# Patient Record
Sex: Female | Born: 1970 | Race: Black or African American | Hispanic: No | Smoking: Never smoker
Health system: Southern US, Community
[De-identification: ages and names within clinical notes are randomized; demographics above are authoritative.]

## PROBLEM LIST (undated history)

## (undated) DIAGNOSIS — D649 Anemia, unspecified: Secondary | ICD-10-CM

## (undated) DIAGNOSIS — I10 Essential (primary) hypertension: Secondary | ICD-10-CM

## (undated) DIAGNOSIS — Z789 Other specified health status: Secondary | ICD-10-CM

---

## 1998-01-02 HISTORY — PX: GASTRIC BYPASS: SHX52

## 2001-01-02 HISTORY — PX: HERNIA REPAIR: SHX51

## 2002-01-02 HISTORY — PX: HERNIA REPAIR: SHX51

## 2002-01-02 HISTORY — PX: VENTRAL HERNIA REPAIR: SHX424

## 2002-08-25 ENCOUNTER — Ambulatory Visit (HOSPITAL_COMMUNITY): Admission: RE | Admit: 2002-08-25 | Discharge: 2002-08-25 | Payer: Self-pay | Admitting: *Deleted

## 2002-08-29 ENCOUNTER — Encounter: Payer: Self-pay | Admitting: *Deleted

## 2002-08-29 ENCOUNTER — Ambulatory Visit (HOSPITAL_COMMUNITY): Admission: RE | Admit: 2002-08-29 | Discharge: 2002-08-29 | Payer: Self-pay | Admitting: *Deleted

## 2002-10-15 ENCOUNTER — Ambulatory Visit (HOSPITAL_COMMUNITY): Admission: RE | Admit: 2002-10-15 | Discharge: 2002-10-15 | Payer: Self-pay | Admitting: Obstetrics & Gynecology

## 2002-10-15 ENCOUNTER — Encounter: Payer: Self-pay | Admitting: Obstetrics & Gynecology

## 2003-12-04 ENCOUNTER — Ambulatory Visit: Payer: Self-pay | Admitting: Oncology

## 2005-09-14 ENCOUNTER — Encounter: Admission: RE | Admit: 2005-09-14 | Discharge: 2005-09-14 | Payer: Self-pay | Admitting: *Deleted

## 2005-11-08 ENCOUNTER — Ambulatory Visit: Payer: Self-pay | Admitting: Oncology

## 2005-11-08 LAB — CBC & DIFF AND RETIC
BASO%: 0.8 % (ref 0.0–2.0)
Basophils Absolute: 0.1 10*3/uL (ref 0.0–0.1)
EOS%: 2.4 % (ref 0.0–7.0)
Eosinophils Absolute: 0.2 10*3/uL (ref 0.0–0.5)
HCT: 28.9 % — ABNORMAL LOW (ref 34.8–46.6)
HGB: 9.3 g/dL — ABNORMAL LOW (ref 11.6–15.9)
IRF: 0.41 — ABNORMAL HIGH (ref 0.130–0.330)
LYMPH%: 33.7 % (ref 14.0–48.0)
MCH: 21.1 pg — ABNORMAL LOW (ref 26.0–34.0)
MCHC: 32.3 g/dL (ref 32.0–36.0)
MCV: 65.2 fL — ABNORMAL LOW (ref 81.0–101.0)
MONO#: 0.7 10*3/uL (ref 0.1–0.9)
MONO%: 9.3 % (ref 0.0–13.0)
NEUT#: 4.2 10*3/uL (ref 1.5–6.5)
NEUT%: 53.8 % (ref 39.6–76.8)
Platelets: 330 10*3/uL (ref 145–400)
RBC: 4.43 10*6/uL (ref 3.70–5.32)
RDW: 18.4 % — ABNORMAL HIGH (ref 11.3–14.5)
RETIC #: 107.6 10*3/uL (ref 19.7–115.1)
Retic %: 2.4 % — ABNORMAL HIGH (ref 0.4–2.3)
WBC: 7.7 10*3/uL (ref 3.9–10.0)
lymph#: 2.6 10*3/uL (ref 0.9–3.3)

## 2005-11-09 LAB — COMPREHENSIVE METABOLIC PANEL
ALT: 17 U/L (ref 0–35)
AST: 18 U/L (ref 0–37)
Albumin: 4.2 g/dL (ref 3.5–5.2)
Alkaline Phosphatase: 120 U/L — ABNORMAL HIGH (ref 39–117)
BUN: 10 mg/dL (ref 6–23)
CO2: 24 mEq/L (ref 19–32)
Calcium: 8.3 mg/dL — ABNORMAL LOW (ref 8.4–10.5)
Chloride: 106 mEq/L (ref 96–112)
Creatinine, Ser: 0.75 mg/dL (ref 0.40–1.20)
Glucose, Bld: 86 mg/dL (ref 70–99)
Potassium: 4.4 mEq/L (ref 3.5–5.3)
Sodium: 137 mEq/L (ref 135–145)
Total Bilirubin: 0.3 mg/dL (ref 0.3–1.2)
Total Protein: 7.3 g/dL (ref 6.0–8.3)

## 2005-11-09 LAB — FERRITIN: Ferritin: 3 ng/mL — ABNORMAL LOW (ref 10–291)

## 2005-11-09 LAB — LACTATE DEHYDROGENASE: LDH: 169 U/L (ref 94–250)

## 2005-11-09 LAB — VITAMIN B12: Vitamin B-12: 602 pg/mL (ref 211–911)

## 2005-11-09 LAB — FOLATE RBC: RBC Folate: 400 ng/mL (ref 180–600)

## 2006-02-02 ENCOUNTER — Ambulatory Visit: Payer: Self-pay | Admitting: Oncology

## 2006-04-20 ENCOUNTER — Ambulatory Visit: Payer: Self-pay | Admitting: Oncology

## 2006-04-25 LAB — FERRITIN: Ferritin: 6 ng/mL — ABNORMAL LOW (ref 10–291)

## 2006-04-25 LAB — CBC WITH DIFFERENTIAL/PLATELET
BASO%: 0.6 % (ref 0.0–2.0)
Basophils Absolute: 0 10*3/uL (ref 0.0–0.1)
EOS%: 4.7 % (ref 0.0–7.0)
Eosinophils Absolute: 0.3 10*3/uL (ref 0.0–0.5)
HCT: 32.4 % — ABNORMAL LOW (ref 34.8–46.6)
HGB: 11.2 g/dL — ABNORMAL LOW (ref 11.6–15.9)
LYMPH%: 32.1 % (ref 14.0–48.0)
MCH: 25.7 pg — ABNORMAL LOW (ref 26.0–34.0)
MCHC: 34.5 g/dL (ref 32.0–36.0)
MCV: 74.5 fL — ABNORMAL LOW (ref 81.0–101.0)
MONO#: 0.7 10*3/uL (ref 0.1–0.9)
MONO%: 10.2 % (ref 0.0–13.0)
NEUT#: 3.8 10*3/uL (ref 1.5–6.5)
NEUT%: 52.4 % (ref 39.6–76.8)
Platelets: 250 10*3/uL (ref 145–400)
RBC: 4.36 10*6/uL (ref 3.70–5.32)
RDW: 16.3 % — ABNORMAL HIGH (ref 11.3–14.5)
WBC: 7.3 10*3/uL (ref 3.9–10.0)
lymph#: 2.3 10*3/uL (ref 0.9–3.3)

## 2006-08-09 ENCOUNTER — Ambulatory Visit: Payer: Self-pay | Admitting: Oncology

## 2006-08-13 LAB — CBC WITH DIFFERENTIAL/PLATELET
BASO%: 0.8 % (ref 0.0–2.0)
Basophils Absolute: 0.1 10*3/uL (ref 0.0–0.1)
EOS%: 3.5 % (ref 0.0–7.0)
Eosinophils Absolute: 0.3 10*3/uL (ref 0.0–0.5)
HCT: 36.5 % (ref 34.8–46.6)
HGB: 12.9 g/dL (ref 11.6–15.9)
LYMPH%: 23 % (ref 14.0–48.0)
MCH: 28 pg (ref 26.0–34.0)
MCHC: 35.4 g/dL (ref 32.0–36.0)
MCV: 79.2 fL — ABNORMAL LOW (ref 81.0–101.0)
MONO#: 0.6 10*3/uL (ref 0.1–0.9)
MONO%: 6.7 % (ref 0.0–13.0)
NEUT#: 5.8 10*3/uL (ref 1.5–6.5)
NEUT%: 66 % (ref 39.6–76.8)
Platelets: 251 10*3/uL (ref 145–400)
RBC: 4.61 10*6/uL (ref 3.70–5.32)
RDW: 17.3 % — ABNORMAL HIGH (ref 11.3–14.5)
WBC: 8.8 10*3/uL (ref 3.9–10.0)
lymph#: 2 10*3/uL (ref 0.9–3.3)

## 2006-08-13 LAB — FERRITIN: Ferritin: 39 ng/mL (ref 10–291)

## 2006-11-05 ENCOUNTER — Ambulatory Visit: Payer: Self-pay | Admitting: Oncology

## 2006-12-20 LAB — CBC WITH DIFFERENTIAL/PLATELET
BASO%: 0.3 % (ref 0.0–2.0)
Basophils Absolute: 0 10*3/uL (ref 0.0–0.1)
EOS%: 4.2 % (ref 0.0–7.0)
Eosinophils Absolute: 0.3 10*3/uL (ref 0.0–0.5)
HCT: 36.1 % (ref 34.8–46.6)
HGB: 12.6 g/dL (ref 11.6–15.9)
LYMPH%: 29.3 % (ref 14.0–48.0)
MCH: 28.2 pg (ref 26.0–34.0)
MCHC: 35 g/dL (ref 32.0–36.0)
MCV: 80.8 fL — ABNORMAL LOW (ref 81.0–101.0)
MONO#: 0.4 10*3/uL (ref 0.1–0.9)
MONO%: 6 % (ref 0.0–13.0)
NEUT#: 3.8 10*3/uL (ref 1.5–6.5)
NEUT%: 60.2 % (ref 39.6–76.8)
Platelets: 247 10*3/uL (ref 145–400)
RBC: 4.47 10*6/uL (ref 3.70–5.32)
RDW: 14.3 % (ref 11.3–14.5)
WBC: 6.2 10*3/uL (ref 3.9–10.0)
lymph#: 1.8 10*3/uL (ref 0.9–3.3)

## 2006-12-20 LAB — FERRITIN: Ferritin: 21 ng/mL (ref 10–291)

## 2007-02-01 ENCOUNTER — Ambulatory Visit: Payer: Self-pay | Admitting: Oncology

## 2007-05-01 ENCOUNTER — Ambulatory Visit: Payer: Self-pay | Admitting: Oncology

## 2007-05-06 LAB — CBC & DIFF AND RETIC
BASO%: 1.7 % (ref 0.0–2.0)
Basophils Absolute: 0.1 10*3/uL (ref 0.0–0.1)
EOS%: 4.5 % (ref 0.0–7.0)
Eosinophils Absolute: 0.4 10*3/uL (ref 0.0–0.5)
HCT: 36.2 % (ref 34.8–46.6)
HGB: 12.3 g/dL (ref 11.6–15.9)
LYMPH%: 29.5 % (ref 14.0–48.0)
MCH: 26.4 pg (ref 26.0–34.0)
MCHC: 34.1 g/dL (ref 32.0–36.0)
MCV: 77.4 fL — ABNORMAL LOW (ref 81.0–101.0)
MONO#: 0.7 10*3/uL (ref 0.1–0.9)
MONO%: 8 % (ref 0.0–13.0)
NEUT#: 4.8 10*3/uL (ref 1.5–6.5)
NEUT%: 56.2 % (ref 39.6–76.8)
Platelets: 251 10*3/uL (ref 145–400)
RBC: 4.67 10*6/uL (ref 3.70–5.32)
RDW: 14.9 % — ABNORMAL HIGH (ref 11.3–14.5)
WBC: 8.5 10*3/uL (ref 3.9–10.0)
lymph#: 2.5 10*3/uL (ref 0.9–3.3)

## 2007-05-06 LAB — RETICULOCYTES (CHCC)
ABS Retic: 82.6 10*3/uL (ref 19.0–186.0)
RBC.: 4.86 MIL/uL (ref 3.87–5.11)
Retic Ct Pct: 1.7 % (ref 0.4–3.1)

## 2007-05-06 LAB — FERRITIN: Ferritin: 9 ng/mL — ABNORMAL LOW (ref 10–291)

## 2007-07-28 ENCOUNTER — Ambulatory Visit: Payer: Self-pay | Admitting: Oncology

## 2007-08-30 LAB — FERRITIN: Ferritin: 52 ng/mL (ref 10–291)

## 2007-08-30 LAB — CBC & DIFF AND RETIC
BASO%: 0.5 % (ref 0.0–2.0)
Basophils Absolute: 0 10*3/uL (ref 0.0–0.1)
EOS%: 5 % (ref 0.0–7.0)
Eosinophils Absolute: 0.3 10*3/uL (ref 0.0–0.5)
HCT: 38.8 % (ref 34.8–46.6)
HGB: 13.6 g/dL (ref 11.6–15.9)
IRF: 0.4 — ABNORMAL HIGH (ref 0.130–0.330)
LYMPH%: 30.1 % (ref 14.0–48.0)
MCH: 29.1 pg (ref 26.0–34.0)
MCHC: 35 g/dL (ref 32.0–36.0)
MCV: 83.1 fL (ref 81.0–101.0)
MONO#: 0.6 10*3/uL (ref 0.1–0.9)
MONO%: 8.8 % (ref 0.0–13.0)
NEUT#: 3.6 10*3/uL (ref 1.5–6.5)
NEUT%: 55.6 % (ref 39.6–76.8)
Platelets: 235 10*3/uL (ref 145–400)
RBC: 4.67 10*6/uL (ref 3.70–5.32)
RDW: 15.2 % — ABNORMAL HIGH (ref 11.3–14.5)
RETIC #: 126.6 10*3/uL — ABNORMAL HIGH (ref 19.7–115.1)
Retic %: 2.7 % — ABNORMAL HIGH (ref 0.4–2.3)
WBC: 6.5 10*3/uL (ref 3.9–10.0)
lymph#: 1.9 10*3/uL (ref 0.9–3.3)

## 2007-11-01 ENCOUNTER — Ambulatory Visit: Payer: Self-pay | Admitting: Oncology

## 2007-11-05 LAB — CBC & DIFF AND RETIC
BASO%: 0.4 % (ref 0.0–2.0)
Basophils Absolute: 0 10*3/uL (ref 0.0–0.1)
EOS%: 3.2 % (ref 0.0–7.0)
Eosinophils Absolute: 0.2 10*3/uL (ref 0.0–0.5)
HCT: 38.1 % (ref 34.8–46.6)
HGB: 13.1 g/dL (ref 11.6–15.9)
IRF: 0.44 — ABNORMAL HIGH (ref 0.130–0.330)
LYMPH%: 27.2 % (ref 14.0–48.0)
MCH: 29.1 pg (ref 26.0–34.0)
MCHC: 34.4 g/dL (ref 32.0–36.0)
MCV: 84.5 fL (ref 81.0–101.0)
MONO#: 0.7 10*3/uL (ref 0.1–0.9)
MONO%: 8.9 % (ref 0.0–13.0)
NEUT#: 4.7 10*3/uL (ref 1.5–6.5)
NEUT%: 60.3 % (ref 39.6–76.8)
Platelets: 236 10*3/uL (ref 145–400)
RBC: 4.51 10*6/uL (ref 3.70–5.32)
RDW: 14.8 % — ABNORMAL HIGH (ref 11.3–14.5)
RETIC #: 138 10*3/uL — ABNORMAL HIGH (ref 19.7–115.1)
Retic %: 3.1 % — ABNORMAL HIGH (ref 0.4–2.3)
WBC: 7.9 10*3/uL (ref 3.9–10.0)
lymph#: 2.1 10*3/uL (ref 0.9–3.3)

## 2007-11-05 LAB — FERRITIN: Ferritin: 24 ng/mL (ref 10–291)

## 2008-02-05 ENCOUNTER — Ambulatory Visit: Payer: Self-pay | Admitting: Oncology

## 2008-05-08 ENCOUNTER — Ambulatory Visit: Payer: Self-pay | Admitting: Oncology

## 2008-05-12 LAB — CBC & DIFF AND RETIC
BASO%: 0.4 % (ref 0.0–2.0)
Basophils Absolute: 0 10*3/uL (ref 0.0–0.1)
EOS%: 3 % (ref 0.0–7.0)
Eosinophils Absolute: 0.2 10*3/uL (ref 0.0–0.5)
HCT: 36.8 % (ref 34.8–46.6)
HGB: 12.5 g/dL (ref 11.6–15.9)
IRF: 0.41 — ABNORMAL HIGH (ref 0.130–0.330)
LYMPH%: 31.3 % (ref 14.0–49.7)
MCH: 27.1 pg (ref 25.1–34.0)
MCHC: 33.9 g/dL (ref 31.5–36.0)
MCV: 79.9 fL (ref 79.5–101.0)
MONO#: 0.6 10*3/uL (ref 0.1–0.9)
MONO%: 9.8 % (ref 0.0–14.0)
NEUT#: 3.5 10*3/uL (ref 1.5–6.5)
NEUT%: 55.5 % (ref 38.4–76.8)
Platelets: 224 10*3/uL (ref 145–400)
RBC: 4.61 10*6/uL (ref 3.70–5.45)
RDW: 15.9 % — ABNORMAL HIGH (ref 11.2–14.5)
RETIC #: 136.5 10*3/uL — ABNORMAL HIGH (ref 19.7–115.1)
Retic %: 3 % — ABNORMAL HIGH (ref 0.4–2.3)
WBC: 6.4 10*3/uL (ref 3.9–10.3)
lymph#: 2 10*3/uL (ref 0.9–3.3)

## 2008-05-12 LAB — FERRITIN: Ferritin: 11 ng/mL (ref 10–291)

## 2008-08-07 ENCOUNTER — Ambulatory Visit: Payer: Self-pay | Admitting: Oncology

## 2008-08-11 LAB — CBC & DIFF AND RETIC
BASO%: 0.5 % (ref 0.0–2.0)
Basophils Absolute: 0 10*3/uL (ref 0.0–0.1)
EOS%: 4.5 % (ref 0.0–7.0)
Eosinophils Absolute: 0.3 10*3/uL (ref 0.0–0.5)
HCT: 37.1 % (ref 34.8–46.6)
HGB: 12.8 g/dL (ref 11.6–15.9)
Immature Retic Fract: 14.1 % — ABNORMAL HIGH (ref 0.00–10.70)
LYMPH%: 33.7 % (ref 14.0–49.7)
MCH: 28.3 pg (ref 25.1–34.0)
MCHC: 34.5 g/dL (ref 31.5–36.0)
MCV: 81.9 fL (ref 79.5–101.0)
MONO#: 0.5 10*3/uL (ref 0.1–0.9)
MONO%: 7.7 % (ref 0.0–14.0)
NEUT#: 3.6 10*3/uL (ref 1.5–6.5)
NEUT%: 53.6 % (ref 38.4–76.8)
Platelets: 206 10*3/uL (ref 145–400)
RBC: 4.53 10*6/uL (ref 3.70–5.45)
RDW: 17.1 % — ABNORMAL HIGH (ref 11.2–14.5)
Retic %: 2.13 % — ABNORMAL HIGH (ref 0.50–1.50)
Retic Ct Abs: 96.49 10*3/uL — ABNORMAL HIGH (ref 18.30–72.70)
WBC: 6.6 10*3/uL (ref 3.9–10.3)
lymph#: 2.2 10*3/uL (ref 0.9–3.3)

## 2008-08-11 LAB — FERRITIN: Ferritin: 66 ng/mL (ref 10–291)

## 2008-11-06 ENCOUNTER — Ambulatory Visit: Payer: Self-pay | Admitting: Oncology

## 2008-11-10 LAB — CBC & DIFF AND RETIC
BASO%: 0.5 % (ref 0.0–2.0)
Basophils Absolute: 0 10*3/uL (ref 0.0–0.1)
EOS%: 3.8 % (ref 0.0–7.0)
Eosinophils Absolute: 0.3 10*3/uL (ref 0.0–0.5)
HCT: 37.7 % (ref 34.8–46.6)
HGB: 12.7 g/dL (ref 11.6–15.9)
Immature Retic Fract: 8.1 % (ref 0.00–10.70)
LYMPH%: 30.9 % (ref 14.0–49.7)
MCH: 29.6 pg (ref 25.1–34.0)
MCHC: 33.7 g/dL (ref 31.5–36.0)
MCV: 87.9 fL (ref 79.5–101.0)
MONO#: 0.5 10*3/uL (ref 0.1–0.9)
MONO%: 7.1 % (ref 0.0–14.0)
NEUT#: 4.2 10*3/uL (ref 1.5–6.5)
NEUT%: 57.7 % (ref 38.4–76.8)
Platelets: 236 10*3/uL (ref 145–400)
RBC: 4.29 10*6/uL (ref 3.70–5.45)
RDW: 14.3 % (ref 11.2–14.5)
Retic %: 2 % — ABNORMAL HIGH (ref 0.50–1.50)
Retic Ct Abs: 85.8 10*3/uL — ABNORMAL HIGH (ref 18.30–72.70)
WBC: 7.3 10*3/uL (ref 3.9–10.3)
lymph#: 2.3 10*3/uL (ref 0.9–3.3)

## 2008-11-10 LAB — FERRITIN: Ferritin: 72 ng/mL (ref 10–291)

## 2009-02-05 ENCOUNTER — Ambulatory Visit: Payer: Self-pay | Admitting: Oncology

## 2009-02-09 LAB — CBC & DIFF AND RETIC
BASO%: 0.4 % (ref 0.0–2.0)
Basophils Absolute: 0 10*3/uL (ref 0.0–0.1)
EOS%: 2 % (ref 0.0–7.0)
Eosinophils Absolute: 0.1 10*3/uL (ref 0.0–0.5)
HCT: 37.8 % (ref 34.8–46.6)
HGB: 12.7 g/dL (ref 11.6–15.9)
Immature Retic Fract: 14 % — ABNORMAL HIGH (ref 0.00–10.70)
LYMPH%: 31.3 % (ref 14.0–49.7)
MCH: 28.5 pg (ref 25.1–34.0)
MCHC: 33.6 g/dL (ref 31.5–36.0)
MCV: 84.8 fL (ref 79.5–101.0)
MONO#: 0.5 10*3/uL (ref 0.1–0.9)
MONO%: 7.5 % (ref 0.0–14.0)
NEUT#: 4.2 10*3/uL (ref 1.5–6.5)
NEUT%: 58.8 % (ref 38.4–76.8)
Platelets: 223 10*3/uL (ref 145–400)
RBC: 4.46 10*6/uL (ref 3.70–5.45)
RDW: 14.7 % — ABNORMAL HIGH (ref 11.2–14.5)
Retic %: 1.71 % — ABNORMAL HIGH (ref 0.50–1.50)
Retic Ct Abs: 76.27 10*3/uL — ABNORMAL HIGH (ref 18.30–72.70)
WBC: 7.1 10*3/uL (ref 3.9–10.3)
lymph#: 2.2 10*3/uL (ref 0.9–3.3)

## 2009-02-09 LAB — FERRITIN: Ferritin: 20 ng/mL (ref 10–291)

## 2009-06-01 ENCOUNTER — Ambulatory Visit: Payer: Self-pay | Admitting: Oncology

## 2009-06-01 LAB — CBC WITH DIFFERENTIAL/PLATELET
BASO%: 0.5 % (ref 0.0–2.0)
Basophils Absolute: 0 10*3/uL (ref 0.0–0.1)
EOS%: 2.6 % (ref 0.0–7.0)
Eosinophils Absolute: 0.2 10*3/uL (ref 0.0–0.5)
HCT: 40.7 % (ref 34.8–46.6)
HGB: 14.1 g/dL (ref 11.6–15.9)
LYMPH%: 34.1 % (ref 14.0–49.7)
MCH: 28.4 pg (ref 25.1–34.0)
MCHC: 34.6 g/dL (ref 31.5–36.0)
MCV: 82 fL (ref 79.5–101.0)
MONO#: 0.5 10*3/uL (ref 0.1–0.9)
MONO%: 6.6 % (ref 0.0–14.0)
NEUT#: 4 10*3/uL (ref 1.5–6.5)
NEUT%: 56.2 % (ref 38.4–76.8)
Platelets: 293 10*3/uL (ref 145–400)
RBC: 4.97 10*6/uL (ref 3.70–5.45)
RDW: 16.5 % — ABNORMAL HIGH (ref 11.2–14.5)
WBC: 7 10*3/uL (ref 3.9–10.3)
lymph#: 2.4 10*3/uL (ref 0.9–3.3)

## 2009-06-01 LAB — FERRITIN: Ferritin: 17 ng/mL (ref 10–291)

## 2009-09-08 ENCOUNTER — Ambulatory Visit: Payer: Self-pay | Admitting: Oncology

## 2009-09-08 LAB — CBC & DIFF AND RETIC
BASO%: 0.4 % (ref 0.0–2.0)
Basophils Absolute: 0 10*3/uL (ref 0.0–0.1)
EOS%: 2.6 % (ref 0.0–7.0)
Eosinophils Absolute: 0.2 10*3/uL (ref 0.0–0.5)
HCT: 37.1 % (ref 34.8–46.6)
HGB: 12.4 g/dL (ref 11.6–15.9)
Immature Retic Fract: 11.8 % — ABNORMAL HIGH (ref 0.00–10.70)
LYMPH%: 37.1 % (ref 14.0–49.7)
MCH: 27.4 pg (ref 25.1–34.0)
MCHC: 33.4 g/dL (ref 31.5–36.0)
MCV: 81.9 fL (ref 79.5–101.0)
MONO#: 0.7 10*3/uL (ref 0.1–0.9)
MONO%: 10.4 % (ref 0.0–14.0)
NEUT#: 3.4 10*3/uL (ref 1.5–6.5)
NEUT%: 49.5 % (ref 38.4–76.8)
Platelets: 217 10*3/uL (ref 145–400)
RBC: 4.53 10*6/uL (ref 3.70–5.45)
RDW: 15.3 % — ABNORMAL HIGH (ref 11.2–14.5)
Retic %: 1.78 % — ABNORMAL HIGH (ref 0.50–1.50)
Retic Ct Abs: 80.63 10*3/uL — ABNORMAL HIGH (ref 18.30–72.70)
WBC: 7 10*3/uL (ref 3.9–10.3)
lymph#: 2.6 10*3/uL (ref 0.9–3.3)

## 2009-09-08 LAB — FERRITIN: Ferritin: 7 ng/mL — ABNORMAL LOW (ref 10–291)

## 2009-12-06 ENCOUNTER — Ambulatory Visit: Payer: Self-pay | Admitting: Oncology

## 2009-12-07 LAB — CBC & DIFF AND RETIC
BASO%: 0.5 % (ref 0.0–2.0)
Basophils Absolute: 0 10*3/uL (ref 0.0–0.1)
EOS%: 3.5 % (ref 0.0–7.0)
Eosinophils Absolute: 0.2 10*3/uL (ref 0.0–0.5)
HCT: 36.3 % (ref 34.8–46.6)
HGB: 12.6 g/dL (ref 11.6–15.9)
Immature Retic Fract: 6.2 % (ref 0.00–10.70)
LYMPH%: 35.8 % (ref 14.0–49.7)
MCH: 29.6 pg (ref 25.1–34.0)
MCHC: 34.7 g/dL (ref 31.5–36.0)
MCV: 85.4 fL (ref 79.5–101.0)
MONO#: 0.7 10*3/uL (ref 0.1–0.9)
MONO%: 11.5 % (ref 0.0–14.0)
NEUT#: 2.8 10*3/uL (ref 1.5–6.5)
NEUT%: 48.7 % (ref 38.4–76.8)
Platelets: 216 10*3/uL (ref 145–400)
RBC: 4.25 10*6/uL (ref 3.70–5.45)
RDW: 16.9 % — ABNORMAL HIGH (ref 11.2–14.5)
Retic %: 1.72 % — ABNORMAL HIGH (ref 0.50–1.50)
Retic Ct Abs: 73.1 10*3/uL — ABNORMAL HIGH (ref 18.30–72.70)
WBC: 5.7 10*3/uL (ref 3.9–10.3)
lymph#: 2.1 10*3/uL (ref 0.9–3.3)
nRBC: 0 % (ref 0–0)

## 2009-12-07 LAB — FERRITIN: Ferritin: 71 ng/mL (ref 10–291)

## 2010-01-02 NOTE — L&D Delivery Note (Signed)
Delivery Note At 6:02 AM a viable and healthy female was delivered via Vaginal, Spontaneous Delivery (Presentation: Left Occiput Anterior).  APGAR: 8, 9; weight 5 lb 13.5 oz (2650 g).   Placenta status: Intact, Spontaneous.  Cord: 3 vessels with the following complications: None.  Anesthesia: Epidural  Episiotomy: None Lacerations: 1st degree Suture Repair:  Est. Blood Loss (mL):   Mom to .  Baby to nursery-stable.  Shailyn Weyandt J 07/11/2010, 6:25 AM   Suture repair with 2-0 rapide. EBL: 500cc No complications. Mother to [facility], stable.

## 2010-01-23 ENCOUNTER — Encounter: Payer: Self-pay | Admitting: Gastroenterology

## 2010-03-18 ENCOUNTER — Other Ambulatory Visit: Payer: Self-pay | Admitting: Oncology

## 2010-03-18 ENCOUNTER — Encounter (HOSPITAL_BASED_OUTPATIENT_CLINIC_OR_DEPARTMENT_OTHER): Payer: BC Managed Care – PPO | Admitting: Oncology

## 2010-03-18 DIAGNOSIS — D509 Iron deficiency anemia, unspecified: Secondary | ICD-10-CM

## 2010-03-18 LAB — CBC & DIFF AND RETIC
BASO%: 0.3 % (ref 0.0–2.0)
Basophils Absolute: 0 10*3/uL (ref 0.0–0.1)
EOS%: 1.9 % (ref 0.0–7.0)
Eosinophils Absolute: 0.1 10*3/uL (ref 0.0–0.5)
HCT: 32.4 % — ABNORMAL LOW (ref 34.8–46.6)
HGB: 11.2 g/dL — ABNORMAL LOW (ref 11.6–15.9)
Immature Retic Fract: 11.4 % — ABNORMAL HIGH (ref 0.00–10.70)
LYMPH%: 23.7 % (ref 14.0–49.7)
MCH: 27.5 pg (ref 25.1–34.0)
MCHC: 34.6 g/dL (ref 31.5–36.0)
MCV: 79.4 fL — ABNORMAL LOW (ref 79.5–101.0)
MONO#: 0.6 10*3/uL (ref 0.1–0.9)
MONO%: 7.7 % (ref 0.0–14.0)
NEUT#: 5 10*3/uL (ref 1.5–6.5)
NEUT%: 66.4 % (ref 38.4–76.8)
Platelets: 185 10*3/uL (ref 145–400)
RBC: 4.08 10*6/uL (ref 3.70–5.45)
RDW: 13.6 % (ref 11.2–14.5)
Retic %: 2.08 % — ABNORMAL HIGH (ref 0.50–1.50)
Retic Ct Abs: 84.86 10*3/uL — ABNORMAL HIGH (ref 18.30–72.70)
WBC: 7.6 10*3/uL (ref 3.9–10.3)
lymph#: 1.8 10*3/uL (ref 0.9–3.3)

## 2010-03-18 LAB — FERRITIN: Ferritin: 56 ng/mL (ref 10–291)

## 2010-06-08 ENCOUNTER — Encounter (HOSPITAL_BASED_OUTPATIENT_CLINIC_OR_DEPARTMENT_OTHER): Payer: BC Managed Care – PPO | Admitting: Oncology

## 2010-06-08 ENCOUNTER — Other Ambulatory Visit: Payer: Self-pay | Admitting: Oncology

## 2010-06-08 DIAGNOSIS — D509 Iron deficiency anemia, unspecified: Secondary | ICD-10-CM

## 2010-06-08 LAB — CBC & DIFF AND RETIC
BASO%: 0.1 % (ref 0.0–2.0)
Basophils Absolute: 0 10*3/uL (ref 0.0–0.1)
EOS%: 1.6 % (ref 0.0–7.0)
Eosinophils Absolute: 0.1 10*3/uL (ref 0.0–0.5)
HCT: 33 % — ABNORMAL LOW (ref 34.8–46.6)
HGB: 11.3 g/dL — ABNORMAL LOW (ref 11.6–15.9)
Immature Retic Fract: 9.6 % (ref 0.00–10.70)
LYMPH%: 21.9 % (ref 14.0–49.7)
MCH: 26.4 pg (ref 25.1–34.0)
MCHC: 34.2 g/dL (ref 31.5–36.0)
MCV: 77.1 fL — ABNORMAL LOW (ref 79.5–101.0)
MONO#: 0.7 10*3/uL (ref 0.1–0.9)
MONO%: 8.3 % (ref 0.0–14.0)
NEUT#: 5.4 10*3/uL (ref 1.5–6.5)
NEUT%: 68.1 % (ref 38.4–76.8)
Platelets: 179 10*3/uL (ref 145–400)
RBC: 4.28 10*6/uL (ref 3.70–5.45)
RDW: 14.6 % — ABNORMAL HIGH (ref 11.2–14.5)
Retic %: 1.72 % — ABNORMAL HIGH (ref 0.50–1.50)
Retic Ct Abs: 73.62 10*3/uL — ABNORMAL HIGH (ref 18.30–72.70)
WBC: 7.9 10*3/uL (ref 3.9–10.3)
lymph#: 1.7 10*3/uL (ref 0.9–3.3)

## 2010-06-08 LAB — FERRITIN: Ferritin: 34 ng/mL (ref 10–291)

## 2010-06-14 ENCOUNTER — Encounter (HOSPITAL_BASED_OUTPATIENT_CLINIC_OR_DEPARTMENT_OTHER): Payer: BC Managed Care – PPO | Admitting: Oncology

## 2010-06-14 DIAGNOSIS — D509 Iron deficiency anemia, unspecified: Secondary | ICD-10-CM

## 2010-07-01 LAB — STREP B DNA PROBE: GBS: NEGATIVE

## 2010-07-10 ENCOUNTER — Encounter (HOSPITAL_COMMUNITY): Payer: Self-pay

## 2010-07-10 ENCOUNTER — Inpatient Hospital Stay (HOSPITAL_COMMUNITY)
Admission: AD | Admit: 2010-07-10 | Discharge: 2010-07-10 | Disposition: A | Discharge status: Home / Self Care | Payer: BC Managed Care – PPO | Source: Ambulatory Visit | Attending: Obstetrics & Gynecology | Admitting: Obstetrics & Gynecology

## 2010-07-10 ENCOUNTER — Inpatient Hospital Stay (HOSPITAL_COMMUNITY)
Admission: AD | Admit: 2010-07-10 | Discharge: 2010-07-13 | DRG: 373 | Disposition: A | Discharge status: Home / Self Care | Payer: BC Managed Care – PPO | Source: Ambulatory Visit | Attending: Obstetrics and Gynecology | Admitting: Obstetrics and Gynecology

## 2010-07-10 DIAGNOSIS — O99844 Bariatric surgery status complicating childbirth: Secondary | ICD-10-CM | POA: Diagnosis present

## 2010-07-10 DIAGNOSIS — R109 Unspecified abdominal pain: Secondary | ICD-10-CM | POA: Insufficient documentation

## 2010-07-10 DIAGNOSIS — O09529 Supervision of elderly multigravida, unspecified trimester: Secondary | ICD-10-CM | POA: Diagnosis present

## 2010-07-10 DIAGNOSIS — O4700 False labor before 37 completed weeks of gestation, unspecified trimester: Secondary | ICD-10-CM | POA: Insufficient documentation

## 2010-07-10 DIAGNOSIS — N949 Unspecified condition associated with female genital organs and menstrual cycle: Secondary | ICD-10-CM | POA: Insufficient documentation

## 2010-07-10 DIAGNOSIS — O4703 False labor before 37 completed weeks of gestation, third trimester: Secondary | ICD-10-CM

## 2010-07-10 HISTORY — DX: Other specified health status: Z78.9

## 2010-07-10 LAB — URINALYSIS, ROUTINE W REFLEX MICROSCOPIC
Bilirubin Urine: NEGATIVE
Glucose, UA: NEGATIVE mg/dL
Ketones, ur: NEGATIVE mg/dL
Nitrite: NEGATIVE
Protein, ur: NEGATIVE mg/dL
Specific Gravity, Urine: 1.01 (ref 1.005–1.030)
Urobilinogen, UA: 0.2 mg/dL (ref 0.0–1.0)
pH: 6 (ref 5.0–8.0)

## 2010-07-10 LAB — SICKLE CELL SCREEN

## 2010-07-10 LAB — CBC
HCT: 34.6 % — ABNORMAL LOW (ref 36.0–46.0)
Hemoglobin: 11.7 g/dL — ABNORMAL LOW (ref 12.0–15.0)
MCH: 26.7 pg (ref 26.0–34.0)
MCHC: 33.8 g/dL (ref 30.0–36.0)
MCV: 79 fL (ref 78.0–100.0)
Platelets: 173 10*3/uL (ref 150–400)
RBC: 4.38 MIL/uL (ref 3.87–5.11)
RDW: 15.3 % (ref 11.5–15.5)
WBC: 9 10*3/uL (ref 4.0–10.5)

## 2010-07-10 LAB — URINE MICROSCOPIC-ADD ON

## 2010-07-10 LAB — RUBELLA ANTIBODY, IGM: Rubella: IMMUNE

## 2010-07-10 LAB — HEPATITIS B SURFACE ANTIGEN: Hepatitis B Surface Ag: NEGATIVE

## 2010-07-10 LAB — HIV ANTIBODY (ROUTINE TESTING W REFLEX): HIV: NONREACTIVE

## 2010-07-10 LAB — RPR: RPR: NONREACTIVE

## 2010-07-10 LAB — TYPE AND SCREEN: Antibody Screen: NEGATIVE

## 2010-07-10 LAB — ABO/RH: RH Type: POSITIVE

## 2010-07-10 MED ORDER — FLEET ENEMA 7-19 GM/118ML RE ENEM: 1.0000 | ENEMA | RECTAL | Status: DC | PRN

## 2010-07-10 MED ORDER — TERBUTALINE SULFATE 1 MG/ML IJ SOLN: 0.2500 mg | Freq: Once | INTRAMUSCULAR | Status: AC | PRN

## 2010-07-10 MED ORDER — CEFAZOLIN SODIUM 1-5 GM-% IV SOLN
1.0000 g | Freq: Three times a day (TID) | INTRAVENOUS | Status: DC
  Administered 2010-07-11: 1 g via INTRAVENOUS
  Filled 2010-07-10 (×2): qty 50

## 2010-07-10 MED ORDER — OXYTOCIN 20 UNITS IN LACTATED RINGERS INFUSION - SIMPLE
1.0000 m[IU]/min | INTRAVENOUS | Status: DC
  Administered 2010-07-10: 2 m[IU]/min via INTRAVENOUS
  Administered 2010-07-10: 3 m[IU]/min via INTRAVENOUS
  Administered 2010-07-10: 1 m[IU]/min via INTRAVENOUS
  Administered 2010-07-11: 6 m[IU]/min via INTRAVENOUS
  Administered 2010-07-11: 4 m[IU]/min via INTRAVENOUS
  Administered 2010-07-11: 5 m[IU]/min via INTRAVENOUS
  Filled 2010-07-10: qty 1000

## 2010-07-10 MED ORDER — OXYTOCIN 20 UNITS IN LACTATED RINGERS INFUSION - SIMPLE: 125.0000 mL/h | Freq: Once | INTRAVENOUS | Status: DC

## 2010-07-10 MED ORDER — IBUPROFEN 600 MG PO TABS
600.0000 mg | ORAL_TABLET | Freq: Four times a day (QID) | ORAL | Status: DC | PRN
  Administered 2010-07-11: 600 mg via ORAL
  Filled 2010-07-10: qty 1

## 2010-07-10 MED ORDER — ONDANSETRON HCL 4 MG/2ML IJ SOLN: 4.0000 mg | Freq: Four times a day (QID) | INTRAMUSCULAR | Status: DC | PRN

## 2010-07-10 MED ORDER — LACTATED RINGERS IV SOLN
INTRAVENOUS | Status: DC
  Administered 2010-07-11: 02:00:00 via INTRAVENOUS

## 2010-07-10 MED ORDER — ACETAMINOPHEN 325 MG PO TABS: 650.0000 mg | ORAL_TABLET | ORAL | Status: DC | PRN

## 2010-07-10 MED ORDER — LACTATED RINGERS IV SOLN: 500.0000 mL | Freq: Once | INTRAVENOUS | Status: AC | PRN

## 2010-07-10 MED ORDER — CITRIC ACID-SODIUM CITRATE 334-500 MG/5ML PO SOLN: 30.0000 mL | ORAL | Status: DC | PRN

## 2010-07-10 MED ORDER — ZOLPIDEM TARTRATE 10 MG PO TABS: 10.0000 mg | ORAL_TABLET | Freq: Every evening | ORAL | Status: DC | PRN

## 2010-07-10 MED ORDER — CEFAZOLIN SODIUM-DEXTROSE 2-3 GM-% IV SOLR
2.0000 g | Freq: Once | INTRAVENOUS | Status: AC
  Administered 2010-07-10: 2 g via INTRAVENOUS
  Filled 2010-07-10: qty 50

## 2010-07-10 MED ORDER — LIDOCAINE HCL (PF) 1 % IJ SOLN
30.0000 mL | Freq: Once | INTRAMUSCULAR | Status: AC | PRN
  Filled 2010-07-10: qty 30

## 2010-07-10 NOTE — Progress Notes (Signed)
With Lamont at bedside

## 2010-07-10 NOTE — ED Notes (Signed)
History  Charlene [name] 40 y.o. G3P1011  36+ wks      Chief Complaint  Patient presents with  . Abdominal Pain  . Vaginal Discharge    bloody vaginal discharge some on Monday, some again yesterday pink, this morning red brown in color        UCs, no Fluid leak, FMs pos.  OB History    Grav Para Term Preterm Abortions TAB SAB Ect Mult Living   3 1 1  1     1       Past Medical History  Diagnosis Date  . No pertinent past medical history     Past Surgical History  Procedure Date  . Gastric bypass 2000    Family History  Problem Relation Age of Onset  . Hypertension Mother   . Heart disease Father   . Hypertension Father     History  Substance Use Topics  . Smoking status: Never Smoker   . Smokeless tobacco: Not on file  . Alcohol Use: Yes     social drinker    Allergies:  Allergies  Allergen Reactions  . Penicillins Hives    Heart rate increases    Prescriptions prior to admission  Medication Sig Dispense Refill  . Cholecalciferol (VITAMIN D) 2000 UNITS tablet Take 2,000 Units by mouth daily.        . IRON PO Take 1 tablet by mouth daily.        . prenatal vitamin w/FE, FA (PRENATAL 1 + 1) 27-1 MG TABS Take 1 tablet by mouth daily.          Physical Exam   Blood pressure 124/71, pulse 90, temperature 98.4 F (36.9 C), temperature source Oral, resp. rate 18, height 5\' 10"  (1.778 m), weight 148.054 kg (326 lb 6.4 oz).  UCs mild, irregular.  FHR  130-150/min, acc pos, no deceleration.  Reactive NST.  VE 2/50%/-2-3  Unchanged.  ED Course  36+ wks not in labor.  F-wellbeing reassuring. Recommendations made.  F/U [facility].  Genia Del MD

## 2010-07-10 NOTE — H&P (Signed)
  Admission note for Charlene [name]. The patient is admitted for spontaneous rupture membranes at 5 PM this afternoon. Group B strep unavailable.  History and physical dictated.  Impression: Spontaneous rupture of membranes at 36-2/7 weeks with no active labor noted. GBS pending. Morbid obesity.  Plan: We'll check GBS status and if positive proceed with augmentation of labor. Patient unaware of allergy to cephalosporins Will use Ancef in labor.

## 2010-07-10 NOTE — Progress Notes (Signed)
Charlene [name] is a 40 y.o. G3P1011 at [redacted]w[redacted]d by ultrasound admitted for rupture of membranes. No labor. Clear fluid.  Subjective:   Objective: BP 128/66  Pulse 83  Temp(Src) 98.9 F (37.2 C) (Oral)      FHT:  FHR: 140's bpm, variability: moderate,  accelerations:  Present,  decelerations:  Absent UC:   irregular, every 8 minutes SVE:   Dilation: 2 Effacement (%): 50 Station: -3 Exam by:: SEF  Labs: Lab Results  Component Value Date   WBC 9.0 07/10/2010   HGB 11.7* 07/10/2010   HCT 34.6* 07/10/2010   MCV 79.0 07/10/2010   PLT 173 07/10/2010    Assessment / Plan: SROM at 36+ weeks with GBS Unknown Morbid Obesity  Labor: Progressing normally Preeclampsia:  na Fetal Wellbeing:  Category I Pain Control:  Labor support without medications I/D:  n/a Anticipated MOD:  NSVD  Cristopher Ciccarelli J 07/10/2010, 8:37 PM

## 2010-07-11 ENCOUNTER — Encounter (HOSPITAL_COMMUNITY): Payer: Self-pay | Admitting: *Deleted

## 2010-07-11 ENCOUNTER — Encounter (HOSPITAL_COMMUNITY): Payer: Self-pay | Admitting: Anesthesiology

## 2010-07-11 ENCOUNTER — Inpatient Hospital Stay (HOSPITAL_COMMUNITY): Payer: BC Managed Care – PPO | Admitting: Anesthesiology

## 2010-07-11 LAB — CBC
HCT: 30.8 % — ABNORMAL LOW (ref 36.0–46.0)
Hemoglobin: 10.5 g/dL — ABNORMAL LOW (ref 12.0–15.0)
MCH: 26.8 pg (ref 26.0–34.0)
MCHC: 34.1 g/dL (ref 30.0–36.0)
MCV: 78.6 fL (ref 78.0–100.0)
Platelets: 167 10*3/uL (ref 150–400)
RBC: 3.92 MIL/uL (ref 3.87–5.11)
RDW: 15.3 % (ref 11.5–15.5)
WBC: 26.4 10*3/uL — ABNORMAL HIGH (ref 4.0–10.5)

## 2010-07-11 LAB — ABO/RH: ABO/RH(D): O POS

## 2010-07-11 LAB — RPR: RPR Ser Ql: NONREACTIVE

## 2010-07-11 MED ORDER — FERROUS SULFATE 325 (65 FE) MG PO TABS
325.0000 mg | ORAL_TABLET | ORAL | Status: DC
  Administered 2010-07-11 – 2010-07-12 (×2): 325 mg via ORAL
  Filled 2010-07-11 (×3): qty 1

## 2010-07-11 MED ORDER — EPHEDRINE 5 MG/ML INJ
10.0000 mg | INTRAVENOUS | Status: DC | PRN
  Filled 2010-07-11: qty 4

## 2010-07-11 MED ORDER — DIPHENHYDRAMINE HCL 50 MG/ML IJ SOLN: 12.5000 mg | INTRAMUSCULAR | Status: DC | PRN

## 2010-07-11 MED ORDER — FENTANYL 2.5 MCG/ML BUPIVACAINE 1/10 % EPIDURAL INFUSION (WH - ANES)
2.0000 mL/h | INTRAMUSCULAR | Status: DC
  Filled 2010-07-11: qty 60

## 2010-07-11 MED ORDER — EPHEDRINE 5 MG/ML INJ: 10.0000 mg | INTRAVENOUS | Status: DC | PRN

## 2010-07-11 MED ORDER — ONDANSETRON HCL 4 MG/2ML IJ SOLN: 4.0000 mg | INTRAMUSCULAR | Status: DC | PRN

## 2010-07-11 MED ORDER — ONDANSETRON HCL 4 MG PO TABS: 4.0000 mg | ORAL_TABLET | ORAL | Status: DC | PRN

## 2010-07-11 MED ORDER — OXYCODONE-ACETAMINOPHEN 5-325 MG PO TABS
1.0000 | ORAL_TABLET | ORAL | Status: DC | PRN
  Administered 2010-07-11 – 2010-07-13 (×6): 1 via ORAL
  Filled 2010-07-11 (×7): qty 1

## 2010-07-11 MED ORDER — WITCH HAZEL-GLYCERIN EX PADS: MEDICATED_PAD | CUTANEOUS | Status: DC | PRN

## 2010-07-11 MED ORDER — FENTANYL 2.5 MCG/ML BUPIVACAINE 1/10 % EPIDURAL INFUSION (WH - ANES)
2.0000 mL/h | INTRAMUSCULAR | Status: DC
  Administered 2010-07-11: 14 mL/h via EPIDURAL
  Filled 2010-07-11: qty 60

## 2010-07-11 MED ORDER — LACTATED RINGERS IV SOLN: 500.0000 mL | Freq: Once | INTRAVENOUS | Status: DC

## 2010-07-11 MED ORDER — ZOLPIDEM TARTRATE 5 MG PO TABS: 5.0000 mg | ORAL_TABLET | Freq: Every evening | ORAL | Status: DC | PRN

## 2010-07-11 MED ORDER — LIDOCAINE HCL 1.5 % IJ SOLN
INTRAMUSCULAR | Status: DC | PRN
  Administered 2010-07-11: 2 mL
  Administered 2010-07-11 (×2): 5 mL

## 2010-07-11 MED ORDER — OXYTOCIN 20 UNITS IN LACTATED RINGERS INFUSION - SIMPLE: 125.0000 mL/h | INTRAVENOUS | Status: DC | PRN

## 2010-07-11 MED ORDER — SIMETHICONE 80 MG PO CHEW: 80.0000 mg | CHEWABLE_TABLET | ORAL | Status: DC | PRN

## 2010-07-11 MED ORDER — METHYLERGONOVINE MALEATE 0.2 MG/ML IJ SOLN: 0.2000 mg | Freq: Four times a day (QID) | INTRAMUSCULAR | Status: DC | PRN

## 2010-07-11 MED ORDER — TETANUS-DIPHTH-ACELL PERTUSSIS 5-2.5-18.5 LF-MCG/0.5 IM SUSP
0.5000 mL | Freq: Once | INTRAMUSCULAR | Status: AC
  Administered 2010-07-12: 0.5 mL via INTRAMUSCULAR
  Filled 2010-07-11: qty 0.5

## 2010-07-11 MED ORDER — PHENYLEPHRINE 40 MCG/ML (10ML) SYRINGE FOR IV PUSH (FOR BLOOD PRESSURE SUPPORT): 80.0000 ug | PREFILLED_SYRINGE | INTRAVENOUS | Status: DC | PRN

## 2010-07-11 MED ORDER — SENNOSIDES-DOCUSATE SODIUM 8.6-50 MG PO TABS
1.0000 | ORAL_TABLET | Freq: Every day | ORAL | Status: DC
  Administered 2010-07-11 – 2010-07-12 (×2): 1 via ORAL
  Filled 2010-07-11 (×3): qty 2

## 2010-07-11 MED ORDER — BENZOCAINE-MENTHOL 20-0.5 % EX AERO
1.0000 "application " | INHALATION_SPRAY | CUTANEOUS | Status: DC | PRN
  Administered 2010-07-11: 1 via TOPICAL

## 2010-07-11 MED ORDER — LANOLIN HYDROUS EX OINT: TOPICAL_OINTMENT | CUTANEOUS | Status: DC | PRN

## 2010-07-11 MED ORDER — PRENATAL PLUS 27-1 MG PO TABS
1.0000 | ORAL_TABLET | Freq: Every day | ORAL | Status: DC
  Administered 2010-07-11 – 2010-07-13 (×3): 1 via ORAL
  Filled 2010-07-11 (×3): qty 1

## 2010-07-11 MED ORDER — DIPHENHYDRAMINE HCL 25 MG PO CAPS: 25.0000 mg | ORAL_CAPSULE | Freq: Four times a day (QID) | ORAL | Status: DC | PRN

## 2010-07-11 MED ORDER — OXYCODONE-ACETAMINOPHEN 5-325 MG PO TABS
ORAL_TABLET | ORAL | Status: AC
  Administered 2010-07-11: 2
  Filled 2010-07-11: qty 2

## 2010-07-11 MED ORDER — IBUPROFEN 600 MG PO TABS
600.0000 mg | ORAL_TABLET | Freq: Four times a day (QID) | ORAL | Status: DC
  Administered 2010-07-11 – 2010-07-13 (×7): 600 mg via ORAL
  Filled 2010-07-11 (×7): qty 1

## 2010-07-11 MED ORDER — ERYTHROMYCIN 5 MG/GM OP OINT
TOPICAL_OINTMENT | OPHTHALMIC | Status: AC
  Filled 2010-07-11: qty 1

## 2010-07-11 MED ORDER — VITAMIN D 1000 UNITS PO TABS
2000.0000 [IU] | ORAL_TABLET | Freq: Every day | ORAL | Status: DC
  Administered 2010-07-11 – 2010-07-13 (×3): 2000 [IU] via ORAL
  Filled 2010-07-11 (×4): qty 2

## 2010-07-11 MED ORDER — PHENYLEPHRINE 40 MCG/ML (10ML) SYRINGE FOR IV PUSH (FOR BLOOD PRESSURE SUPPORT)
80.0000 ug | PREFILLED_SYRINGE | INTRAVENOUS | Status: DC | PRN
  Filled 2010-07-11: qty 5

## 2010-07-11 MED ORDER — BENZOCAINE-MENTHOL 20-0.5 % EX AERO
INHALATION_SPRAY | CUTANEOUS | Status: AC
  Administered 2010-07-11: 1 via TOPICAL
  Filled 2010-07-11: qty 56

## 2010-07-11 NOTE — H&P (Signed)
NAMECORINN, STOLTZFUS NO.:  [id number]  MEDICAL RECORD NO.:  [id number]  LOCATION:  9175                          FACILITY:  [facility]  PHYSICIAN:  Lenoard Aden, M.D.DATE OF BIRTH:  1970/02/16  DATE OF ADMISSION:  07/10/2010 DATE OF DISCHARGE:                             HISTORY & PHYSICAL   CHIEF COMPLAINT:  Questionable leakage of fluid.  HISTORY OF PRESENT ILLNESS:  The patient is a 40 year old African American female G3, P1, history of vaginal delivery x1 and one and TAB x1, and now with questionable leakage of fluid at 36-2/7 weeks' gestation.  Medications include prenatal vitamins, iron supplements, vitamin D.  She is a nonsmoker and nondrinker.  She denies domestic physical violence.  FAMILY HISTORY:  There is a family history of drug abuse, sickle cell and diabetes.  PAST SURGICAL HISTORY:  She has a personal surgical history remarkable for a hernia repair in 2004 and gastric bypass surgery 2000.  PREGNANCY HISTORY:  Previous pregnancy history is remarkable for vaginal delivery in 1999 and TAB in 2000.  The prenatal course date has been complicated.  The last ultrasound was performed on July 01, 2010, with an estimated fetal 6 pounds 2 ounces, and normal AFI vertex presenting.  PHYSICAL EXAMINATION:  GENERAL:  She is an obese-appearing African American female, in no acute distress. HEENT:  Normal. NECK:  Supple.  Full range of motion. LUNGS:  Clear. HEART:  Regular rhythm. ABDOMEN:  Soft and nontender.  No CVA tenderness. EXTREMITIES:  There are no cords. NEUROLOGIC:  Nonfocal. SKIN:  Intact.  Examination by RN reveals cervix __________ with clear fluid noted.  GBS was performed on July 01, 2010, and is currently unavailable.  Fetal heart tracing is reassuring to reactive with rare tracks noted.  CBC is pending.  IMPRESSION: 1. A 36-2/7 week intrauterine pregnancy. 2. Morbid obesity. 3. History of gastric bypass. 4. History of iron  deficiency anemia.  PLAN:  Plan to admit.  We check her GBS status and anticipate __________ continues with standard fetal monitoring, epidural as needed.     Lenoard Aden, M.D.     RJT/MEDQ  D:  07/10/2010  T:  07/11/2010  Job:  [id number]

## 2010-07-11 NOTE — Progress Notes (Signed)
150

## 2010-07-11 NOTE — Anesthesia Procedure Notes (Signed)
Epidural Patient location during procedure: OB Start time: 07/11/2010 2:02 AM Reason for block: procedure for pain  Staffing Anesthesiologist: Alanee Ting L. Performed by: anesthesiologist   Preanesthetic Checklist Completed: patient identified, site marked, surgical consent, pre-op evaluation, timeout performed, IV checked, risks and benefits discussed and monitors and equipment checked  Epidural Patient position: sitting Prep: site prepped and draped and DuraPrep Patient monitoring: blood pressure, continuous pulse ox and heart rate Approach: midline Injection technique: LOR air  Needle Needle type: Tuohy  Needle gauge: 17 G Needle length: 9 cm Catheter type: closed end flexible Catheter size: 19 Gauge Test dose: 1.5% lidocaine  Assessment Events: blood not aspirated, injection not painful, no injection resistance, negative IV test and no paresthesia  Additional Notes Discussed risk of headache, infection, bleeding, nerve injury and failed or incomplete block.  Patient voices understanding and wishes to proceed.

## 2010-07-11 NOTE — Anesthesia Preprocedure Evaluation (Signed)
Anesthesia Evaluation  Name, MR# and DOB Patient awake  General Assessment Comment  Reviewed: Allergy & Precautions, H&P  and Patient's Chart, lab work & pertinent test results  History of Anesthesia Complications Negative for: history of anesthetic complications  Airway Mallampati: III      Dental   Pulmonaryneg pulmonary ROS    clear to auscultation  pulmonary exam normal   Cardiovascular regular Normal   Neuro/PsychNegative Neurological ROS Negative Psych ROS  GI/Hepatic/Renal negative GI ROS, negative Liver ROS, and negative Renal ROS (+)       Endo/Other   (+)  Morbid obesity Abdominal   Musculoskeletal  Hematology negative hematology ROS (+)   Peds  Reproductive/Obstetrics (+) Pregnancy          Anesthesia Physical Anesthesia Plan  ASA: III  Anesthesia Plan: Epidural   Post-op Pain Management:    Induction:   Airway Management Planned:   Additional Equipment:   Intra-op Plan:   Post-operative Plan:   Informed Consent: I have reviewed the patients History and Physical, chart, labs and discussed the procedure including the risks, benefits and alternatives for the proposed anesthesia with the patient or authorized representative who has indicated his/her understanding and acceptance.     Plan Discussed with:   Anesthesia Plan Comments:         Anesthesia Quick Evaluation

## 2010-07-11 NOTE — Progress Notes (Signed)
BASIC BREASTFEEDING EDUCATION MATERIALS GIVEN TO PATIENT.  MOM STATES THIS IS HER FIRST TIME BREASTFEEDING.  BABY IS 36 WEEKS AND 3 DAYS.  MBU RN IS READY TO GIVE BATH AND THEN BABY WILL BE SKIN TO SKIN AND ATTEMPT FEEDING.  LC TO F/U FOR FEEDING ASSESSMENT.

## 2010-07-12 NOTE — Progress Notes (Signed)
Assisted with latch- Mom has rather large nipples and baby needs to open really wide to latch correctly. Mom stated latch feels fine- just tugging no pain. No questions at present.

## 2010-07-12 NOTE — Progress Notes (Signed)
  Post Partum Day 1 S/P spontaneous vaginal RH status/Rubella reviewed.  Feeding: breast Subjective: No HA, SOB, CP, F/C, breast symptoms. Normal vaginal bleeding, no clots. Voiding without difficulty. +flatus/no stool  Objective: BP 114/77  Pulse 82  Temp(Src) 97.9 F (36.6 C) (Oral)  Resp 18  Breastfeeding? Unknown I&O reviewed.   Physical Exam:  General: alert Lochia: appropriate Uterine Fundus: firm/U/E DVT Evaluation: No evidence of DVT seen on physical exam. Ext: No c/c/e  Basename 07/11/10 1910 07/10/10 1810  HGB 10.5* 11.7*  HCT 30.8* 34.6*      Assessment/Plan: 40 y.o.  PPD #1 .  normal postpartum exam Continue current postpartum care Ambulate Discharge in am  LOS: 2 days   [facility] 07/12/2010, 9:55 AM

## 2010-07-13 ENCOUNTER — Encounter (HOSPITAL_COMMUNITY): Payer: Self-pay | Admitting: Obstetrics and Gynecology

## 2010-07-13 LAB — CBC
HCT: 30 % — ABNORMAL LOW (ref 36.0–46.0)
Hemoglobin: 10.2 g/dL — ABNORMAL LOW (ref 12.0–15.0)
MCH: 26.7 pg (ref 26.0–34.0)
MCHC: 34 g/dL (ref 30.0–36.0)
MCV: 78.5 fL (ref 78.0–100.0)
Platelets: 179 10*3/uL (ref 150–400)
RBC: 3.82 MIL/uL — ABNORMAL LOW (ref 3.87–5.11)
RDW: 15.5 % (ref 11.5–15.5)
WBC: 9.7 10*3/uL (ref 4.0–10.5)

## 2010-07-13 MED ORDER — OXYCODONE-ACETAMINOPHEN 5-325 MG PO TABS: 1.0000 | ORAL_TABLET | ORAL | Status: AC | PRN

## 2010-07-13 NOTE — Progress Notes (Signed)
Post Partum Day #2 S/P:spontaneous vaginal  RH status/Rubella reviewed.  Feeding: breast Subjective: No HA, SOB, CP, F/C, breast symptoms. Normal vaginal bleeding, no clots.     Objective: BP 96/64  Pulse 81  Temp(Src) 98.4 F (36.9 C) (Oral)  Resp 20  Breastfeeding? Unknown I&O reviewed.    Physical Exam:  General: alert Lochia: appropriate Uterine Fundus: firm DVT Evaluation: No evidence of DVT seen on physical exam. Ext: No c/c/e  Basename 07/13/10 0836 07/11/10 1910  HGB 10.2* 10.5*  HCT 30.0* 30.8*    Assessment/Plan: 40 y.o.  PPD # 2 .  normal postpartum exam Continue current postpartum care D/C home   LOS: 3 days   [facility] 07/13/2010, 10:12 AM

## 2010-07-13 NOTE — Discharge Summary (Signed)
Physician Discharge Summary  Patient ID: Charlene [name] MRN: [id number] DOB/AGE: 09-04-1970 39 y.o.  Admit date: 07/10/2010 Discharge date: 07/13/2010  Admission Diagnoses:  Discharge Diagnoses:  Active Problems:  Postpartum care following vaginal delivery  Lactating mother   Discharged Condition: good  Hospital Course: stable  Significant Diagnostic Studies:  HGB  Date/Time Value Range Status  06/08/2010  4:17 PM 11.3* 11.6-15.9 (g/dL) Final  09/16/7827  5:62 PM 11.2* 11.6-15.9 (g/dL) Final     Hemoglobin  Date/Time Value Range Status  07/13/2010  8:36 AM 10.2* 12.0-15.0 (g/dL) Final  01/05/863  7:84 PM 10.5* 12.0-15.0 (g/dL) Final     HCT  Date/Time Value Range Status  07/13/2010  8:36 AM 30.0* 36.0-46.0 (%) Final  07/11/2010  7:10 PM 30.8* 36.0-46.0 (%) Final  06/08/2010  4:17 PM 33.0* 34.8-46.6 (%) Final  03/18/2010  4:04 PM 32.4* 34.8-46.6 (%) Final      Discharge Exam: Blood pressure 96/64, pulse 81, temperature 98.4 F (36.9 C), temperature source Oral, resp. rate 20, unknown if currently breastfeeding.Disposition: Home or Self Care  Discharge Orders    Future Orders Please Complete By Expires   Strep B DNA probe      Comments:   This external order was created through the Results Console.   HIV antibody      Comments:   This external order was created through the Results Console.   Sickle cell screen      Comments:   This external order was created through the Results Console.   Rubella antibody, IgM      Comments:   This external order was created through the Results Console.   Hepatitis B surface antigen      Comments:   This external order was created through the Results Console.   RPR      Comments:   This external order was created through the Results Console.   Type and screen      Comments:   This external order was created through the Results Console.   ABO/Rh      Comments:   This external order was created through the Results Console.     Current Discharge Medication List    CONTINUE these medications which have NOT CHANGED   Details  Cholecalciferol (VITAMIN D) 2000 UNITS tablet Take 2,000 Units by mouth daily.      IRON PO Take 1 tablet by mouth daily.      prenatal vitamin w/FE, FA (PRENATAL 1 + 1) 27-1 MG TABS Take 1 tablet by mouth daily.           SignedLynden Ang 07/13/2010, 10:20 AM

## 2010-07-13 NOTE — Progress Notes (Signed)
BREASTFEEDING DISCHARGE TEACHING DONE WITH PATIENT.  BABY NURSING WELL AND HAS WEIGHT CHECK WITH PEDIATRICIAN TOMORROW.  ENCOURAGED TO CALL WITH QUESTIONS AND CONCERNS.

## 2010-07-13 NOTE — Discharge Instructions (Signed)
[facility] booklet given

## 2010-07-22 ENCOUNTER — Inpatient Hospital Stay (HOSPITAL_COMMUNITY): Payer: BC Managed Care – PPO

## 2010-07-22 ENCOUNTER — Inpatient Hospital Stay (HOSPITAL_COMMUNITY)
Admission: AD | Admit: 2010-07-22 | Discharge: 2010-07-22 | Disposition: A | Discharge status: Home / Self Care | Payer: BC Managed Care – PPO | Source: Ambulatory Visit | Attending: Obstetrics & Gynecology | Admitting: Obstetrics & Gynecology

## 2010-07-22 DIAGNOSIS — R58 Hemorrhage, not elsewhere classified: Secondary | ICD-10-CM

## 2010-07-22 LAB — CBC
HCT: 32.1 % — ABNORMAL LOW (ref 36.0–46.0)
Hemoglobin: 10.7 g/dL — ABNORMAL LOW (ref 12.0–15.0)
MCH: 26.4 pg (ref 26.0–34.0)
MCHC: 33.3 g/dL (ref 30.0–36.0)
MCV: 79.3 fL (ref 78.0–100.0)
Platelets: 282 10*3/uL (ref 150–400)
RBC: 4.05 MIL/uL (ref 3.87–5.11)
RDW: 15.2 % (ref 11.5–15.5)
WBC: 6.7 10*3/uL (ref 4.0–10.5)

## 2010-07-22 LAB — PREPARE RBC (CROSSMATCH)

## 2010-07-22 MED ORDER — LACTATED RINGERS IV SOLN
INTRAVENOUS | Status: DC
  Administered 2010-07-22: 06:00:00 via INTRAVENOUS

## 2010-07-22 MED ORDER — METHYLERGONOVINE MALEATE 0.2 MG/ML IJ SOLN
0.2000 mg | Freq: Once | INTRAMUSCULAR | Status: AC
  Administered 2010-07-22: 0.2 mg via INTRAMUSCULAR
  Filled 2010-07-22: qty 1

## 2010-07-22 MED ORDER — METHYLERGONOVINE MALEATE 0.2 MG PO TABS
0.2000 mg | ORAL_TABLET | Freq: Once | ORAL | Status: AC
Start: 2010-07-22 — End: 2010-07-22
  Administered 2010-07-22: 0.2 mg via ORAL
  Filled 2010-07-22: qty 1

## 2010-07-22 MED ORDER — METHYLERGONOVINE MALEATE 0.2 MG PO TABS: 0.2000 mg | ORAL_TABLET | Freq: Four times a day (QID) | ORAL | Status: AC

## 2010-07-22 NOTE — Progress Notes (Signed)
Started having vaginal bleeding last night @ 2230, using 1 pad per hr.  Vaginal delivery 07-11-10.  G3P2

## 2010-07-22 NOTE — Progress Notes (Signed)
Pt presents to [facility] for c/o post partum bleeding.  Started on Wednesday.  Pt had a vaginal delivery on the 9th of July.  States bleeding started getting heavy around 10pm last night.  Pt has been changing pads every hour.

## 2010-07-22 NOTE — Progress Notes (Signed)
Dr. Seymour Bars notified of pt arrival. Notified of pt c/o bleeding over night and saturating pad. Notified of previous cbc after delivery.  Notified of initial elevated bp's.  Orders received.

## 2010-07-22 NOTE — Progress Notes (Signed)
U/S and lab results called to Dr. Seymour Bars.  Order rec'd.

## 2010-07-22 NOTE — Progress Notes (Signed)
N. Bascom Levels, CNM at bedside.  MSE done.

## 2010-07-22 NOTE — ED Provider Notes (Signed)
History  Charlene [name] 40 y.o. [id number]  SVD at 36+ wks 07/11/2010, spont. Evacuation of placenta, no Cx.     Chief Complaint  Patient presents with  . Vaginal Bleeding       Post-partum x 07/11/2010  HPI:  Vaginal bleeding wnl until last night when had 1 episode of heavier bleeding with blood clots +++.  No fever, no abdo-pelvic pain.  ROS neg.  OB History    Grav Para Term Preterm Abortions TAB SAB Ect Mult Living   3 2 1 1 1     2       Past Medical History  Diagnosis Date  . No pertinent past medical history   . Lactating mother 07/13/2010    Past Surgical History  Procedure Date  . Gastric bypass 2000  . Hernia repair 2003  . Ventral hernia repair 2004    Family History  Problem Relation Age of Onset  . Hypertension Mother   . Heart disease Father   . Hypertension Father     History  Substance Use Topics  . Smoking status: Never Smoker   . Smokeless tobacco: Not on file  . Alcohol Use: Yes     social drinker    Allergies:  Allergies  Allergen Reactions  . Penicillins Hives and Shortness Of Breath    Heart rate increases    Prescriptions prior to admission  Medication Sig Dispense Refill  . Cholecalciferol (VITAMIN D) 2000 UNITS tablet Take 2,000 Units by mouth daily.        . IRON PO Take 1 tablet by mouth daily.        . prenatal vitamin w/FE, FA (PRENATAL 1 + 1) 27-1 MG TABS Take 1 tablet by mouth daily.        Marland Kitchen oxyCODONE-acetaminophen (ROXICET) 5-325 MG per tablet Take 1 tablet by mouth every 4 (four) hours as needed for pain.  30 tablet  0    Physical Exam   Blood pressure 146/79, pulse 57, temperature 98.8 F (37.1 C), temperature source Oral, resp. rate 16, height 5' 8.5" (1.74 m), weight 143.518 kg (316 lb 6.4 oz), currently breastfeeding.  Abdo soft, non-tender VE Uterus well contracted, non-tender.  No adnexal mass.  No blood clot, mild vaginal dark blood, no POC felt. Lower limbs wnl.  ED Course  Mild vaginal bleeding.  Started on  Methergine.  Hgb stable, no evidence of endometritis.  [place] blood clots, POC not r/o. Pelvic US endometrium 4.5 cm with probable blood clots, POC not r/o.  Otherwise neg.  A/P:  PP heavy bleeding, possible retained POC vs blood clots on [place].  Stable Hgb and no evidence of endometritis.  Will give Methergine regularly x 24 hrs and f/u within a wk with repeat US at [facility] with Dr Ernestina Penna.  Precautions given for hemorrhage and infection.  Genia Del MD  07/22/2010 at 10:20

## 2010-07-22 NOTE — Progress Notes (Signed)
[place] paged twice. No call back.  Will try desk and mobile.

## 2010-07-26 LAB — TYPE AND SCREEN
ABO/RH(D): O POS
Antibody Screen: NEGATIVE
Unit division: 0
Unit division: 0

## 2010-09-20 ENCOUNTER — Encounter (HOSPITAL_BASED_OUTPATIENT_CLINIC_OR_DEPARTMENT_OTHER): Payer: BC Managed Care – PPO | Admitting: Oncology

## 2010-09-20 ENCOUNTER — Other Ambulatory Visit: Payer: Self-pay | Admitting: Oncology

## 2010-09-20 DIAGNOSIS — D509 Iron deficiency anemia, unspecified: Secondary | ICD-10-CM

## 2010-09-20 DIAGNOSIS — D500 Iron deficiency anemia secondary to blood loss (chronic): Secondary | ICD-10-CM

## 2010-09-20 DIAGNOSIS — N920 Excessive and frequent menstruation with regular cycle: Secondary | ICD-10-CM

## 2010-09-20 LAB — CBC & DIFF AND RETIC
BASO%: 0.6 % (ref 0.0–2.0)
Basophils Absolute: 0 10*3/uL (ref 0.0–0.1)
EOS%: 5.5 % (ref 0.0–7.0)
Eosinophils Absolute: 0.3 10*3/uL (ref 0.0–0.5)
HCT: 31.8 % — ABNORMAL LOW (ref 34.8–46.6)
HGB: 10.5 g/dL — ABNORMAL LOW (ref 11.6–15.9)
Immature Retic Fract: 20.4 % — ABNORMAL HIGH (ref 1.60–10.00)
LYMPH%: 43.8 % (ref 14.0–49.7)
MCH: 25.2 pg (ref 25.1–34.0)
MCHC: 33 g/dL (ref 31.5–36.0)
MCV: 76.3 fL — ABNORMAL LOW (ref 79.5–101.0)
MONO#: 0.5 10*3/uL (ref 0.1–0.9)
MONO%: 10.2 % (ref 0.0–14.0)
NEUT#: 2 10*3/uL (ref 1.5–6.5)
NEUT%: 39.9 % (ref 38.4–76.8)
Platelets: 204 10*3/uL (ref 145–400)
RBC: 4.17 10*6/uL (ref 3.70–5.45)
RDW: 15.2 % — ABNORMAL HIGH (ref 11.2–14.5)
Retic %: 2.19 % — ABNORMAL HIGH (ref 0.70–2.10)
Retic Ct Abs: 91.32 10*3/uL — ABNORMAL HIGH (ref 33.70–90.70)
WBC: 5.1 10*3/uL (ref 3.9–10.3)
lymph#: 2.2 10*3/uL (ref 0.9–3.3)

## 2010-09-20 LAB — FERRITIN: Ferritin: 6 ng/mL — ABNORMAL LOW (ref 10–291)

## 2010-10-07 ENCOUNTER — Encounter (HOSPITAL_BASED_OUTPATIENT_CLINIC_OR_DEPARTMENT_OTHER): Payer: BC Managed Care – PPO | Admitting: Oncology

## 2010-10-07 DIAGNOSIS — D509 Iron deficiency anemia, unspecified: Secondary | ICD-10-CM

## 2010-12-13 ENCOUNTER — Other Ambulatory Visit: Payer: BC Managed Care – PPO | Admitting: Lab

## 2010-12-20 ENCOUNTER — Ambulatory Visit: Payer: BC Managed Care – PPO | Admitting: Oncology

## 2011-08-01 ENCOUNTER — Telehealth: Payer: Self-pay | Admitting: *Deleted

## 2011-08-01 DIAGNOSIS — D649 Anemia, unspecified: Secondary | ICD-10-CM | POA: Insufficient documentation

## 2011-08-01 NOTE — Telephone Encounter (Signed)
Message left by pt stating she was seen by other provider and needs to have labs and possible iron replacement per this office.  Call returned to pt-obtained identified VM- message left stating appointment for lab is being requested and pt should be contacted.  onc tx schedule sent to scheduling. Lab order entered.

## 2011-08-04 ENCOUNTER — Telehealth: Payer: Self-pay | Admitting: Oncology

## 2011-08-04 NOTE — Telephone Encounter (Signed)
Returned pt's call and gv her appt for 8/7 @ 4 pm. appt scheduled per 7/30 pof.

## 2011-08-09 ENCOUNTER — Other Ambulatory Visit (HOSPITAL_BASED_OUTPATIENT_CLINIC_OR_DEPARTMENT_OTHER): Payer: BC Managed Care – PPO | Admitting: Lab

## 2011-08-09 DIAGNOSIS — D649 Anemia, unspecified: Secondary | ICD-10-CM

## 2011-08-09 LAB — CBC WITH DIFFERENTIAL/PLATELET
BASO%: 0.9 % (ref 0.0–2.0)
Basophils Absolute: 0.1 10*3/uL (ref 0.0–0.1)
EOS%: 4.4 % (ref 0.0–7.0)
Eosinophils Absolute: 0.3 10*3/uL (ref 0.0–0.5)
HCT: 31.6 % — ABNORMAL LOW (ref 34.8–46.6)
HGB: 10.1 g/dL — ABNORMAL LOW (ref 11.6–15.9)
LYMPH%: 34.5 % (ref 14.0–49.7)
MCH: 23.2 pg — ABNORMAL LOW (ref 25.1–34.0)
MCHC: 31.9 g/dL (ref 31.5–36.0)
MCV: 72.8 fL — ABNORMAL LOW (ref 79.5–101.0)
MONO#: 0.6 10*3/uL (ref 0.1–0.9)
MONO%: 10.7 % (ref 0.0–14.0)
NEUT#: 3 10*3/uL (ref 1.5–6.5)
NEUT%: 49.5 % (ref 38.4–76.8)
Platelets: 224 10*3/uL (ref 145–400)
RBC: 4.34 10*6/uL (ref 3.70–5.45)
RDW: 17.3 % — ABNORMAL HIGH (ref 11.2–14.5)
WBC: 6 10*3/uL (ref 3.9–10.3)
lymph#: 2.1 10*3/uL (ref 0.9–3.3)

## 2011-08-09 LAB — FERRITIN: Ferritin: 1 ng/mL — ABNORMAL LOW (ref 10–291)

## 2011-08-10 ENCOUNTER — Other Ambulatory Visit: Payer: Self-pay | Admitting: Physician Assistant

## 2011-08-15 ENCOUNTER — Ambulatory Visit: Payer: BC Managed Care – PPO | Admitting: Oncology

## 2011-08-15 ENCOUNTER — Ambulatory Visit: Payer: BC Managed Care – PPO

## 2011-08-16 ENCOUNTER — Ambulatory Visit (HOSPITAL_BASED_OUTPATIENT_CLINIC_OR_DEPARTMENT_OTHER): Payer: BC Managed Care – PPO | Admitting: Oncology

## 2011-08-16 ENCOUNTER — Telehealth: Payer: Self-pay | Admitting: *Deleted

## 2011-08-16 ENCOUNTER — Ambulatory Visit: Payer: BC Managed Care – PPO

## 2011-08-16 ENCOUNTER — Encounter: Payer: Self-pay | Admitting: *Deleted

## 2011-08-16 VITALS — BP 162/99 | HR 105 | Temp 98.5°F | Resp 20 | Ht 68.5 in | Wt 346.6 lb

## 2011-08-16 DIAGNOSIS — D649 Anemia, unspecified: Secondary | ICD-10-CM

## 2011-08-16 DIAGNOSIS — D509 Iron deficiency anemia, unspecified: Secondary | ICD-10-CM

## 2011-08-16 DIAGNOSIS — Z9884 Bariatric surgery status: Secondary | ICD-10-CM

## 2011-08-16 NOTE — Progress Notes (Signed)
ID: Charlene [name]   DOB: Jul 14, 1970  MR#: [id number]  WUJ#:[id number]  HISTORY OF PRESENT ILLNESS: The patient had a gastric bypass for weight control in 2002.  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  She became significantly anemic and was receiving intravenous iron under Dr. Huston [name] in [place], [place].  She has now moved to this area and would like to continue to receive the intravenous iron on an as needed basis.   INTERVAL HISTORY: The patient returns today for routine followup of her iron malabsorption. The interval history is otherwise significant for having had a daughter a year ago.  REVIEW OF SYSTEMS: She continues to have heavy periods. She is working with Dr. focal man in that regard. She feels moderately fatigued. She tells me she has a poor appetite infrequent problems with abdominal pain and cramps. She feels somewhat anxious and depressed. Otherwise a detailed review of systems today was noncontributory.  PAST MEDICAL HISTORY: Past Medical History  Diagnosis Date  . No pertinent past medical history   . Lactating mother 07/13/2010    PAST SURGICAL HISTORY: Past Surgical History  Procedure Date  . Gastric bypass 2000  . Hernia repair 2003  . Ventral hernia repair 2004  Aside from the gastric bypass, is only significant for herniorrhaphy and tonsillectomy and adenoidectomy.   FAMILY HISTORY Family History  Problem Relation Age of Onset  . Hypertension Mother   . Heart disease Father   . Hypertension Father   The patient's parents are living.  The patient has 3 brothers and 1 sister.  The sister had a stroke at age 33 in the setting of drug abuse.  Otherwise, the family is doing well.  There is no history of bleeding or clotting problems in the family.   GYNECOLOGIC HISTORY: She is G2, F1, P0, A1, L1.  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  Her husband, Charlene [name], works in Clinical biochemist.  They have a son, Charlene [name], 30 and a daughter Charlene [name],9 year old (as of August 2013).      ADVANCED DIRECTIVES: not in place  HEALTH MAINTENANCE: History  Substance Use Topics  . Smoking status: Never Smoker   . Smokeless tobacco: Not on file  . Alcohol Use: Yes     social drinker     Colonoscopy:  PAP:  Bone density:  Lipid panel:  Allergies  Allergen Reactions  . Penicillins Hives and Shortness Of Breath    Heart rate increases    Current Outpatient Prescriptions  Medication Sig Dispense Refill  . Cholecalciferol (VITAMIN D) 2000 UNITS tablet Take 2,000 Units by mouth daily.        . prenatal vitamin w/FE, FA (PRENATAL 1 + 1) 27-1 MG TABS Take 1 tablet by mouth daily.        . IRON PO Take 1 tablet by mouth daily.          OBJECTIVE: Young African American woman who appears well Filed Vitals:   08/16/11 1027  BP: 162/99  Pulse: 105  Temp: 98.5 F (36.9 C)  Resp: 20     Body mass index is 51.93 kg/(m^2).    ECOG FS: 0  Sclerae unicteric Oropharynx clear No cervical or supraclavicular adenopathy Lungs no rales or rhonchi Heart regular rate and rhythm Abd obese, benign MSK no focal spinal  tenderness, no peripheral edema Neuro: nonfocal Breasts: Deferred  LAB RESULTS: Lab Results  Component Value Date   WBC 6.0 08/09/2011   NEUTROABS 3.0 08/09/2011   HGB 10.1* 08/09/2011   HCT 31.6* 08/09/2011   MCV 72.8* 08/09/2011   PLT 224 08/09/2011      Chemistry      Component Value Date/Time   NA 137 11/08/2005 1559   K 4.4 11/08/2005 1559   CL 106 11/08/2005 1559   CO2 24 11/08/2005 1559   BUN 10 11/08/2005 1559   CREATININE 0.75 11/08/2005 1559      Component Value Date/Time   CALCIUM 8.3* 11/08/2005 1559   ALKPHOS 120* 11/08/2005 1559   AST 18 11/08/2005 1559   ALT 17 11/08/2005 1559   BILITOT 0.3 11/08/2005 1559      Ferritin = 1  No results found for this basename: LABCA2    No components found with this basename: LABCA125    No results found for this basename: INR:1;PROTIME:1 in the last 168 hours  Urinalysis    Component Value Date/Time   COLORURINE YELLOW 07/10/2010 1022   APPEARANCEUR CLEAR 07/10/2010 1022   LABSPEC 1.010 07/10/2010 1022   PHURINE 6.0 07/10/2010 1022   GLUCOSEU NEGATIVE 07/10/2010 1022   HGBUR SMALL* 07/10/2010 1022   BILIRUBINUR NEGATIVE 07/10/2010 1022   KETONESUR NEGATIVE 07/10/2010 1022   PROTEINUR NEGATIVE 07/10/2010 1022   UROBILINOGEN 0.2 07/10/2010 1022   NITRITE NEGATIVE 07/10/2010 1022   LEUKOCYTESUR TRACE* 07/10/2010 1022    STUDIES: No results found.  ASSESSMENT: 41 y.o. 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   PLAN: She is severely iron deficient and I think it will take several treatments to correct it. Not only does she not absorb iron well but she has significant iron lost her menstruation. She will receive fairly HEENT today and monthly for a total of 3 doses, then every 3 months x3. She then will see me again, a year from now, and at that point we will likely set her up for for him treatments every 6 months assuming her ferritin of stays stable.  She will let me know if she has any problems from the treatments or any other problems before her next visit here.   [name],Charlene C    08/16/2011

## 2011-08-16 NOTE — Progress Notes (Signed)
Mailed after appt letter to pt. 

## 2011-08-16 NOTE — Telephone Encounter (Signed)
Gave the patient appointment for fereme on 08-17-2011

## 2011-08-17 ENCOUNTER — Ambulatory Visit (HOSPITAL_BASED_OUTPATIENT_CLINIC_OR_DEPARTMENT_OTHER): Payer: BC Managed Care – PPO

## 2011-08-17 ENCOUNTER — Other Ambulatory Visit: Payer: Self-pay | Admitting: *Deleted

## 2011-08-17 ENCOUNTER — Other Ambulatory Visit: Payer: BC Managed Care – PPO | Admitting: Lab

## 2011-08-17 VITALS — BP 133/81 | HR 73 | Temp 97.9°F | Resp 17

## 2011-08-17 DIAGNOSIS — D649 Anemia, unspecified: Secondary | ICD-10-CM

## 2011-08-17 DIAGNOSIS — D509 Iron deficiency anemia, unspecified: Secondary | ICD-10-CM

## 2011-08-17 MED ORDER — FERUMOXYTOL INJECTION 510 MG/17 ML
510.0000 mg | Freq: Once | INTRAVENOUS | Status: AC
  Administered 2011-08-17: 510 mg via INTRAVENOUS
  Filled 2011-08-17: qty 17

## 2011-08-17 MED ORDER — SODIUM CHLORIDE 0.9 % IJ SOLN
10.0000 mL | INTRAMUSCULAR | Status: DC | PRN
  Filled 2011-08-17: qty 10

## 2011-08-17 MED ORDER — SODIUM CHLORIDE 0.9 % IV SOLN
Freq: Once | INTRAVENOUS | Status: AC
  Administered 2011-08-17: 09:00:00 via INTRAVENOUS

## 2011-08-17 NOTE — Patient Instructions (Signed)
  FERUMOXYTOL is an iron complex. Iron is used to make healthy red blood cells, which carry oxygen and nutrients throughout the body. This medicine is used to treat iron deficiency anemia in people with chronic kidney disease. This medicine may be used for other purposes; ask your health care provider or pharmacist if you have questions. What should I tell my health care provider before I take this medicine? They need to know if you have any of these conditions: -anemia not caused by low iron levels -high levels of iron in the blood -magnetic resonance imaging (MRI) test scheduled -an unusual or allergic reaction to iron, other medicines, foods, dyes, or preservatives -pregnant or trying to get pregnant -breast-feeding How should I use this medicine? This medicine is for infusion into a vein. It is given by a health care professional in a hospital or clinic setting. Talk to your pediatrician regarding the use of this medicine in children. Special care may be needed. Overdosage: If you think you've taken too much of this medicine contact a poison control center or emergency room at once. Overdosage: If you think you have taken too much of this medicine contact a poison control center or emergency room at once. NOTE: This medicine is only for you. Do not share this medicine with others. What if I miss a dose? It is important not to miss your dose. Call your doctor or health care professional if you are unable to keep an appointment. What may interact with this medicine? This medicine may interact with the following medications: -other iron products This list may not describe all possible interactions. Give your health care provider a list of all the medicines, herbs, non-prescription drugs, or dietary supplements you use. Also tell them if you smoke, drink alcohol, or use illegal drugs. Some items may interact with your medicine. What should I watch for while using this medicine? Visit your  doctor or healthcare professional regularly. Tell your doctor or healthcare professional if your symptoms do not start to get better or if they get worse. You may need blood work done while you are taking this medicine. You may need to follow a special diet. Talk to your doctor. Foods that contain iron include: whole grains/cereals, dried fruits, beans, or peas, leafy green vegetables, and organ meats (liver, kidney). What side effects may I notice from receiving this medicine? Side effects that you should report to your doctor or health care professional as soon as possible: -allergic reactions like skin rash, itching or hives, swelling of the face, lips, or tongue -breathing problems -changes in blood pressure -feeling faint or lightheaded, falls -fever or chills -flushing, sweating, or hot feelings -swelling of the ankles or feet Side effects that usually do not require medical attention (Report these to your doctor or health care professional if they continue or are bothersome.): -diarrhea -headache -nausea, vomiting -stomach pain This list may not describe all possible side effects. Call your doctor for medical advice about side effects. You may report side effects to FDA at 1-800-FDA-1088. Where should I keep my medicine? This drug is given in a hospital or clinic and will not be stored at home. NOTE: This sheet is a summary. It may not cover all possible information. If you have questions about this medicine, talk to your doctor, pharmacist, or health care provider.  2012, Elsevier/Gold Standard. (09/11/2007 9:48:25 PM) 

## 2011-08-17 NOTE — Progress Notes (Signed)
4098  Patient tolerated treatment well with no complaints. Monitored for 20 minutes post Feraheme. Discharged ambulating, VSS and  with no complaints.  AVS given.

## 2011-09-07 ENCOUNTER — Telehealth: Payer: Self-pay | Admitting: *Deleted

## 2011-09-07 NOTE — Telephone Encounter (Signed)
Patient called regarding her appts for next week. No iron appts made, I have put in treatment appts. Patient question her treatment cycles. Appts given to desk RN.  JMW

## 2011-09-12 ENCOUNTER — Other Ambulatory Visit: Payer: Self-pay | Admitting: *Deleted

## 2011-09-12 DIAGNOSIS — D649 Anemia, unspecified: Secondary | ICD-10-CM

## 2011-09-13 ENCOUNTER — Other Ambulatory Visit (HOSPITAL_BASED_OUTPATIENT_CLINIC_OR_DEPARTMENT_OTHER): Payer: BC Managed Care – PPO

## 2011-09-13 ENCOUNTER — Ambulatory Visit (HOSPITAL_BASED_OUTPATIENT_CLINIC_OR_DEPARTMENT_OTHER): Payer: BC Managed Care – PPO

## 2011-09-13 VITALS — BP 131/76 | HR 63 | Temp 98.1°F

## 2011-09-13 DIAGNOSIS — D509 Iron deficiency anemia, unspecified: Secondary | ICD-10-CM

## 2011-09-13 DIAGNOSIS — D649 Anemia, unspecified: Secondary | ICD-10-CM

## 2011-09-13 LAB — CBC WITH DIFFERENTIAL/PLATELET
BASO%: 0.6 % (ref 0.0–2.0)
Basophils Absolute: 0 10*3/uL (ref 0.0–0.1)
EOS%: 4.4 % (ref 0.0–7.0)
Eosinophils Absolute: 0.2 10*3/uL (ref 0.0–0.5)
HCT: 33.8 % — ABNORMAL LOW (ref 34.8–46.6)
HGB: 11.2 g/dL — ABNORMAL LOW (ref 11.6–15.9)
LYMPH%: 37.5 % (ref 14.0–49.7)
MCH: 24.9 pg — ABNORMAL LOW (ref 25.1–34.0)
MCHC: 33.1 g/dL (ref 31.5–36.0)
MCV: 75.3 fL — ABNORMAL LOW (ref 79.5–101.0)
MONO#: 0.4 10*3/uL (ref 0.1–0.9)
MONO%: 6.6 % (ref 0.0–14.0)
NEUT#: 2.7 10*3/uL (ref 1.5–6.5)
NEUT%: 50.9 % (ref 38.4–76.8)
Platelets: 208 10*3/uL (ref 145–400)
RBC: 4.49 10*6/uL (ref 3.70–5.45)
RDW: 20.6 % — ABNORMAL HIGH (ref 11.2–14.5)
WBC: 5.3 10*3/uL (ref 3.9–10.3)
lymph#: 2 10*3/uL (ref 0.9–3.3)
nRBC: 0 % (ref 0–0)

## 2011-09-13 MED ORDER — FERUMOXYTOL INJECTION 510 MG/17 ML
510.0000 mg | Freq: Once | INTRAVENOUS | Status: AC
  Administered 2011-09-13: 510 mg via INTRAVENOUS
  Filled 2011-09-13: qty 17

## 2011-09-13 MED ORDER — SODIUM CHLORIDE 0.9 % IV SOLN
Freq: Once | INTRAVENOUS | Status: AC
  Administered 2011-09-13: 09:00:00 via INTRAVENOUS

## 2011-09-13 NOTE — Patient Instructions (Signed)
Ferumoxytol injection What is this medicine? FERUMOXYTOL is an iron complex. Iron is used to make healthy red blood cells, which carry oxygen and nutrients throughout the body. This medicine is used to treat iron deficiency anemia in people with chronic kidney disease. This medicine may be used for other purposes; ask your health care provider or pharmacist if you have questions. What should I tell my health care provider before I take this medicine? They need to know if you have any of these conditions: -anemia not caused by low iron levels -high levels of iron in the blood -magnetic resonance imaging (MRI) test scheduled -an unusual or allergic reaction to iron, other medicines, foods, dyes, or preservatives -pregnant or trying to get pregnant -breast-feeding How should I use this medicine? This medicine is for infusion into a vein. It is given by a health care professional in a hospital or clinic setting. Talk to your pediatrician regarding the use of this medicine in children. Special care may be needed. Overdosage: If you think you've taken too much of this medicine contact a poison control center or emergency room at once. Overdosage: If you think you have taken too much of this medicine contact a poison control center or emergency room at once. NOTE: This medicine is only for you. Do not share this medicine with others. What if I miss a dose? It is important not to miss your dose. Call your doctor or health care professional if you are unable to keep an appointment. What may interact with this medicine? This medicine may interact with the following medications: -other iron products This list may not describe all possible interactions. Give your health care provider a list of all the medicines, herbs, non-prescription drugs, or dietary supplements you use. Also tell them if you smoke, drink alcohol, or use illegal drugs. Some items may interact with your medicine. What should I watch  for while using this medicine? Visit your doctor or healthcare professional regularly. Tell your doctor or healthcare professional if your symptoms do not start to get better or if they get worse. You may need blood work done while you are taking this medicine. You may need to follow a special diet. Talk to your doctor. Foods that contain iron include: whole grains/cereals, dried fruits, beans, or peas, leafy green vegetables, and organ meats (liver, kidney). What side effects may I notice from receiving this medicine? Side effects that you should report to your doctor or health care professional as soon as possible: -allergic reactions like skin rash, itching or hives, swelling of the face, lips, or tongue -breathing problems -changes in blood pressure -feeling faint or lightheaded, falls -fever or chills -flushing, sweating, or hot feelings -swelling of the ankles or feet Side effects that usually do not require medical attention (Report these to your doctor or health care professional if they continue or are bothersome.): -diarrhea -headache -nausea, vomiting -stomach pain This list may not describe all possible side effects. Call your doctor for medical advice about side effects. You may report side effects to FDA at 1-800-FDA-1088. Where should I keep my medicine? This drug is given in a hospital or clinic and will not be stored at home. NOTE: This sheet is a summary. It may not cover all possible information. If you have questions about this medicine, talk to your doctor, pharmacist, or health care provider.  2012, Elsevier/Gold Standard. (09/11/2007 9:48:25 PM) 

## 2011-09-13 NOTE — Progress Notes (Signed)
Per pharmacy request, need to assess whether pt is pregnant or breastfeeding.  Pt states she is not breast feeding or pregnant.  SLJ

## 2011-10-10 ENCOUNTER — Other Ambulatory Visit: Payer: Self-pay | Admitting: *Deleted

## 2011-10-10 DIAGNOSIS — D649 Anemia, unspecified: Secondary | ICD-10-CM

## 2011-10-11 ENCOUNTER — Ambulatory Visit (HOSPITAL_BASED_OUTPATIENT_CLINIC_OR_DEPARTMENT_OTHER): Payer: BC Managed Care – PPO

## 2011-10-11 ENCOUNTER — Other Ambulatory Visit (HOSPITAL_BASED_OUTPATIENT_CLINIC_OR_DEPARTMENT_OTHER): Payer: BC Managed Care – PPO

## 2011-10-11 VITALS — BP 121/65 | HR 72 | Temp 98.6°F

## 2011-10-11 DIAGNOSIS — D649 Anemia, unspecified: Secondary | ICD-10-CM

## 2011-10-11 DIAGNOSIS — D509 Iron deficiency anemia, unspecified: Secondary | ICD-10-CM

## 2011-10-11 LAB — CBC WITH DIFFERENTIAL/PLATELET
BASO%: 0.8 % (ref 0.0–2.0)
Basophils Absolute: 0.1 10*3/uL (ref 0.0–0.1)
EOS%: 4 % (ref 0.0–7.0)
Eosinophils Absolute: 0.2 10*3/uL (ref 0.0–0.5)
HCT: 37.7 % (ref 34.8–46.6)
HGB: 12.6 g/dL (ref 11.6–15.9)
LYMPH%: 29.8 % (ref 14.0–49.7)
MCH: 26.4 pg (ref 25.1–34.0)
MCHC: 33.4 g/dL (ref 31.5–36.0)
MCV: 79 fL — ABNORMAL LOW (ref 79.5–101.0)
MONO#: 0.4 10*3/uL (ref 0.1–0.9)
MONO%: 6 % (ref 0.0–14.0)
NEUT#: 3.6 10*3/uL (ref 1.5–6.5)
NEUT%: 59.4 % (ref 38.4–76.8)
Platelets: 218 10*3/uL (ref 145–400)
RBC: 4.77 10*6/uL (ref 3.70–5.45)
RDW: 21.2 % — ABNORMAL HIGH (ref 11.2–14.5)
WBC: 6 10*3/uL (ref 3.9–10.3)
lymph#: 1.8 10*3/uL (ref 0.9–3.3)
nRBC: 0 % (ref 0–0)

## 2011-10-11 LAB — FERRITIN: Ferritin: 42 ng/mL (ref 10–291)

## 2011-10-11 MED ORDER — SODIUM CHLORIDE 0.9 % IV SOLN
Freq: Once | INTRAVENOUS | Status: AC
  Administered 2011-10-11: 09:00:00 via INTRAVENOUS

## 2011-10-11 MED ORDER — FERUMOXYTOL INJECTION 510 MG/17 ML
510.0000 mg | Freq: Once | INTRAVENOUS | Status: AC
  Administered 2011-10-11: 510 mg via INTRAVENOUS
  Filled 2011-10-11: qty 17

## 2011-10-11 NOTE — Patient Instructions (Signed)
Ferumoxytol injection What is this medicine? FERUMOXYTOL is an iron complex. Iron is used to make healthy red blood cells, which carry oxygen and nutrients throughout the body. This medicine is used to treat iron deficiency anemia in people with chronic kidney disease. This medicine may be used for other purposes; ask your health care provider or pharmacist if you have questions. What should I tell my health care provider before I take this medicine? They need to know if you have any of these conditions: -anemia not caused by low iron levels -high levels of iron in the blood -magnetic resonance imaging (MRI) test scheduled -an unusual or allergic reaction to iron, other medicines, foods, dyes, or preservatives -pregnant or trying to get pregnant -breast-feeding How should I use this medicine? This medicine is for infusion into a vein. It is given by a health care professional in a hospital or clinic setting. Talk to your pediatrician regarding the use of this medicine in children. Special care may be needed. Overdosage: If you think you've taken too much of this medicine contact a poison control center or emergency room at once. Overdosage: If you think you have taken too much of this medicine contact a poison control center or emergency room at once. NOTE: This medicine is only for you. Do not share this medicine with others. What if I miss a dose? It is important not to miss your dose. Call your doctor or health care professional if you are unable to keep an appointment. What may interact with this medicine? This medicine may interact with the following medications: -other iron products This list may not describe all possible interactions. Give your health care provider a list of all the medicines, herbs, non-prescription drugs, or dietary supplements you use. Also tell them if you smoke, drink alcohol, or use illegal drugs. Some items may interact with your medicine. What should I watch  for while using this medicine? Visit your doctor or healthcare professional regularly. Tell your doctor or healthcare professional if your symptoms do not start to get better or if they get worse. You may need blood work done while you are taking this medicine. You may need to follow a special diet. Talk to your doctor. Foods that contain iron include: whole grains/cereals, dried fruits, beans, or peas, leafy green vegetables, and organ meats (liver, kidney). What side effects may I notice from receiving this medicine? Side effects that you should report to your doctor or health care professional as soon as possible: -allergic reactions like skin rash, itching or hives, swelling of the face, lips, or tongue -breathing problems -changes in blood pressure -feeling faint or lightheaded, falls -fever or chills -flushing, sweating, or hot feelings -swelling of the ankles or feet Side effects that usually do not require medical attention (Report these to your doctor or health care professional if they continue or are bothersome.): -diarrhea -headache -nausea, vomiting -stomach pain This list may not describe all possible side effects. Call your doctor for medical advice about side effects. You may report side effects to FDA at 1-800-FDA-1088. Where should I keep my medicine? This drug is given in a hospital or clinic and will not be stored at home. NOTE: This sheet is a summary. It may not cover all possible information. If you have questions about this medicine, talk to your doctor, pharmacist, or health care provider.  2012, Elsevier/Gold Standard. (09/11/2007 9:48:25 PM) 

## 2011-10-12 ENCOUNTER — Other Ambulatory Visit: Payer: Self-pay | Admitting: Oncology

## 2012-01-15 ENCOUNTER — Other Ambulatory Visit: Payer: Self-pay | Admitting: *Deleted

## 2012-01-15 DIAGNOSIS — D649 Anemia, unspecified: Secondary | ICD-10-CM

## 2012-01-16 ENCOUNTER — Other Ambulatory Visit (HOSPITAL_BASED_OUTPATIENT_CLINIC_OR_DEPARTMENT_OTHER): Payer: BC Managed Care – PPO | Admitting: Lab

## 2012-01-16 DIAGNOSIS — D649 Anemia, unspecified: Secondary | ICD-10-CM

## 2012-01-22 ENCOUNTER — Other Ambulatory Visit: Payer: BC Managed Care – PPO

## 2012-03-26 ENCOUNTER — Telehealth: Payer: Self-pay | Admitting: *Deleted

## 2012-03-26 NOTE — Telephone Encounter (Signed)
Pt is aware of her appt change..she requested that i mail her letter/cal. i will put it in the mail today.

## 2012-04-15 ENCOUNTER — Other Ambulatory Visit: Payer: Self-pay | Admitting: Physician Assistant

## 2012-04-15 DIAGNOSIS — D649 Anemia, unspecified: Secondary | ICD-10-CM

## 2012-04-16 ENCOUNTER — Other Ambulatory Visit: Payer: BC Managed Care – PPO | Admitting: Lab

## 2012-07-16 ENCOUNTER — Other Ambulatory Visit: Payer: Self-pay | Admitting: Physician Assistant

## 2012-07-16 ENCOUNTER — Other Ambulatory Visit (HOSPITAL_BASED_OUTPATIENT_CLINIC_OR_DEPARTMENT_OTHER): Payer: BC Managed Care – PPO | Admitting: Lab

## 2012-07-16 DIAGNOSIS — D649 Anemia, unspecified: Secondary | ICD-10-CM

## 2012-07-16 LAB — CBC & DIFF AND RETIC
BASO%: 0.5 % (ref 0.0–2.0)
Basophils Absolute: 0 10*3/uL (ref 0.0–0.1)
EOS%: 4.2 % (ref 0.0–7.0)
Eosinophils Absolute: 0.3 10*3/uL (ref 0.0–0.5)
HCT: 34.6 % — ABNORMAL LOW (ref 34.8–46.6)
HGB: 11.3 g/dL — ABNORMAL LOW (ref 11.6–15.9)
Immature Retic Fract: 22.2 % — ABNORMAL HIGH (ref 1.60–10.00)
LYMPH%: 28.9 % (ref 14.0–49.7)
MCH: 24.8 pg — ABNORMAL LOW (ref 25.1–34.0)
MCHC: 32.7 g/dL (ref 31.5–36.0)
MCV: 76 fL — ABNORMAL LOW (ref 79.5–101.0)
MONO#: 0.4 10*3/uL (ref 0.1–0.9)
MONO%: 6.9 % (ref 0.0–14.0)
NEUT#: 3.7 10*3/uL (ref 1.5–6.5)
NEUT%: 59.5 % (ref 38.4–76.8)
Platelets: 238 10*3/uL (ref 145–400)
RBC: 4.55 10*6/uL (ref 3.70–5.45)
RDW: 15.2 % — ABNORMAL HIGH (ref 11.2–14.5)
Retic %: 2.36 % — ABNORMAL HIGH (ref 0.70–2.10)
Retic Ct Abs: 107.38 10*3/uL — ABNORMAL HIGH (ref 33.70–90.70)
WBC: 6.1 10*3/uL (ref 3.9–10.3)
lymph#: 1.8 10*3/uL (ref 0.9–3.3)

## 2012-07-16 LAB — FERRITIN CHCC: Ferritin: 6 ng/ml — ABNORMAL LOW (ref 9–269)

## 2012-07-29 ENCOUNTER — Other Ambulatory Visit: Payer: Self-pay | Admitting: *Deleted

## 2012-07-31 ENCOUNTER — Telehealth: Payer: Self-pay | Admitting: *Deleted

## 2012-07-31 NOTE — Telephone Encounter (Signed)
Per staff message and POF I have scheduled appts.  JMW  

## 2012-08-01 ENCOUNTER — Telehealth: Payer: Self-pay | Admitting: Oncology

## 2012-08-07 ENCOUNTER — Other Ambulatory Visit: Payer: Self-pay | Admitting: Physician Assistant

## 2012-08-07 DIAGNOSIS — D649 Anemia, unspecified: Secondary | ICD-10-CM

## 2012-08-08 ENCOUNTER — Ambulatory Visit (HOSPITAL_BASED_OUTPATIENT_CLINIC_OR_DEPARTMENT_OTHER): Payer: BC Managed Care – PPO | Admitting: Oncology

## 2012-08-08 ENCOUNTER — Ambulatory Visit (HOSPITAL_BASED_OUTPATIENT_CLINIC_OR_DEPARTMENT_OTHER): Payer: BC Managed Care – PPO

## 2012-08-08 ENCOUNTER — Other Ambulatory Visit (HOSPITAL_BASED_OUTPATIENT_CLINIC_OR_DEPARTMENT_OTHER): Payer: BC Managed Care – PPO | Admitting: Lab

## 2012-08-08 ENCOUNTER — Telehealth: Payer: Self-pay | Admitting: *Deleted

## 2012-08-08 VITALS — BP 117/75 | HR 89 | Temp 99.3°F | Resp 20 | Ht 68.5 in | Wt 346.1 lb

## 2012-08-08 DIAGNOSIS — K912 Postsurgical malabsorption, not elsewhere classified: Secondary | ICD-10-CM

## 2012-08-08 DIAGNOSIS — D649 Anemia, unspecified: Secondary | ICD-10-CM

## 2012-08-08 DIAGNOSIS — N920 Excessive and frequent menstruation with regular cycle: Secondary | ICD-10-CM

## 2012-08-08 DIAGNOSIS — Z9884 Bariatric surgery status: Secondary | ICD-10-CM

## 2012-08-08 DIAGNOSIS — D500 Iron deficiency anemia secondary to blood loss (chronic): Secondary | ICD-10-CM

## 2012-08-08 DIAGNOSIS — D508 Other iron deficiency anemias: Secondary | ICD-10-CM

## 2012-08-08 DIAGNOSIS — D509 Iron deficiency anemia, unspecified: Secondary | ICD-10-CM

## 2012-08-08 LAB — CBC & DIFF AND RETIC
BASO%: 0.4 % (ref 0.0–2.0)
Basophils Absolute: 0 10*3/uL (ref 0.0–0.1)
EOS%: 5.3 % (ref 0.0–7.0)
Eosinophils Absolute: 0.4 10*3/uL (ref 0.0–0.5)
HCT: 34.2 % — ABNORMAL LOW (ref 34.8–46.6)
HGB: 11.3 g/dL — ABNORMAL LOW (ref 11.6–15.9)
Immature Retic Fract: 28.8 % — ABNORMAL HIGH (ref 1.60–10.00)
LYMPH%: 34.7 % (ref 14.0–49.7)
MCH: 24.1 pg — ABNORMAL LOW (ref 25.1–34.0)
MCHC: 33 g/dL (ref 31.5–36.0)
MCV: 73.1 fL — ABNORMAL LOW (ref 79.5–101.0)
MONO#: 0.5 10*3/uL (ref 0.1–0.9)
MONO%: 6.3 % (ref 0.0–14.0)
NEUT#: 3.8 10*3/uL (ref 1.5–6.5)
NEUT%: 53.3 % (ref 38.4–76.8)
Platelets: 261 10*3/uL (ref 145–400)
RBC: 4.68 10*6/uL (ref 3.70–5.45)
RDW: 15.6 % — ABNORMAL HIGH (ref 11.2–14.5)
Retic %: 2.29 % — ABNORMAL HIGH (ref 0.70–2.10)
Retic Ct Abs: 107.17 10*3/uL — ABNORMAL HIGH (ref 33.70–90.70)
WBC: 7.2 10*3/uL (ref 3.9–10.3)
lymph#: 2.5 10*3/uL (ref 0.9–3.3)
nRBC: 0 % (ref 0–0)

## 2012-08-08 LAB — FERRITIN CHCC: Ferritin: 8 ng/ml — ABNORMAL LOW (ref 9–269)

## 2012-08-08 MED ORDER — SODIUM CHLORIDE 0.9 % IV SOLN
1020.0000 mg | Freq: Once | INTRAVENOUS | Status: AC
  Administered 2012-08-08: 1020 mg via INTRAVENOUS
  Filled 2012-08-08: qty 34

## 2012-08-08 NOTE — Patient Instructions (Addendum)
Ferumoxytol injection What is this medicine? FERUMOXYTOL is an iron complex. Iron is used to make healthy red blood cells, which carry oxygen and nutrients throughout the body. This medicine is used to treat iron deficiency anemia in people with chronic kidney disease. This medicine may be used for other purposes; ask your health care provider or pharmacist if you have questions. What should I tell my health care provider before I take this medicine? They need to know if you have any of these conditions: -anemia not caused by low iron levels -high levels of iron in the blood -magnetic resonance imaging (MRI) test scheduled -an unusual or allergic reaction to iron, other medicines, foods, dyes, or preservatives -pregnant or trying to get pregnant -breast-feeding How should I use this medicine? This medicine is for infusion into a vein. It is given by a health care professional in a hospital or clinic setting. Talk to your pediatrician regarding the use of this medicine in children. Special care may be needed. Overdosage: If you think you've taken too much of this medicine contact a poison control center or emergency room at once. Overdosage: If you think you have taken too much of this medicine contact a poison control center or emergency room at once. NOTE: This medicine is only for you. Do not share this medicine with others. What if I miss a dose? It is important not to miss your dose. Call your doctor or health care professional if you are unable to keep an appointment. What may interact with this medicine? This medicine may interact with the following medications: -other iron products This list may not describe all possible interactions. Give your health care provider a list of all the medicines, herbs, non-prescription drugs, or dietary supplements you use. Also tell them if you smoke, drink alcohol, or use illegal drugs. Some items may interact with your medicine. What should I watch  for while using this medicine? Visit your doctor or healthcare professional regularly. Tell your doctor or healthcare professional if your symptoms do not start to get better or if they get worse. You may need blood work done while you are taking this medicine. You may need to follow a special diet. Talk to your doctor. Foods that contain iron include: whole grains/cereals, dried fruits, beans, or peas, leafy green vegetables, and organ meats (liver, kidney). What side effects may I notice from receiving this medicine? Side effects that you should report to your doctor or health care professional as soon as possible: -allergic reactions like skin rash, itching or hives, swelling of the face, lips, or tongue -breathing problems -changes in blood pressure -feeling faint or lightheaded, falls -fever or chills -flushing, sweating, or hot feelings -swelling of the ankles or feet Side effects that usually do not require medical attention (Report these to your doctor or health care professional if they continue or are bothersome.): -diarrhea -headache -nausea, vomiting -stomach pain This list may not describe all possible side effects. Call your doctor for medical advice about side effects. You may report side effects to FDA at 1-800-FDA-1088. Where should I keep my medicine? This drug is given in a hospital or clinic and will not be stored at home. NOTE: This sheet is a summary. It may not cover all possible information. If you have questions about this medicine, talk to your doctor, pharmacist, or health care provider.  2013, Elsevier/Gold Standard. (09/11/2007 9:48:25 PM)  

## 2012-08-08 NOTE — Telephone Encounter (Signed)
appts made and printed...td 

## 2012-08-08 NOTE — Progress Notes (Signed)
ID: Charlene [name]   DOB: July 16, 1970  MR#: [id number]  WUJ#:[id number]  PCP: Hollice Espy, MD GYN: Noland Fordyce SU:  OTHER MD:   HISTORY OF PRESENT ILLNESS: The patient had a gastric bypass for weight control in 2002.  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  She became significantly anemic and was receiving intravenous iron under Dr. Huston Foley in [place], [place].  She has now moved to this area and would like to continue to receive the intravenous iron on an as needed basis.   INTERVAL HISTORY: Charlene [name] returns for a followup of her iron deficiency anemia. She continues to have heavy periods. She's been told by her gynecologist that if she loses 50 pounds she can have a hysterectomy. She is starting on an exercise program. We discussed that at length today.  REVIEW OF SYSTEMS: Aside from the menorrhagia, she is feeling more fatigued than usual. There've been no palpitations, no dizziness or gait imbalance. A detailed review of systems today was otherwise entirely negative.  PAST MEDICAL HISTORY: Past Medical History  Diagnosis Date  . No pertinent past medical history   . Lactating mother 07/13/2010    PAST SURGICAL HISTORY: Past Surgical History  Procedure Laterality Date  . Gastric bypass  2000  . Hernia repair  2003  . Ventral hernia repair  2004  Aside from the gastric bypass, is only significant for herniorrhaphy and tonsillectomy and adenoidectomy.   FAMILY HISTORY Family History  Problem Relation Age of Onset  . Hypertension Mother   . Heart disease Father   . Hypertension Father   The patient's parents are living.  The patient has 3 brothers and 1 sister.  The sister had a stroke at age 75 in the setting of drug abuse.  Otherwise, the family is doing well.  There is no history of bleeding or clotting problems in the family.    GYNECOLOGIC HISTORY: She is G2, F1, P0, A1, L1.  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  Her husband, Jaynie Collins, works in Clinical biochemist.  They have a son, Felecia Jan, 23 and a daughter Payton Alexandra,81 years old  ADVANCED DIRECTIVES: not in place  HEALTH MAINTENANCE: History  Substance Use Topics  . Smoking status: Never Smoker   . Smokeless tobacco: Not on file  . Alcohol Use: Yes     Comment: social drinker     Colonoscopy:  PAP:  Bone density:  Lipid panel:  Allergies  Allergen Reactions  . Penicillins Hives and Shortness Of Breath    Heart rate increases    Current Outpatient Prescriptions  Medication Sig Dispense Refill  . Cholecalciferol (VITAMIN D) 2000 UNITS tablet Take 2,000 Units by mouth daily.        . IRON PO Take 1 tablet by mouth daily.        . prenatal vitamin w/FE, FA (PRENATAL 1 + 1) 27-1 MG TABS Take 1 tablet by mouth daily.         No current facility-administered medications for this visit.    OBJECTIVE: Young African American woman in no acute distress  Filed Vitals:   08/08/12 0923  BP: 117/75  Pulse: 89  Temp: 99.3 F (37.4 C)  Resp: 20     Body mass index is 51.85 kg/(m^2).    ECOG FS: 0  Sclerae  unicteric Oropharynx clear No cervical or supraclavicular adenopathy Lungs no rales or rhonchi Heart regular rate and rhythm Abd obese, benign MSK no focal spinal tenderness, no peripheral edema Neuro: nonfocal, well oriented, appropriate affect Breasts: Deferred  LAB RESULTS: Lab Results  Component Value Date   WBC 7.2 08/08/2012   NEUTROABS 3.8 08/08/2012   HGB 11.3* 08/08/2012   HCT 34.2* 08/08/2012   MCV 73.1* 08/08/2012   PLT 261 08/08/2012      Chemistry      Component Value Date/Time   NA 137 11/08/2005 1559   K 4.4 11/08/2005 1559   CL 106 11/08/2005 1559   CO2 24 11/08/2005 1559   BUN 10 11/08/2005 1559   CREATININE 0.75 11/08/2005 1559       Component Value Date/Time   CALCIUM 8.3* 11/08/2005 1559   ALKPHOS 120* 11/08/2005 1559   AST 18 11/08/2005 1559   ALT 17 11/08/2005 1559   BILITOT 0.3 11/08/2005 1559      No results found for this basename: LABCA2    No components found with this basename: LABCA125    No results found for this basename: INR,  in the last 168 hours  Urinalysis    Component Value Date/Time   COLORURINE YELLOW 07/10/2010 1022   APPEARANCEUR CLEAR 07/10/2010 1022   LABSPEC 1.010 07/10/2010 1022   PHURINE 6.0 07/10/2010 1022   GLUCOSEU NEGATIVE 07/10/2010 1022   HGBUR SMALL* 07/10/2010 1022   BILIRUBINUR NEGATIVE 07/10/2010 1022   KETONESUR NEGATIVE 07/10/2010 1022   PROTEINUR NEGATIVE 07/10/2010 1022   UROBILINOGEN 0.2 07/10/2010 1022   NITRITE NEGATIVE 07/10/2010 1022   LEUKOCYTESUR TRACE* 07/10/2010 1022    STUDIES: No results found.   ASSESSMENT: 42 y.o. 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   PLAN:  We are proceeding with iron infusion today. She is hoping to lose enough weight to be able to undergo a hysterectomy safely. We discussed Depo purpura but she tells me last time she tried that she bled for a month.  The plan accordingly is going to be to continue to check labs every 3 months, visits once a year, and iron infusions as needed until she has her uterus removed or undergoes menopause.  MAGRINAT,GUSTAV C    08/08/2012

## 2012-08-15 ENCOUNTER — Ambulatory Visit (HOSPITAL_BASED_OUTPATIENT_CLINIC_OR_DEPARTMENT_OTHER): Payer: BC Managed Care – PPO

## 2012-08-15 ENCOUNTER — Other Ambulatory Visit: Payer: BC Managed Care – PPO | Admitting: Lab

## 2012-08-15 ENCOUNTER — Ambulatory Visit: Payer: BC Managed Care – PPO | Admitting: Oncology

## 2012-08-15 VITALS — BP 134/82 | HR 72 | Temp 98.1°F | Resp 18

## 2012-08-15 DIAGNOSIS — D649 Anemia, unspecified: Secondary | ICD-10-CM

## 2012-08-15 DIAGNOSIS — D509 Iron deficiency anemia, unspecified: Secondary | ICD-10-CM

## 2012-08-15 MED ORDER — SODIUM CHLORIDE 0.9 % IV SOLN
1020.0000 mg | Freq: Once | INTRAVENOUS | Status: AC
  Administered 2012-08-15: 1020 mg via INTRAVENOUS
  Filled 2012-08-15: qty 34

## 2012-08-15 MED ORDER — SODIUM CHLORIDE 0.9 % IV SOLN
Freq: Once | INTRAVENOUS | Status: AC
  Administered 2012-08-15: 11:00:00 via INTRAVENOUS

## 2012-08-15 NOTE — Patient Instructions (Addendum)
Ferumoxytol injection What is this medicine? FERUMOXYTOL is an iron complex. Iron is used to make healthy red blood cells, which carry oxygen and nutrients throughout the body. This medicine is used to treat iron deficiency anemia in people with chronic kidney disease. This medicine may be used for other purposes; ask your health care provider or pharmacist if you have questions. What should I tell my health care provider before I take this medicine? They need to know if you have any of these conditions: -anemia not caused by low iron levels -high levels of iron in the blood -magnetic resonance imaging (MRI) test scheduled -an unusual or allergic reaction to iron, other medicines, foods, dyes, or preservatives -pregnant or trying to get pregnant -breast-feeding How should I use this medicine? This medicine is for infusion into a vein. It is given by a health care professional in a hospital or clinic setting. Talk to your pediatrician regarding the use of this medicine in children. Special care may be needed. Overdosage: If you think you've taken too much of this medicine contact a poison control center or emergency room at once. Overdosage: If you think you have taken too much of this medicine contact a poison control center or emergency room at once. NOTE: This medicine is only for you. Do not share this medicine with others. What if I miss a dose? It is important not to miss your dose. Call your doctor or health care professional if you are unable to keep an appointment. What may interact with this medicine? This medicine may interact with the following medications: -other iron products This list may not describe all possible interactions. Give your health care provider a list of all the medicines, herbs, non-prescription drugs, or dietary supplements you use. Also tell them if you smoke, drink alcohol, or use illegal drugs. Some items may interact with your medicine. What should I watch  for while using this medicine? Visit your doctor or healthcare professional regularly. Tell your doctor or healthcare professional if your symptoms do not start to get better or if they get worse. You may need blood work done while you are taking this medicine. You may need to follow a special diet. Talk to your doctor. Foods that contain iron include: whole grains/cereals, dried fruits, beans, or peas, leafy green vegetables, and organ meats (liver, kidney). What side effects may I notice from receiving this medicine? Side effects that you should report to your doctor or health care professional as soon as possible: -allergic reactions like skin rash, itching or hives, swelling of the face, lips, or tongue -breathing problems -changes in blood pressure -feeling faint or lightheaded, falls -fever or chills -flushing, sweating, or hot feelings -swelling of the ankles or feet Side effects that usually do not require medical attention (Report these to your doctor or health care professional if they continue or are bothersome.): -diarrhea -headache -nausea, vomiting -stomach pain This list may not describe all possible side effects. Call your doctor for medical advice about side effects. You may report side effects to FDA at 1-800-FDA-1088. Where should I keep my medicine? This drug is given in a hospital or clinic and will not be stored at home. NOTE: This sheet is a summary. It may not cover all possible information. If you have questions about this medicine, talk to your doctor, pharmacist, or health care provider.  2013, Elsevier/Gold Standard. (09/11/2007 9:48:25 PM)  

## 2012-09-25 ENCOUNTER — Encounter (HOSPITAL_BASED_OUTPATIENT_CLINIC_OR_DEPARTMENT_OTHER): Payer: Self-pay | Admitting: *Deleted

## 2012-09-25 ENCOUNTER — Emergency Department (HOSPITAL_BASED_OUTPATIENT_CLINIC_OR_DEPARTMENT_OTHER)
Admission: EM | Admit: 2012-09-25 | Discharge: 2012-09-25 | Disposition: A | Discharge status: Home / Self Care | Payer: BC Managed Care – PPO | Attending: Emergency Medicine | Admitting: Emergency Medicine

## 2012-09-25 DIAGNOSIS — Z880 Allergy status to penicillin: Secondary | ICD-10-CM | POA: Insufficient documentation

## 2012-09-25 DIAGNOSIS — R519 Headache, unspecified: Secondary | ICD-10-CM

## 2012-09-25 DIAGNOSIS — H538 Other visual disturbances: Secondary | ICD-10-CM

## 2012-09-25 DIAGNOSIS — Z79899 Other long term (current) drug therapy: Secondary | ICD-10-CM | POA: Insufficient documentation

## 2012-09-25 DIAGNOSIS — R51 Headache: Secondary | ICD-10-CM | POA: Insufficient documentation

## 2012-09-25 HISTORY — DX: Essential (primary) hypertension: I10

## 2012-09-25 LAB — URINALYSIS, ROUTINE W REFLEX MICROSCOPIC
Bilirubin Urine: NEGATIVE
Glucose, UA: NEGATIVE mg/dL
Hgb urine dipstick: NEGATIVE
Ketones, ur: NEGATIVE mg/dL
Leukocytes, UA: NEGATIVE
Nitrite: NEGATIVE
Protein, ur: NEGATIVE mg/dL
Specific Gravity, Urine: 1.016 (ref 1.005–1.030)
Urobilinogen, UA: 0.2 mg/dL (ref 0.0–1.0)
pH: 6 (ref 5.0–8.0)

## 2012-09-25 LAB — BASIC METABOLIC PANEL
BUN: 12 mg/dL (ref 6–23)
CO2: 28 mEq/L (ref 19–32)
Calcium: 9.6 mg/dL (ref 8.4–10.5)
Chloride: 103 mEq/L (ref 96–112)
Creatinine, Ser: 0.7 mg/dL (ref 0.50–1.10)
GFR calc Af Amer: >90 mL/min (ref >90)
GFR calc non Af Amer: >90 mL/min (ref >90)
Glucose, Bld: 89 mg/dL (ref 70–99)
Potassium: 3.7 mEq/L (ref 3.5–5.1)
Sodium: 140 mEq/L (ref 135–145)

## 2012-09-25 LAB — CBC
HCT: 36.4 % (ref 36.0–46.0)
Hemoglobin: 12.5 g/dL (ref 12.0–15.0)
MCH: 27.9 pg (ref 26.0–34.0)
MCHC: 34.3 g/dL (ref 30.0–36.0)
MCV: 81.3 fL (ref 78.0–100.0)
Platelets: 224 10*3/uL (ref 150–400)
RBC: 4.48 MIL/uL (ref 3.87–5.11)
RDW: 22.4 % — ABNORMAL HIGH (ref 11.5–15.5)
WBC: 7.2 10*3/uL (ref 4.0–10.5)

## 2012-09-25 LAB — GLUCOSE, CAPILLARY: Glucose-Capillary: 98 mg/dL (ref 70–99)

## 2012-09-25 LAB — TROPONIN I: Troponin I: <0.3 ng/mL (ref <0.30)

## 2012-09-25 MED ORDER — SODIUM CHLORIDE 0.9 % IV SOLN
Freq: Once | INTRAVENOUS | Status: AC
  Administered 2012-09-25: 14:00:00 via INTRAVENOUS

## 2012-09-25 MED ORDER — METOCLOPRAMIDE HCL 5 MG/ML IJ SOLN
10.0000 mg | Freq: Once | INTRAMUSCULAR | Status: AC
  Administered 2012-09-25: 10 mg via INTRAVENOUS
  Filled 2012-09-25: qty 2

## 2012-09-25 MED ORDER — DEXAMETHASONE SODIUM PHOSPHATE 10 MG/ML IJ SOLN
10.0000 mg | Freq: Once | INTRAMUSCULAR | Status: AC
  Administered 2012-09-25: 10 mg via INTRAVENOUS
  Filled 2012-09-25: qty 1

## 2012-09-25 MED ORDER — DIPHENHYDRAMINE HCL 50 MG/ML IJ SOLN
25.0000 mg | Freq: Once | INTRAMUSCULAR | Status: AC
  Administered 2012-09-25: 25 mg via INTRAVENOUS
  Filled 2012-09-25: qty 1

## 2012-09-25 NOTE — ED Provider Notes (Signed)
CSN: [id number]     Arrival date & time 09/25/12  1208 History   First MD Initiated Contact with Patient 09/25/12 1224     Chief Complaint  Patient presents with  . Hypertension   (Consider location/radiation/quality/duration/timing/severity/associated sxs/prior Treatment) Patient is a 42 y.o. female presenting with headaches. The history is provided by the patient.  Headache Pain location:  R temporal and L temporal Quality:  Dull (pressure) Severity currently:  5/10 Severity at highest:  5/10 Onset quality:  Sudden Timing:  Constant Progression:  Unchanged Chronicity:  New Similar to prior headaches: no   Context: not exposure to bright light, not caffeine, not coughing, not intercourse and not straining   Relieved by:  Nothing Worsened by:  Nothing tried Ineffective treatments:  None tried Associated symptoms: visual change (blurry vision)   Associated symptoms: no abdominal pain, no diarrhea, no dizziness, no ear pain, no pain, no fever, no loss of balance, no nausea, no neck stiffness, no numbness, no paresthesias, no sinus pressure, no sore throat, no syncope, no tingling, no vomiting and no weakness     Past Medical History  Diagnosis Date  . No pertinent past medical history   . Lactating mother 07/13/2010  . Hypertension    Past Surgical History  Procedure Laterality Date  . Gastric bypass  2000  . Hernia repair  2003  . Ventral hernia repair  2004   Family History  Problem Relation Age of Onset  . Hypertension Mother   . Heart disease Father   . Hypertension Father    History  Substance Use Topics  . Smoking status: Never Smoker   . Smokeless tobacco: Not on file  . Alcohol Use: Yes     Comment: social drinker   OB History   Grav Para Term Preterm Abortions TAB SAB Ect Mult Living   3 2 1 1 1     2      Review of Systems  Constitutional: Negative for fever.  HENT: Negative for ear pain, sore throat, neck stiffness and sinus pressure.   Eyes:  Negative for pain.  Cardiovascular: Negative for syncope.  Gastrointestinal: Negative for nausea, vomiting, abdominal pain and diarrhea.  Neurological: Positive for headaches. Negative for dizziness, numbness, paresthesias and loss of balance.  All other systems reviewed and are negative.    Allergies  Penicillins  Home Medications   Current Outpatient Rx  Name  Route  Sig  Dispense  Refill  . hydrochlorothiazide (MICROZIDE) 12.5 MG capsule   Oral   Take 12.5 mg by mouth daily.         . Cholecalciferol (VITAMIN D) 2000 UNITS tablet   Oral   Take 2,000 Units by mouth daily.           . IRON PO   Oral   Take 1 tablet by mouth daily.           . prenatal vitamin w/FE, FA (PRENATAL 1 + 1) 27-1 MG TABS   Oral   Take 1 tablet by mouth daily.            BP 140/80  Pulse 83  Temp(Src) 98.1 F (36.7 C) (Oral)  Resp 16  Ht 5\' 10"  (1.778 m)  Wt 325 lb (147.419 kg)  BMI 46.63 kg/m2  SpO2 100%  LMP 09/13/2012  Breastfeeding? No Physical Exam  Nursing note and vitals reviewed. Constitutional: She is oriented to person, place, and time. She appears well-developed and well-nourished. No distress.  HENT:  Head:  Normocephalic and atraumatic.  Eyes: EOM are normal. Pupils are equal, round, and reactive to light. Right eye exhibits no discharge. Left eye exhibits no discharge. No scleral icterus.  Neck: Normal range of motion. Neck supple.  Cardiovascular: Normal rate and regular rhythm.  Exam reveals no friction rub.   No murmur heard. Pulmonary/Chest: Effort normal and breath sounds normal. No respiratory distress. She has no wheezes. She has no rales.  Abdominal: Soft. She exhibits no distension. There is no tenderness. There is no rebound.  Musculoskeletal: Normal range of motion. She exhibits no edema.  Neurological: She is alert and oriented to person, place, and time. No cranial nerve deficit. She exhibits normal muscle tone. Coordination and gait normal. GCS eye  subscore is 4. GCS verbal subscore is 5. GCS motor subscore is 6.  Skin: She is not diaphoretic.    ED Course  Procedures (including critical care time) Labs Review Labs Reviewed  CBC - Abnormal; Notable for the following:    RDW 22.4 (*)    All other components within normal limits  GLUCOSE, CAPILLARY  BASIC METABOLIC PANEL  URINALYSIS, ROUTINE W REFLEX MICROSCOPIC  TROPONIN I   Imaging Review No results found.  MDM   1. Headache   2. Blurry vision    42 year old patient presents with headache, blurry vision and low blood sugar. She is at work and began having pressure behind her eyes for some associated blurry vision. Provisional clear after blinking, however returned very rapidly. Her blood pressure work was 140/86 and her blood sugar was 50. She was unable to drink or eat anything at work. Hereafter R. Schuster blood sugars 98. Patient reports continued pressure behind her eyes with blurry vision, however she states she's never had this before and is not history of migraines. She denies any numbness or weakness anywhere else. She denies any trauma. She denies any watery discharge or other redness or irritation. Here vitals are stable with BP of 140/80. She is normal pupil response without any discharge or conjunctival injection. She has normal cranial nerves, sensation, tone, normal gait. She is not dizzy. I do not feel her symptoms are consistent with a stroke. I do not feel she is a head CT. I will check some basic labs and treat her with headache cocktail to see if that improves. 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 I will check one troponin. Labs normal. Patient's symptoms improved after headache cocktail. Stable for discharge. Instructed to f/u with PCP in 2 days.  Dagmar Hait, MD 09/25/12 [id number]

## 2012-09-25 NOTE — ED Notes (Signed)
At work began having headache and blurred vision blood pressure was checked and found to be 140/86 checked cbg was 50

## 2012-10-29 ENCOUNTER — Other Ambulatory Visit (HOSPITAL_BASED_OUTPATIENT_CLINIC_OR_DEPARTMENT_OTHER): Payer: Self-pay | Admitting: Obstetrics

## 2012-10-29 DIAGNOSIS — Z1231 Encounter for screening mammogram for malignant neoplasm of breast: Secondary | ICD-10-CM

## 2012-10-30 ENCOUNTER — Ambulatory Visit (HOSPITAL_BASED_OUTPATIENT_CLINIC_OR_DEPARTMENT_OTHER)
Admission: RE | Admit: 2012-10-30 | Discharge: 2012-10-30 | Disposition: A | Discharge status: Home / Self Care | Payer: BC Managed Care – PPO | Source: Ambulatory Visit | Attending: Obstetrics | Admitting: Obstetrics

## 2012-10-30 DIAGNOSIS — Z1231 Encounter for screening mammogram for malignant neoplasm of breast: Secondary | ICD-10-CM

## 2012-10-31 ENCOUNTER — Other Ambulatory Visit (HOSPITAL_BASED_OUTPATIENT_CLINIC_OR_DEPARTMENT_OTHER): Payer: BC Managed Care – PPO | Admitting: Lab

## 2012-10-31 DIAGNOSIS — D508 Other iron deficiency anemias: Secondary | ICD-10-CM

## 2012-10-31 DIAGNOSIS — D500 Iron deficiency anemia secondary to blood loss (chronic): Secondary | ICD-10-CM

## 2012-10-31 LAB — CBC WITH DIFFERENTIAL/PLATELET
BASO%: 0.8 % (ref 0.0–2.0)
Basophils Absolute: 0.1 10*3/uL (ref 0.0–0.1)
EOS%: 4.4 % (ref 0.0–7.0)
Eosinophils Absolute: 0.3 10*3/uL (ref 0.0–0.5)
HCT: 36.5 % (ref 34.8–46.6)
HGB: 12.4 g/dL (ref 11.6–15.9)
LYMPH%: 26.8 % (ref 14.0–49.7)
MCH: 28.8 pg (ref 25.1–34.0)
MCHC: 33.9 g/dL (ref 31.5–36.0)
MCV: 84.9 fL (ref 79.5–101.0)
MONO#: 0.5 10*3/uL (ref 0.1–0.9)
MONO%: 7.4 % (ref 0.0–14.0)
NEUT#: 4.4 10*3/uL (ref 1.5–6.5)
NEUT%: 60.6 % (ref 38.4–76.8)
Platelets: 256 10*3/uL (ref 145–400)
RBC: 4.3 10*6/uL (ref 3.70–5.45)
RDW: 19.4 % — ABNORMAL HIGH (ref 11.2–14.5)
WBC: 7.2 10*3/uL (ref 3.9–10.3)
lymph#: 1.9 10*3/uL (ref 0.9–3.3)

## 2012-10-31 LAB — FERRITIN CHCC: Ferritin: 133 ng/ml (ref 9–269)

## 2012-11-07 ENCOUNTER — Other Ambulatory Visit: Payer: Self-pay

## 2013-01-23 ENCOUNTER — Other Ambulatory Visit (HOSPITAL_BASED_OUTPATIENT_CLINIC_OR_DEPARTMENT_OTHER): Payer: BC Managed Care – PPO

## 2013-01-23 DIAGNOSIS — D508 Other iron deficiency anemias: Secondary | ICD-10-CM

## 2013-01-23 DIAGNOSIS — D500 Iron deficiency anemia secondary to blood loss (chronic): Secondary | ICD-10-CM

## 2013-01-23 LAB — FERRITIN CHCC: Ferritin: 95 ng/ml (ref 9–269)

## 2013-01-23 LAB — CBC WITH DIFFERENTIAL/PLATELET
BASO%: 0.8 % (ref 0.0–2.0)
Basophils Absolute: 0.1 10*3/uL (ref 0.0–0.1)
EOS%: 5.6 % (ref 0.0–7.0)
Eosinophils Absolute: 0.3 10*3/uL (ref 0.0–0.5)
HCT: 41.5 % (ref 34.8–46.6)
HGB: 14.5 g/dL (ref 11.6–15.9)
LYMPH%: 27.9 % (ref 14.0–49.7)
MCH: 30.2 pg (ref 25.1–34.0)
MCHC: 34.8 g/dL (ref 31.5–36.0)
MCV: 86.8 fL (ref 79.5–101.0)
MONO#: 0.5 10*3/uL (ref 0.1–0.9)
MONO%: 7.7 % (ref 0.0–14.0)
NEUT#: 3.6 10*3/uL (ref 1.5–6.5)
NEUT%: 58 % (ref 38.4–76.8)
Platelets: 218 10*3/uL (ref 145–400)
RBC: 4.78 10*6/uL (ref 3.70–5.45)
RDW: 14.2 % (ref 11.2–14.5)
WBC: 6.2 10*3/uL (ref 3.9–10.3)
lymph#: 1.7 10*3/uL (ref 0.9–3.3)

## 2013-04-17 ENCOUNTER — Other Ambulatory Visit: Payer: BC Managed Care – PPO

## 2013-04-21 ENCOUNTER — Telehealth: Payer: Self-pay | Admitting: Oncology

## 2013-04-21 ENCOUNTER — Ambulatory Visit (HOSPITAL_BASED_OUTPATIENT_CLINIC_OR_DEPARTMENT_OTHER): Payer: BC Managed Care – PPO

## 2013-04-21 DIAGNOSIS — D508 Other iron deficiency anemias: Secondary | ICD-10-CM

## 2013-04-21 DIAGNOSIS — K912 Postsurgical malabsorption, not elsewhere classified: Secondary | ICD-10-CM

## 2013-04-21 DIAGNOSIS — D500 Iron deficiency anemia secondary to blood loss (chronic): Secondary | ICD-10-CM

## 2013-04-21 LAB — CBC WITH DIFFERENTIAL/PLATELET
BASO%: 0.7 % (ref 0.0–2.0)
Basophils Absolute: 0.1 10*3/uL (ref 0.0–0.1)
EOS%: 4.2 % (ref 0.0–7.0)
Eosinophils Absolute: 0.3 10*3/uL (ref 0.0–0.5)
HCT: 40.4 % (ref 34.8–46.6)
HGB: 14 g/dL (ref 11.6–15.9)
LYMPH%: 33.2 % (ref 14.0–49.7)
MCH: 29.7 pg (ref 25.1–34.0)
MCHC: 34.7 g/dL (ref 31.5–36.0)
MCV: 85.8 fL (ref 79.5–101.0)
MONO#: 0.4 10*3/uL (ref 0.1–0.9)
MONO%: 5.7 % (ref 0.0–14.0)
NEUT#: 4.3 10*3/uL (ref 1.5–6.5)
NEUT%: 56.2 % (ref 38.4–76.8)
Platelets: 258 10*3/uL (ref 145–400)
RBC: 4.71 10*6/uL (ref 3.70–5.45)
RDW: 14.8 % — ABNORMAL HIGH (ref 11.2–14.5)
WBC: 7.6 10*3/uL (ref 3.9–10.3)
lymph#: 2.5 10*3/uL (ref 0.9–3.3)

## 2013-04-21 LAB — FERRITIN CHCC: Ferritin: 56 ng/ml (ref 9–269)

## 2013-04-21 NOTE — Telephone Encounter (Signed)
Pt called and r/s lab missed to today

## 2013-07-10 ENCOUNTER — Other Ambulatory Visit (HOSPITAL_BASED_OUTPATIENT_CLINIC_OR_DEPARTMENT_OTHER): Payer: BC Managed Care – PPO

## 2013-07-10 DIAGNOSIS — D500 Iron deficiency anemia secondary to blood loss (chronic): Secondary | ICD-10-CM

## 2013-07-10 LAB — CBC WITH DIFFERENTIAL/PLATELET
BASO%: 1 % (ref 0.0–2.0)
Basophils Absolute: 0.1 10*3/uL (ref 0.0–0.1)
EOS%: 4 % (ref 0.0–7.0)
Eosinophils Absolute: 0.3 10*3/uL (ref 0.0–0.5)
HCT: 39 % (ref 34.8–46.6)
HGB: 13.1 g/dL (ref 11.6–15.9)
LYMPH%: 22.8 % (ref 14.0–49.7)
MCH: 28.9 pg (ref 25.1–34.0)
MCHC: 33.6 g/dL (ref 31.5–36.0)
MCV: 86 fL (ref 79.5–101.0)
MONO#: 0.5 10*3/uL (ref 0.1–0.9)
MONO%: 6.9 % (ref 0.0–14.0)
NEUT#: 5.1 10*3/uL (ref 1.5–6.5)
NEUT%: 65.3 % (ref 38.4–76.8)
Platelets: 243 10*3/uL (ref 145–400)
RBC: 4.53 10*6/uL (ref 3.70–5.45)
RDW: 14.2 % (ref 11.2–14.5)
WBC: 7.8 10*3/uL (ref 3.9–10.3)
lymph#: 1.8 10*3/uL (ref 0.9–3.3)

## 2013-07-10 LAB — FERRITIN CHCC: Ferritin: 39 ng/ml (ref 9–269)

## 2013-07-11 ENCOUNTER — Telehealth: Payer: Self-pay | Admitting: Oncology

## 2013-07-11 NOTE — Telephone Encounter (Signed)
per GM to r/s w/ APP-will call pt to adv of new time & date

## 2013-07-17 ENCOUNTER — Telehealth: Payer: Self-pay | Admitting: Adult Health

## 2013-07-17 NOTE — Telephone Encounter (Signed)
cld & left pt a message for r/s time & date of appt-will mail

## 2013-08-11 ENCOUNTER — Ambulatory Visit: Payer: BC Managed Care – PPO | Admitting: Oncology

## 2013-08-11 ENCOUNTER — Other Ambulatory Visit: Payer: BC Managed Care – PPO

## 2013-09-10 ENCOUNTER — Telehealth: Payer: Self-pay | Admitting: Adult Health

## 2013-09-10 NOTE — Telephone Encounter (Signed)
pt cld to r/s appt-pt aware of tim,e & date

## 2013-09-11 ENCOUNTER — Other Ambulatory Visit: Payer: BC Managed Care – PPO

## 2013-09-11 ENCOUNTER — Ambulatory Visit: Payer: BC Managed Care – PPO | Admitting: Adult Health

## 2013-09-16 ENCOUNTER — Other Ambulatory Visit: Payer: Self-pay | Admitting: Emergency Medicine

## 2013-09-16 DIAGNOSIS — D509 Iron deficiency anemia, unspecified: Secondary | ICD-10-CM

## 2013-09-17 ENCOUNTER — Ambulatory Visit (HOSPITAL_BASED_OUTPATIENT_CLINIC_OR_DEPARTMENT_OTHER): Payer: BC Managed Care – PPO | Admitting: Adult Health

## 2013-09-17 ENCOUNTER — Other Ambulatory Visit (HOSPITAL_BASED_OUTPATIENT_CLINIC_OR_DEPARTMENT_OTHER): Payer: BC Managed Care – PPO

## 2013-09-17 ENCOUNTER — Telehealth: Payer: Self-pay | Admitting: Adult Health

## 2013-09-17 ENCOUNTER — Encounter: Payer: Self-pay | Admitting: Adult Health

## 2013-09-17 VITALS — BP 121/60 | HR 71 | Temp 98.3°F | Resp 18 | Ht 70.0 in | Wt 367.8 lb

## 2013-09-17 DIAGNOSIS — K9089 Other intestinal malabsorption: Secondary | ICD-10-CM

## 2013-09-17 DIAGNOSIS — D508 Other iron deficiency anemias: Secondary | ICD-10-CM

## 2013-09-17 DIAGNOSIS — D509 Iron deficiency anemia, unspecified: Secondary | ICD-10-CM

## 2013-09-17 DIAGNOSIS — Z9884 Bariatric surgery status: Secondary | ICD-10-CM

## 2013-09-17 LAB — CBC WITH DIFFERENTIAL/PLATELET
BASO%: 0.8 % (ref 0.0–2.0)
Basophils Absolute: 0.1 10*3/uL (ref 0.0–0.1)
EOS%: 4 % (ref 0.0–7.0)
Eosinophils Absolute: 0.3 10*3/uL (ref 0.0–0.5)
HCT: 37.1 % (ref 34.8–46.6)
HGB: 12.2 g/dL (ref 11.6–15.9)
LYMPH%: 24.7 % (ref 14.0–49.7)
MCH: 26.9 pg (ref 25.1–34.0)
MCHC: 32.8 g/dL (ref 31.5–36.0)
MCV: 82 fL (ref 79.5–101.0)
MONO#: 0.5 10*3/uL (ref 0.1–0.9)
MONO%: 7.2 % (ref 0.0–14.0)
NEUT#: 4.2 10*3/uL (ref 1.5–6.5)
NEUT%: 63.3 % (ref 38.4–76.8)
Platelets: 231 10*3/uL (ref 145–400)
RBC: 4.52 10*6/uL (ref 3.70–5.45)
RDW: 14.7 % — ABNORMAL HIGH (ref 11.2–14.5)
WBC: 6.7 10*3/uL (ref 3.9–10.3)
lymph#: 1.7 10*3/uL (ref 0.9–3.3)

## 2013-09-17 LAB — FERRITIN CHCC: Ferritin: 21 ng/ml (ref 9–269)

## 2013-09-17 NOTE — Progress Notes (Signed)
ID: Charlene [name]   DOB: 18-May-1970  MR#: [id number]  VOH#:[id number]  PCP: Charlene Smolder, MD GYN: Charlene [name] SU:  OTHER MD:   HISTORY OF PRESENT ILLNESS: The patient had a gastric bypass for weight control in 2002.  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  She became significantly anemic and was receiving intravenous iron under Dr. Rockney [name] in [place], [place].  She has now moved to this area and would like to continue to receive the intravenous iron on an as needed basis.   INTERVAL HISTORY: Charlene [name] returns for a followup of her iron deficiency anemia. She continues to have heavy periods. She was last seen a year ago and hasn't had any luck with losing weight.  She does have a fitbit, which is a step in the right direction.  She tells me that she is looking into hiring a trainer.  She does feel as if her ferritin level is getting lower.  She is starting to eat ice again.  She is fatigued.  Otherwise, she denies any further questions, or concerns.   REVIEW OF SYSTEMS: A 10 point review of systems was conducted and is otherwise negative except for what is noted above.     PAST MEDICAL HISTORY: Past Medical History  Diagnosis Date  . No pertinent past medical history   . Lactating mother 07/13/2010  . Hypertension     PAST SURGICAL HISTORY: Past Surgical History  Procedure Laterality Date  . Gastric bypass  2000  . Hernia repair  2003  . Ventral hernia repair  2004  Aside from the gastric bypass, is only significant for herniorrhaphy and tonsillectomy and adenoidectomy.   FAMILY HISTORY Family History  Problem Relation Age of Onset  . Hypertension Mother   . Heart disease Father   . Hypertension Father   The patient's parents are living.  The patient has 3 brothers and 1 sister.  The sister had a stroke at age 45 in the setting  of drug abuse.  Otherwise, the family is doing well.  There is no history of bleeding or clotting problems in the family.   GYNECOLOGIC HISTORY: She is G2, F1, P0, A1, L1.  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  Her husband, Charlene [name], works in Therapist, art.  They have a son, Charlene [name], 55 and a daughter Charlene [name],43 years old    ADVANCED DIRECTIVES: not in place  HEALTH MAINTENANCE: History  Substance Use Topics  . Smoking status: Never Smoker   . Smokeless tobacco: Not on file  . Alcohol Use: Yes     Comment: social drinker     Colonoscopy:  PAP:  Bone density:  Lipid panel:  Allergies  Allergen Reactions  . Penicillins Hives and Shortness Of Breath    Heart rate increases    Current Outpatient Prescriptions  Medication Sig Dispense Refill  . Cholecalciferol (VITAMIN D) 2000 UNITS tablet Take 2,000 Units by mouth daily.        . hydrochlorothiazide (MICROZIDE) 12.5 MG capsule Take 12.5 mg by mouth daily.      . IRON PO Take 1 tablet by mouth daily.        . prenatal vitamin w/FE, FA (PRENATAL 1 + 1) 27-1 MG TABS Take 1 tablet by mouth daily.         No current  facility-administered medications for this visit.    OBJECTIVE: Young African American woman in no acute distress   Filed Vitals:   09/17/13 0944  BP: 121/60  Pulse: 71  Temp: 98.3 F (36.8 C)  Resp: 18     Body mass index is 52.77 kg/(m^2).    ECOG FS: 0  Sclerae unicteric Oropharynx clear No cervical or supraclavicular adenopathy Lungs no rales or rhonchi Heart regular rate and rhythm Abd obese, benign MSK no focal spinal tenderness, no peripheral edema Neuro: nonfocal, well oriented, appropriate affect Breasts: Deferred  LAB RESULTS: Lab Results  Component Value Date   WBC 6.7 09/17/2013   NEUTROABS 4.2 09/17/2013   HGB 12.2 09/17/2013   HCT 37.1 09/17/2013   MCV 82.0 09/17/2013   PLT 231 09/17/2013      Chemistry       Component Value Date/Time   NA 140 09/25/2012 1325   K 3.7 09/25/2012 1325   CL 103 09/25/2012 1325   CO2 28 09/25/2012 1325   BUN 12 09/25/2012 1325   CREATININE 0.70 09/25/2012 1325      Component Value Date/Time   CALCIUM 9.6 09/25/2012 1325   ALKPHOS 120* 11/08/2005 1559   AST 18 11/08/2005 1559   ALT 17 11/08/2005 1559   BILITOT 0.3 11/08/2005 1559      No results found for this basename: LABCA2    No components found with this basename: LABCA125    No results found for this basename: INR,  in the last 168 hours  Urinalysis    Component Value Date/Time   COLORURINE YELLOW 09/25/2012 1320   APPEARANCEUR CLEAR 09/25/2012 1320   LABSPEC 1.016 09/25/2012 1320   PHURINE 6.0 09/25/2012 1320   GLUCOSEU NEGATIVE 09/25/2012 1320   HGBUR NEGATIVE 09/25/2012 1320   BILIRUBINUR NEGATIVE 09/25/2012 1320   KETONESUR NEGATIVE 09/25/2012 1320   PROTEINUR NEGATIVE 09/25/2012 1320   UROBILINOGEN 0.2 09/25/2012 1320   NITRITE NEGATIVE 09/25/2012 1320   LEUKOCYTESUR NEGATIVE 09/25/2012 1320    STUDIES: No results found.   ASSESSMENT: 43 y.o. 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   PLAN:  Charlene [name] is doing well today.  She is fatiged.  She will likely need an iron infusion, in the near future as she is becoming symptomatic, and her hemoglobin and recent ferritin levels, though normal, are on the decline.  We talked about weight loss in detail.    Per Dr. Virgie Dad plan, we will continue to check labs every 3 months, visits once a year, and iron infusions as needed until she has her uterus removed or undergoes menopause.  I spent 15 minutes counseling the patient face to face.  The total time spent in the appointment was 30 minutes.  Charlene [name], [place] [phone] 09/17/2013

## 2013-09-17 NOTE — Telephone Encounter (Signed)
, °

## 2013-09-17 NOTE — Patient Instructions (Signed)

## 2013-09-23 ENCOUNTER — Other Ambulatory Visit: Payer: Self-pay | Admitting: *Deleted

## 2013-09-23 NOTE — Progress Notes (Signed)
Called pt with results of iron level with MD recommendation to proceed to iron infusion,.  POF placed.

## 2013-09-24 ENCOUNTER — Telehealth: Payer: Self-pay | Admitting: *Deleted

## 2013-09-24 ENCOUNTER — Telehealth: Payer: Self-pay | Admitting: Oncology

## 2013-09-24 NOTE — Telephone Encounter (Signed)
, °

## 2013-09-24 NOTE — Telephone Encounter (Signed)
Per staff message and POF I have scheduled appts. Advised scheduler of appts. JMW  

## 2013-09-25 ENCOUNTER — Telehealth: Payer: Self-pay | Admitting: *Deleted

## 2013-09-25 NOTE — Telephone Encounter (Signed)
Patient called and moved her appt from 9/25 to 10/2.

## 2013-09-26 ENCOUNTER — Ambulatory Visit: Payer: BC Managed Care – PPO

## 2013-10-03 ENCOUNTER — Ambulatory Visit (HOSPITAL_BASED_OUTPATIENT_CLINIC_OR_DEPARTMENT_OTHER): Payer: BC Managed Care – PPO

## 2013-10-03 VITALS — BP 152/84 | HR 66 | Temp 98.4°F | Resp 18

## 2013-10-03 DIAGNOSIS — D509 Iron deficiency anemia, unspecified: Secondary | ICD-10-CM

## 2013-10-03 MED ORDER — SODIUM CHLORIDE 0.9 % IV SOLN
1020.0000 mg | Freq: Once | INTRAVENOUS | Status: AC
  Administered 2013-10-03: 1020 mg via INTRAVENOUS
  Filled 2013-10-03: qty 34

## 2013-10-03 MED ORDER — SODIUM CHLORIDE 0.9 % IV SOLN
Freq: Once | INTRAVENOUS | Status: AC
  Administered 2013-10-03: 13:00:00 via INTRAVENOUS

## 2013-10-03 NOTE — Patient Instructions (Signed)

## 2013-10-08 ENCOUNTER — Telehealth: Payer: Self-pay | Admitting: Oncology

## 2013-10-08 NOTE — Telephone Encounter (Signed)
pt cld to r/s appt-gave pt mew ttimes & date-pt stated wrote them down

## 2013-11-03 ENCOUNTER — Encounter: Payer: Self-pay | Admitting: Adult Health

## 2013-11-10 ENCOUNTER — Other Ambulatory Visit (HOSPITAL_BASED_OUTPATIENT_CLINIC_OR_DEPARTMENT_OTHER): Payer: Self-pay | Admitting: Obstetrics

## 2013-11-10 DIAGNOSIS — Z1231 Encounter for screening mammogram for malignant neoplasm of breast: Secondary | ICD-10-CM

## 2013-11-11 ENCOUNTER — Ambulatory Visit (HOSPITAL_BASED_OUTPATIENT_CLINIC_OR_DEPARTMENT_OTHER)
Admission: RE | Admit: 2013-11-11 | Discharge: 2013-11-11 | Disposition: A | Discharge status: Home / Self Care | Payer: BC Managed Care – PPO | Source: Ambulatory Visit | Attending: Obstetrics | Admitting: Obstetrics

## 2013-11-11 DIAGNOSIS — Z1231 Encounter for screening mammogram for malignant neoplasm of breast: Secondary | ICD-10-CM | POA: Diagnosis not present

## 2013-12-17 ENCOUNTER — Other Ambulatory Visit: Payer: Self-pay | Admitting: Oncology

## 2013-12-17 ENCOUNTER — Other Ambulatory Visit: Payer: BC Managed Care – PPO

## 2013-12-17 ENCOUNTER — Other Ambulatory Visit: Payer: Self-pay | Admitting: Emergency Medicine

## 2013-12-17 DIAGNOSIS — C50919 Malignant neoplasm of unspecified site of unspecified female breast: Secondary | ICD-10-CM

## 2013-12-17 DIAGNOSIS — D509 Iron deficiency anemia, unspecified: Secondary | ICD-10-CM

## 2014-03-18 ENCOUNTER — Other Ambulatory Visit: Payer: BC Managed Care – PPO

## 2014-03-18 ENCOUNTER — Other Ambulatory Visit (HOSPITAL_BASED_OUTPATIENT_CLINIC_OR_DEPARTMENT_OTHER): Payer: BLUE CROSS/BLUE SHIELD

## 2014-03-18 DIAGNOSIS — D509 Iron deficiency anemia, unspecified: Secondary | ICD-10-CM

## 2014-03-18 DIAGNOSIS — C50919 Malignant neoplasm of unspecified site of unspecified female breast: Secondary | ICD-10-CM

## 2014-03-18 LAB — CBC WITH DIFFERENTIAL/PLATELET
BASO%: 0.5 % (ref 0.0–2.0)
Basophils Absolute: 0 10*3/uL (ref 0.0–0.1)
EOS%: 3.6 % (ref 0.0–7.0)
Eosinophils Absolute: 0.2 10*3/uL (ref 0.0–0.5)
HCT: 35.2 % (ref 34.8–46.6)
HGB: 12 g/dL (ref 11.6–15.9)
LYMPH%: 29.6 % (ref 14.0–49.7)
MCH: 28.7 pg (ref 25.1–34.0)
MCHC: 34.1 g/dL (ref 31.5–36.0)
MCV: 84.2 fL (ref 79.5–101.0)
MONO#: 0.5 10*3/uL (ref 0.1–0.9)
MONO%: 7.6 % (ref 0.0–14.0)
NEUT#: 3.7 10*3/uL (ref 1.5–6.5)
NEUT%: 58.7 % (ref 38.4–76.8)
Platelets: 197 10*3/uL (ref 145–400)
RBC: 4.18 10*6/uL (ref 3.70–5.45)
RDW: 14.1 % (ref 11.2–14.5)
WBC: 6.3 10*3/uL (ref 3.9–10.3)
lymph#: 1.9 10*3/uL (ref 0.9–3.3)

## 2014-03-18 LAB — COMPREHENSIVE METABOLIC PANEL (CC13)
ALT: 29 U/L (ref 0–55)
AST: 29 U/L (ref 5–34)
Albumin: 3.4 g/dL — ABNORMAL LOW (ref 3.5–5.0)
Alkaline Phosphatase: 79 U/L (ref 40–150)
Anion Gap: 9 mEq/L (ref 3–11)
BUN: 11.8 mg/dL (ref 7.0–26.0)
CO2: 26 mEq/L (ref 22–29)
Calcium: 8.4 mg/dL (ref 8.4–10.4)
Chloride: 108 mEq/L (ref 98–109)
Creatinine: 0.7 mg/dL (ref 0.6–1.1)
EGFR: >90 mL/min/{1.73_m2} (ref >90)
Glucose: 91 mg/dl (ref 70–140)
Potassium: 3.8 mEq/L (ref 3.5–5.1)
Sodium: 143 mEq/L (ref 136–145)
Total Bilirubin: 0.35 mg/dL (ref 0.20–1.20)
Total Protein: 6.8 g/dL (ref 6.4–8.3)

## 2014-06-17 ENCOUNTER — Other Ambulatory Visit: Payer: BC Managed Care – PPO

## 2014-06-17 ENCOUNTER — Other Ambulatory Visit (HOSPITAL_BASED_OUTPATIENT_CLINIC_OR_DEPARTMENT_OTHER): Payer: BLUE CROSS/BLUE SHIELD

## 2014-06-17 ENCOUNTER — Other Ambulatory Visit: Payer: Self-pay | Admitting: Nurse Practitioner

## 2014-06-17 DIAGNOSIS — C50919 Malignant neoplasm of unspecified site of unspecified female breast: Secondary | ICD-10-CM

## 2014-06-17 DIAGNOSIS — D509 Iron deficiency anemia, unspecified: Secondary | ICD-10-CM

## 2014-06-17 LAB — CBC WITH DIFFERENTIAL/PLATELET
BASO%: 1.1 % (ref 0.0–2.0)
Basophils Absolute: 0.1 10*3/uL (ref 0.0–0.1)
EOS%: 6.2 % (ref 0.0–7.0)
Eosinophils Absolute: 0.4 10*3/uL (ref 0.0–0.5)
HCT: 36.3 % (ref 34.8–46.6)
HGB: 12.2 g/dL (ref 11.6–15.9)
LYMPH%: 26.4 % (ref 14.0–49.7)
MCH: 26.8 pg (ref 25.1–34.0)
MCHC: 33.6 g/dL (ref 31.5–36.0)
MCV: 79.8 fL (ref 79.5–101.0)
MONO#: 0.6 10*3/uL (ref 0.1–0.9)
MONO%: 10.7 % (ref 0.0–14.0)
NEUT#: 3.3 10*3/uL (ref 1.5–6.5)
NEUT%: 55.6 % (ref 38.4–76.8)
Platelets: 200 10*3/uL (ref 145–400)
RBC: 4.54 10*6/uL (ref 3.70–5.45)
RDW: 15.4 % — ABNORMAL HIGH (ref 11.2–14.5)
WBC: 5.9 10*3/uL (ref 3.9–10.3)
lymph#: 1.6 10*3/uL (ref 0.9–3.3)

## 2014-06-17 LAB — COMPREHENSIVE METABOLIC PANEL (CC13)
ALT: 40 U/L (ref 0–55)
AST: 43 U/L — ABNORMAL HIGH (ref 5–34)
Albumin: 3.6 g/dL (ref 3.5–5.0)
Alkaline Phosphatase: 78 U/L (ref 40–150)
Anion Gap: 10 mEq/L (ref 3–11)
BUN: 10.3 mg/dL (ref 7.0–26.0)
CO2: 23 mEq/L (ref 22–29)
Calcium: 8.9 mg/dL (ref 8.4–10.4)
Chloride: 106 mEq/L (ref 98–109)
Creatinine: 0.9 mg/dL (ref 0.6–1.1)
EGFR: >90 mL/min/{1.73_m2} (ref >90)
Glucose: 105 mg/dl (ref 70–140)
Potassium: 4.1 mEq/L (ref 3.5–5.1)
Sodium: 139 mEq/L (ref 136–145)
Total Bilirubin: 0.71 mg/dL (ref 0.20–1.20)
Total Protein: 7 g/dL (ref 6.4–8.3)

## 2014-06-17 LAB — FERRITIN CHCC: Ferritin: 23 ng/ml (ref 9–269)

## 2014-09-01 ENCOUNTER — Telehealth: Payer: Self-pay | Admitting: Oncology

## 2014-09-01 NOTE — Telephone Encounter (Signed)
Returned Advertising account executive. Left message to move appointment with MD to either earlier or later, advise no availability in September with MD

## 2014-09-09 ENCOUNTER — Other Ambulatory Visit: Payer: BC Managed Care – PPO

## 2014-09-09 ENCOUNTER — Other Ambulatory Visit (HOSPITAL_BASED_OUTPATIENT_CLINIC_OR_DEPARTMENT_OTHER): Payer: BLUE CROSS/BLUE SHIELD

## 2014-09-09 DIAGNOSIS — D509 Iron deficiency anemia, unspecified: Secondary | ICD-10-CM | POA: Diagnosis not present

## 2014-09-09 DIAGNOSIS — C50919 Malignant neoplasm of unspecified site of unspecified female breast: Secondary | ICD-10-CM

## 2014-09-09 LAB — CBC WITH DIFFERENTIAL/PLATELET
BASO%: 0.1 % (ref 0.0–2.0)
Basophils Absolute: 0 10*3/uL (ref 0.0–0.1)
EOS%: 5.5 % (ref 0.0–7.0)
Eosinophils Absolute: 0.4 10*3/uL (ref 0.0–0.5)
HCT: 35.6 % (ref 34.8–46.6)
HGB: 11.7 g/dL (ref 11.6–15.9)
LYMPH%: 27.3 % (ref 14.0–49.7)
MCH: 25.4 pg (ref 25.1–34.0)
MCHC: 32.9 g/dL (ref 31.5–36.0)
MCV: 77.4 fL — ABNORMAL LOW (ref 79.5–101.0)
MONO#: 0.6 10*3/uL (ref 0.1–0.9)
MONO%: 9.1 % (ref 0.0–14.0)
NEUT#: 3.9 10*3/uL (ref 1.5–6.5)
NEUT%: 58 % (ref 38.4–76.8)
Platelets: 236 10*3/uL (ref 145–400)
RBC: 4.6 10*6/uL (ref 3.70–5.45)
RDW: 16.1 % — ABNORMAL HIGH (ref 11.2–14.5)
WBC: 6.7 10*3/uL (ref 3.9–10.3)
lymph#: 1.8 10*3/uL (ref 0.9–3.3)

## 2014-09-09 LAB — COMPREHENSIVE METABOLIC PANEL (CC13)
ALT: 34 U/L (ref 0–55)
AST: 39 U/L — ABNORMAL HIGH (ref 5–34)
Albumin: 3.8 g/dL (ref 3.5–5.0)
Alkaline Phosphatase: 81 U/L (ref 40–150)
Anion Gap: 9 mEq/L (ref 3–11)
BUN: 7.1 mg/dL (ref 7.0–26.0)
CO2: 25 mEq/L (ref 22–29)
Calcium: 8.8 mg/dL (ref 8.4–10.4)
Chloride: 106 mEq/L (ref 98–109)
Creatinine: 0.8 mg/dL (ref 0.6–1.1)
EGFR: >90 mL/min/{1.73_m2} (ref >90)
Glucose: 100 mg/dl (ref 70–140)
Potassium: 3.8 mEq/L (ref 3.5–5.1)
Sodium: 140 mEq/L (ref 136–145)
Total Bilirubin: 0.69 mg/dL (ref 0.20–1.20)
Total Protein: 7.4 g/dL (ref 6.4–8.3)

## 2014-09-15 ENCOUNTER — Telehealth: Payer: Self-pay | Admitting: Oncology

## 2014-09-15 NOTE — Telephone Encounter (Signed)
Returned Advertising account executive. Patient confirmed appointment moved from 09/14 to 10/03

## 2014-09-16 ENCOUNTER — Ambulatory Visit: Payer: BC Managed Care – PPO | Admitting: Oncology

## 2014-10-05 ENCOUNTER — Telehealth: Payer: Self-pay | Admitting: Nurse Practitioner

## 2014-10-05 ENCOUNTER — Ambulatory Visit (HOSPITAL_BASED_OUTPATIENT_CLINIC_OR_DEPARTMENT_OTHER): Payer: BLUE CROSS/BLUE SHIELD | Admitting: Oncology

## 2014-10-05 VITALS — BP 122/59 | HR 82 | Temp 99.2°F | Resp 18 | Ht 70.0 in | Wt 362.3 lb

## 2014-10-05 DIAGNOSIS — Z9884 Bariatric surgery status: Secondary | ICD-10-CM

## 2014-10-05 DIAGNOSIS — D509 Iron deficiency anemia, unspecified: Secondary | ICD-10-CM

## 2014-10-05 DIAGNOSIS — D500 Iron deficiency anemia secondary to blood loss (chronic): Secondary | ICD-10-CM

## 2014-10-05 NOTE — Telephone Encounter (Signed)
Appointments made and avs printed for patient °

## 2014-10-05 NOTE — Progress Notes (Signed)
ID: Charlene [name]   DOB: February 13, 1970  MR#: [id number]  KTG#:[id number]  PCP: Charlene Smolder, MD Charlene [name] SU:  OTHER MD:   HISTORY OF PRESENT ILLNESS: From the earlier summary note:  The patient had a gastric bypass for weight control in 2002.  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  She became significantly anemic and was receiving intravenous iron under Charlene [name] in [place], [place].  She has now moved to this area and would like to continue to receive the intravenous iron on an as needed basis.   INTERVAL HISTORY: Charlene [name] returns for a followup of her iron deficiency anemia. She tells me she is working with Charlene [name] to sulfa her menorrhagia problem. However she still has periods every 3-5 weeks and they still last between 7 and 10 days each.  REVIEW OF SYSTEMS: She denies chest pain or pressure, shortness of breath, or palpitations. She is feeling more tired. A detailed review of systems today was otherwise stable   PAST MEDICAL HISTORY: Past Medical History  Diagnosis Date  . No pertinent past medical history   . Lactating mother 07/13/2010  . Hypertension     PAST SURGICAL HISTORY: Past Surgical History  Procedure Laterality Date  . Gastric bypass  2000  . Hernia repair  2003  . Ventral hernia repair  2004  Aside from the gastric bypass, is only significant for herniorrhaphy and tonsillectomy and adenoidectomy.   FAMILY HISTORY Family History  Problem Relation Age of Onset  . Hypertension Mother   . Heart disease Father   . Hypertension Father   The patient's parents are living.  The patient has 3 brothers and 1 sister.  The sister had a stroke at age 56 in the setting of drug abuse.  Otherwise, the family is doing well.  There is no history of bleeding or clotting problems in the family.   GYNECOLOGIC  HISTORY: She is G2, F1, P0, A1, L1.  Periods are regular, last about a week, and are heavy  SOCIAL HISTORY:  She works for a Merchandiser, retail in [place].  Her husband, Charlene [name], works in Therapist, art.  They have a son, Charlene [name], 3 and a daughter Charlene [name], 4  ADVANCED DIRECTIVES: not in place  HEALTH MAINTENANCE: Social History  Substance Use Topics  . Smoking status: Never Smoker   . Smokeless tobacco: Not on file  . Alcohol Use: Yes     Comment: social drinker     Colonoscopy:  PAP:  Bone density:  Lipid panel:  Allergies  Allergen Reactions  . Penicillins Hives and Shortness Of Breath    Heart rate increases    Current Outpatient Prescriptions  Medication Sig Dispense Refill  . Cholecalciferol (VITAMIN D) 2000 UNITS tablet Take 2,000 Units by mouth daily.      . hydrochlorothiazide (MICROZIDE) 12.5 MG capsule Take 12.5 mg by mouth daily.    . IRON PO Take 1 tablet by mouth daily.      . prenatal vitamin w/FE, FA (PRENATAL 1 + 1) 27-1 MG TABS Take 1 tablet by mouth daily.       No current facility-administered medications for this visit.    OBJECTIVE: Young African American woman   Filed Vitals:   10/05/14 1127  BP: 122/59  Pulse: 82  Temp: 99.2 F (37.3 C)  Resp: 18     Body mass index is 51.98 kg/(m^2).  ECOG FS: 0  Sclerae unicteric, pupils round and equal Oropharynx clear and moist-- no thrush or other lesions No cervical or supraclavicular adenopathy Lungs no rales or rhonchi Heart regular rate and rhythm Abd soft, obese, nontender, positive bowel sounds MSK no focal spinal tenderness, no upper extremity lymphedema Neuro: nonfocal, well oriented, appropriate affect Breasts: Deferred    LAB RESULTS: Lab Results  Component Value Date   WBC 6.7 09/09/2014   NEUTROABS 3.9 09/09/2014   HGB 11.7 09/09/2014   HCT 35.6 09/09/2014   MCV 77.4* 09/09/2014   PLT 236 09/09/2014      Chemistry      Component Value Date/Time   NA 140 09/09/2014 0808    NA 140 09/25/2012 1325   K 3.8 09/09/2014 0808   K 3.7 09/25/2012 1325   CL 103 09/25/2012 1325   CO2 25 09/09/2014 0808   CO2 28 09/25/2012 1325   BUN 7.1 09/09/2014 0808   BUN 12 09/25/2012 1325   CREATININE 0.8 09/09/2014 0808   CREATININE 0.70 09/25/2012 1325      Component Value Date/Time   CALCIUM 8.8 09/09/2014 0808   CALCIUM 9.6 09/25/2012 1325   ALKPHOS 81 09/09/2014 0808   ALKPHOS 120* 11/08/2005 1559   AST 39* 09/09/2014 0808   AST 18 11/08/2005 1559   ALT 34 09/09/2014 0808   ALT 17 11/08/2005 1559   BILITOT 0.69 09/09/2014 0808   BILITOT 0.3 11/08/2005 1559      No results found for: LABCA2  No components found for: [id number]  No results for input(s): INR in the last 168 hours.  Urinalysis    Component Value Date/Time   COLORURINE YELLOW 09/25/2012 1320   APPEARANCEUR CLEAR 09/25/2012 1320   LABSPEC 1.016 09/25/2012 1320   PHURINE 6.0 09/25/2012 1320   GLUCOSEU NEGATIVE 09/25/2012 1320   HGBUR NEGATIVE 09/25/2012 1320   BILIRUBINUR NEGATIVE 09/25/2012 1320   KETONESUR NEGATIVE 09/25/2012 1320   PROTEINUR NEGATIVE 09/25/2012 1320   UROBILINOGEN 0.2 09/25/2012 1320   NITRITE NEGATIVE 09/25/2012 1320   LEUKOCYTESUR NEGATIVE 09/25/2012 1320    STUDIES: Mammography November 2015 was unremarkable   ASSESSMENT: 44 y.o. 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   PLAN:  Charlene [name]'s MCV continues to drop indicating progressive arm deficiency. She is beginning to be symptomatic from this. I think this is a good time to go ahead and replenish iron.  She has tolerated the Feraheme without any side effects in the past. She is going to receive infusions October 7 and 10/16/2014. She will be premedicated with Tylenol and Benadryl as before.  We will resume monitoring of her hemoglobin and ferritin in March and continue that every 3 months. She  will see [place] again in November of next year. Of course if she develops any further anemia symptoms or if she shows a significant drop in her hemoglobin or MCV we can return to [facility] as needed  She has a good understanding of this plan and agrees with it Chauncey Cruel, [place] [phone] 10/05/2014

## 2014-10-09 ENCOUNTER — Telehealth: Payer: Self-pay | Admitting: Oncology

## 2014-10-09 ENCOUNTER — Ambulatory Visit (HOSPITAL_BASED_OUTPATIENT_CLINIC_OR_DEPARTMENT_OTHER): Payer: BLUE CROSS/BLUE SHIELD

## 2014-10-09 VITALS — BP 114/62 | HR 83 | Temp 99.5°F | Resp 18

## 2014-10-09 DIAGNOSIS — D509 Iron deficiency anemia, unspecified: Secondary | ICD-10-CM

## 2014-10-09 DIAGNOSIS — D500 Iron deficiency anemia secondary to blood loss (chronic): Secondary | ICD-10-CM

## 2014-10-09 MED ORDER — ACETAMINOPHEN 325 MG PO TABS
650.0000 mg | ORAL_TABLET | Freq: Once | ORAL | Status: AC
  Administered 2014-10-09: 650 mg via ORAL

## 2014-10-09 MED ORDER — SODIUM CHLORIDE 0.9 % IV SOLN
510.0000 mg | Freq: Once | INTRAVENOUS | Status: AC
  Administered 2014-10-09: 510 mg via INTRAVENOUS
  Filled 2014-10-09: qty 17

## 2014-10-09 MED ORDER — DIPHENHYDRAMINE HCL 25 MG PO CAPS
ORAL_CAPSULE | ORAL | Status: AC
  Filled 2014-10-09: qty 1

## 2014-10-09 MED ORDER — DIPHENHYDRAMINE HCL 25 MG PO CAPS
25.0000 mg | ORAL_CAPSULE | Freq: Once | ORAL | Status: AC
  Administered 2014-10-09: 25 mg via ORAL

## 2014-10-09 MED ORDER — ACETAMINOPHEN 325 MG PO TABS
ORAL_TABLET | ORAL | Status: AC
  Filled 2014-10-09: qty 2

## 2014-10-09 NOTE — Telephone Encounter (Signed)
Patient called in to moved her 10/14 iron to an am appointment,moved and patient will get a new schedule/avs at iron tx today   anne

## 2014-10-09 NOTE — Patient Instructions (Signed)

## 2014-10-16 ENCOUNTER — Ambulatory Visit (HOSPITAL_BASED_OUTPATIENT_CLINIC_OR_DEPARTMENT_OTHER): Payer: BLUE CROSS/BLUE SHIELD

## 2014-10-16 ENCOUNTER — Ambulatory Visit: Payer: BLUE CROSS/BLUE SHIELD

## 2014-10-16 VITALS — BP 117/71 | HR 66 | Temp 98.1°F | Resp 18

## 2014-10-16 DIAGNOSIS — D500 Iron deficiency anemia secondary to blood loss (chronic): Secondary | ICD-10-CM

## 2014-10-16 DIAGNOSIS — D509 Iron deficiency anemia, unspecified: Secondary | ICD-10-CM | POA: Diagnosis not present

## 2014-10-16 MED ORDER — ACETAMINOPHEN 325 MG PO TABS
ORAL_TABLET | ORAL | Status: AC
Start: 2014-10-16 — End: 2014-10-16
  Filled 2014-10-16: qty 2

## 2014-10-16 MED ORDER — DIPHENHYDRAMINE HCL 25 MG PO CAPS
ORAL_CAPSULE | ORAL | Status: AC
  Filled 2014-10-16: qty 2

## 2014-10-16 MED ORDER — SODIUM CHLORIDE 0.9 % IV SOLN
510.0000 mg | Freq: Once | INTRAVENOUS | Status: AC
  Administered 2014-10-16: 510 mg via INTRAVENOUS
  Filled 2014-10-16: qty 17

## 2014-10-16 MED ORDER — ACETAMINOPHEN 325 MG PO TABS
650.0000 mg | ORAL_TABLET | Freq: Once | ORAL | Status: AC
  Administered 2014-10-16: 650 mg via ORAL

## 2014-10-16 MED ORDER — DIPHENHYDRAMINE HCL 25 MG PO CAPS
25.0000 mg | ORAL_CAPSULE | Freq: Once | ORAL | Status: AC
  Administered 2014-10-16: 25 mg via ORAL

## 2014-10-16 MED ORDER — SODIUM CHLORIDE 0.9 % IV SOLN
Freq: Once | INTRAVENOUS | Status: AC
  Administered 2014-10-16: 09:00:00 via INTRAVENOUS

## 2014-10-16 NOTE — Patient Instructions (Signed)

## 2014-10-16 NOTE — Progress Notes (Signed)
Pt monitored 30 minutes post feraheme infusion. Pt and VS stable at time of discharge.

## 2015-02-05 ENCOUNTER — Other Ambulatory Visit: Payer: Self-pay | Admitting: *Deleted

## 2015-02-05 DIAGNOSIS — D539 Nutritional anemia, unspecified: Secondary | ICD-10-CM

## 2015-02-08 ENCOUNTER — Other Ambulatory Visit: Payer: BLUE CROSS/BLUE SHIELD

## 2015-05-10 ENCOUNTER — Telehealth: Payer: Self-pay | Admitting: Oncology

## 2015-05-10 ENCOUNTER — Other Ambulatory Visit: Payer: BLUE CROSS/BLUE SHIELD

## 2015-05-10 NOTE — Telephone Encounter (Signed)
returned call and s.w pt and r/s appt due to being sick....pt ok and aware of new d.t

## 2015-05-18 ENCOUNTER — Other Ambulatory Visit: Payer: BLUE CROSS/BLUE SHIELD

## 2015-05-18 ENCOUNTER — Telehealth: Payer: Self-pay | Admitting: Oncology

## 2015-05-18 NOTE — Telephone Encounter (Signed)
returned call and s.w. pt and r/s lab....pt ok and aware °

## 2015-05-24 ENCOUNTER — Other Ambulatory Visit: Payer: BLUE CROSS/BLUE SHIELD

## 2015-07-26 ENCOUNTER — Other Ambulatory Visit (HOSPITAL_BASED_OUTPATIENT_CLINIC_OR_DEPARTMENT_OTHER): Payer: BLUE CROSS/BLUE SHIELD

## 2015-07-26 DIAGNOSIS — D539 Nutritional anemia, unspecified: Secondary | ICD-10-CM

## 2015-07-26 DIAGNOSIS — D509 Iron deficiency anemia, unspecified: Secondary | ICD-10-CM | POA: Diagnosis not present

## 2015-07-26 LAB — COMPREHENSIVE METABOLIC PANEL
ALT: 30 U/L (ref 0–55)
AST: 26 U/L (ref 5–34)
Albumin: 3.9 g/dL (ref 3.5–5.0)
Alkaline Phosphatase: 83 U/L (ref 40–150)
Anion Gap: 11 mEq/L (ref 3–11)
BUN: 9.3 mg/dL (ref 7.0–26.0)
CO2: 25 mEq/L (ref 22–29)
Calcium: 9 mg/dL (ref 8.4–10.4)
Chloride: 103 mEq/L (ref 98–109)
Creatinine: 0.8 mg/dL (ref 0.6–1.1)
EGFR: >90 mL/min/{1.73_m2} (ref >90)
Glucose: 114 mg/dl (ref 70–140)
Potassium: 3.7 mEq/L (ref 3.5–5.1)
Sodium: 139 mEq/L (ref 136–145)
Total Bilirubin: 0.68 mg/dL (ref 0.20–1.20)
Total Protein: 7.9 g/dL (ref 6.4–8.3)

## 2015-07-26 LAB — CBC WITH DIFFERENTIAL/PLATELET
BASO%: 1 % (ref 0.0–2.0)
Basophils Absolute: 0.1 10*3/uL (ref 0.0–0.1)
EOS%: 1.8 % (ref 0.0–7.0)
Eosinophils Absolute: 0.1 10*3/uL (ref 0.0–0.5)
HCT: 34.9 % (ref 34.8–46.6)
HGB: 11.2 g/dL — ABNORMAL LOW (ref 11.6–15.9)
LYMPH%: 19.8 % (ref 14.0–49.7)
MCH: 22.6 pg — ABNORMAL LOW (ref 25.1–34.0)
MCHC: 32.1 g/dL (ref 31.5–36.0)
MCV: 70.3 fL — ABNORMAL LOW (ref 79.5–101.0)
MONO#: 0.9 10*3/uL (ref 0.1–0.9)
MONO%: 11.9 % (ref 0.0–14.0)
NEUT#: 4.8 10*3/uL (ref 1.5–6.5)
NEUT%: 65.5 % (ref 38.4–76.8)
Platelets: 245 10*3/uL (ref 145–400)
RBC: 4.97 10*6/uL (ref 3.70–5.45)
RDW: 18 % — ABNORMAL HIGH (ref 11.2–14.5)
WBC: 7.4 10*3/uL (ref 3.9–10.3)
lymph#: 1.5 10*3/uL (ref 0.9–3.3)

## 2015-07-26 LAB — FERRITIN: Ferritin: 7 ng/ml — ABNORMAL LOW (ref 9–269)

## 2015-07-29 ENCOUNTER — Telehealth: Payer: Self-pay | Admitting: Oncology

## 2015-07-29 NOTE — Telephone Encounter (Signed)
Spoke with pt ot confirm 7/31 appts per staff orders

## 2015-07-30 ENCOUNTER — Other Ambulatory Visit: Payer: Self-pay | Admitting: Oncology

## 2015-07-30 ENCOUNTER — Ambulatory Visit (HOSPITAL_BASED_OUTPATIENT_CLINIC_OR_DEPARTMENT_OTHER): Payer: BLUE CROSS/BLUE SHIELD

## 2015-07-30 VITALS — BP 154/77 | HR 89 | Temp 98.7°F | Resp 16

## 2015-07-30 DIAGNOSIS — D509 Iron deficiency anemia, unspecified: Secondary | ICD-10-CM | POA: Diagnosis not present

## 2015-07-30 DIAGNOSIS — D508 Other iron deficiency anemias: Secondary | ICD-10-CM

## 2015-07-30 MED ORDER — DIPHENHYDRAMINE HCL 50 MG/ML IJ SOLN
INTRAMUSCULAR | Status: AC
  Filled 2015-07-30: qty 1

## 2015-07-30 MED ORDER — DIPHENHYDRAMINE HCL 50 MG/ML IJ SOLN
25.0000 mg | Freq: Once | INTRAMUSCULAR | Status: AC
  Administered 2015-07-30: 25 mg via INTRAVENOUS

## 2015-07-30 MED ORDER — SODIUM CHLORIDE 0.9 % IV SOLN
510.0000 mg | Freq: Once | INTRAVENOUS | Status: AC
  Administered 2015-07-30: 510 mg via INTRAVENOUS
  Filled 2015-07-30: qty 17

## 2015-08-06 ENCOUNTER — Ambulatory Visit: Payer: BLUE CROSS/BLUE SHIELD

## 2015-08-09 ENCOUNTER — Other Ambulatory Visit: Payer: Self-pay | Admitting: Oncology

## 2015-08-09 ENCOUNTER — Other Ambulatory Visit: Payer: BLUE CROSS/BLUE SHIELD

## 2015-08-13 ENCOUNTER — Ambulatory Visit (HOSPITAL_BASED_OUTPATIENT_CLINIC_OR_DEPARTMENT_OTHER): Payer: BLUE CROSS/BLUE SHIELD

## 2015-08-13 VITALS — BP 123/85 | HR 93 | Temp 97.7°F | Resp 16

## 2015-08-13 DIAGNOSIS — D508 Other iron deficiency anemias: Secondary | ICD-10-CM

## 2015-08-13 DIAGNOSIS — D509 Iron deficiency anemia, unspecified: Secondary | ICD-10-CM

## 2015-08-13 MED ORDER — SODIUM CHLORIDE 0.9 % IV SOLN
510.0000 mg | Freq: Once | INTRAVENOUS | Status: AC
  Administered 2015-08-13: 510 mg via INTRAVENOUS
  Filled 2015-08-13: qty 17

## 2015-08-13 MED ORDER — SODIUM CHLORIDE 0.9 % IV SOLN
INTRAVENOUS | Status: DC
Start: 2015-08-13 — End: 2015-08-13
  Administered 2015-08-13: 15:00:00 via INTRAVENOUS

## 2015-10-29 ENCOUNTER — Other Ambulatory Visit: Payer: Self-pay

## 2015-10-29 DIAGNOSIS — D500 Iron deficiency anemia secondary to blood loss (chronic): Secondary | ICD-10-CM

## 2015-11-01 ENCOUNTER — Other Ambulatory Visit (HOSPITAL_BASED_OUTPATIENT_CLINIC_OR_DEPARTMENT_OTHER): Payer: BLUE CROSS/BLUE SHIELD

## 2015-11-01 DIAGNOSIS — D509 Iron deficiency anemia, unspecified: Secondary | ICD-10-CM | POA: Diagnosis not present

## 2015-11-01 DIAGNOSIS — D500 Iron deficiency anemia secondary to blood loss (chronic): Secondary | ICD-10-CM

## 2015-11-01 LAB — COMPREHENSIVE METABOLIC PANEL
ALT: 30 U/L (ref 0–55)
AST: 34 U/L (ref 5–34)
Albumin: 3.8 g/dL (ref 3.5–5.0)
Alkaline Phosphatase: 84 U/L (ref 40–150)
Anion Gap: 9 mEq/L (ref 3–11)
BUN: 7.5 mg/dL (ref 7.0–26.0)
CO2: 25 mEq/L (ref 22–29)
Calcium: 8.7 mg/dL (ref 8.4–10.4)
Chloride: 105 mEq/L (ref 98–109)
Creatinine: 0.8 mg/dL (ref 0.6–1.1)
EGFR: >90 mL/min/{1.73_m2} (ref >90)
Glucose: 98 mg/dl (ref 70–140)
Potassium: 4.1 mEq/L (ref 3.5–5.1)
Sodium: 139 mEq/L (ref 136–145)
Total Bilirubin: 0.66 mg/dL (ref 0.20–1.20)
Total Protein: 7.6 g/dL (ref 6.4–8.3)

## 2015-11-01 LAB — CBC WITH DIFFERENTIAL/PLATELET
BASO%: 1.2 % (ref 0.0–2.0)
Basophils Absolute: 0.1 10*3/uL (ref 0.0–0.1)
EOS%: 3.4 % (ref 0.0–7.0)
Eosinophils Absolute: 0.2 10*3/uL (ref 0.0–0.5)
HCT: 38.7 % (ref 34.8–46.6)
HGB: 12.9 g/dL (ref 11.6–15.9)
LYMPH%: 29.7 % (ref 14.0–49.7)
MCH: 27.4 pg (ref 25.1–34.0)
MCHC: 33.4 g/dL (ref 31.5–36.0)
MCV: 82.2 fL (ref 79.5–101.0)
MONO#: 0.5 10*3/uL (ref 0.1–0.9)
MONO%: 9.6 % (ref 0.0–14.0)
NEUT#: 2.7 10*3/uL (ref 1.5–6.5)
NEUT%: 56.1 % (ref 38.4–76.8)
Platelets: 221 10*3/uL (ref 145–400)
RBC: 4.71 10*6/uL (ref 3.70–5.45)
RDW: 18.1 % — ABNORMAL HIGH (ref 11.2–14.5)
WBC: 4.8 10*3/uL (ref 3.9–10.3)
lymph#: 1.4 10*3/uL (ref 0.9–3.3)

## 2015-11-01 LAB — FERRITIN: Ferritin: 15 ng/ml (ref 9–269)

## 2015-11-08 ENCOUNTER — Ambulatory Visit: Payer: BLUE CROSS/BLUE SHIELD | Admitting: Nurse Practitioner

## 2015-11-15 ENCOUNTER — Telehealth: Payer: Self-pay | Admitting: Oncology

## 2015-11-15 ENCOUNTER — Ambulatory Visit (HOSPITAL_BASED_OUTPATIENT_CLINIC_OR_DEPARTMENT_OTHER): Payer: BLUE CROSS/BLUE SHIELD | Admitting: Oncology

## 2015-11-15 ENCOUNTER — Encounter: Payer: Self-pay | Admitting: Oncology

## 2015-11-15 VITALS — BP 146/70 | HR 84 | Temp 98.1°F | Resp 20 | Wt 369.8 lb

## 2015-11-15 DIAGNOSIS — Z9884 Bariatric surgery status: Secondary | ICD-10-CM | POA: Diagnosis not present

## 2015-11-15 DIAGNOSIS — D509 Iron deficiency anemia, unspecified: Secondary | ICD-10-CM

## 2015-11-15 NOTE — Progress Notes (Signed)
ID: Willaim [name]   DOB: 1970-07-31  MR#: [id number]  [id number]  PCP: Marjorie Smolder, MD GYN: Aloha Gell SU:  OTHER MD:   HISTORY OF PRESENT ILLNESS: From the earlier summary note:  The patient had a gastric bypass for weight control in 2002.  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  She became significantly anemic and was receiving intravenous iron under Dr. Rockney Ghee in [place], [place].  She has now moved to this area and would like to continue to receive the intravenous iron on an as needed basis.   INTERVAL HISTORY: Charlene [name] returns for a followup of her iron deficiency anemia. She tells me she is working with Dr. Pamala Hurry to manage her menorrhagia problem. However she still has periods every 3-5 weeks and they still last between 7 and 10 days each. She may have a hysterectomy in the near future pending some weight loss. Last Feraheme infusion in 08/2015.  REVIEW OF SYSTEMS: She denies chest pain or pressure, shortness of breath, or palpitations. A detailed review of systems today was otherwise stable   PAST MEDICAL HISTORY: Past Medical History:  Diagnosis Date  . Hypertension   . Lactating mother 07/13/2010  . No pertinent past medical history     PAST SURGICAL HISTORY: Past Surgical History:  Procedure Laterality Date  . GASTRIC BYPASS  2000  . HERNIA REPAIR  2003  . VENTRAL HERNIA REPAIR  2004  Aside from the gastric bypass, is only significant for herniorrhaphy and tonsillectomy and adenoidectomy.   FAMILY HISTORY Family History  Problem Relation Age of Onset  . Hypertension Mother   . Heart disease Father   . Hypertension Father   The patient's parents are living.  The patient has 3 brothers and 1 sister.  The sister had a stroke at age 38 in the setting of drug abuse.  Otherwise, the family is doing well.  There  is no history of bleeding or clotting problems in the family.   GYNECOLOGIC HISTORY: She is G2, F1, P0, A1, L1.  Periods are regular, last about a week, and are heavy  SOCIAL HISTORY:  She works for a Merchandiser, retail in [place].  Her husband, [name], works in Therapist, art.  They have a son, [name], 45 and a daughter [name], 5  ADVANCED DIRECTIVES: not in place  HEALTH MAINTENANCE: Social History  Substance Use Topics  . Smoking status: Never Smoker  . Smokeless tobacco: Not on file  . Alcohol use Yes     Comment: social drinker     Colonoscopy:  PAP:  Bone density:  Lipid panel:  Allergies  Allergen Reactions  . Penicillins Hives and Shortness Of Breath    Heart rate increases    Current Outpatient Prescriptions  Medication Sig Dispense Refill  . hydrochlorothiazide (MICROZIDE) 12.5 MG capsule Take 12.5 mg by mouth daily.    . Vitamin D, Ergocalciferol, (DRISDOL) 50000 units CAPS capsule Take 1 capsule by mouth once a week.     No current facility-administered medications for this visit.     OBJECTIVE: Young African American woman   Vitals:   11/15/15 0854  BP: (!) 146/70  Pulse: 84  Resp: 20  Temp: 98.1 F (36.7 C)     Body mass index is 53.06 kg/m.    ECOG FS: 0  Sclerae unicteric, pupils round and equal Oropharynx clear and moist-- no thrush or other lesions No cervical  or supraclavicular adenopathy Lungs no rales or rhonchi Heart regular rate and rhythm Abd soft, obese, nontender, positive bowel sounds MSK no focal spinal tenderness, no upper extremity lymphedema Neuro: nonfocal, well oriented, appropriate affect Breasts: Deferred    LAB RESULTS: Lab Results  Component Value Date   WBC 4.8 11/01/2015   NEUTROABS 2.7 11/01/2015   HGB 12.9 11/01/2015   HCT 38.7 11/01/2015   MCV 82.2 11/01/2015   PLT 221 11/01/2015      Chemistry      Component Value Date/Time   NA 139 11/01/2015 0818   K 4.1 11/01/2015 0818   CL 103 09/25/2012 1325    CO2 25 11/01/2015 0818   BUN 7.5 11/01/2015 0818   CREATININE 0.8 11/01/2015 0818      Component Value Date/Time   CALCIUM 8.7 11/01/2015 0818   ALKPHOS 84 11/01/2015 0818   AST 34 11/01/2015 0818   ALT 30 11/01/2015 0818   BILITOT 0.66 11/01/2015 0818      No results found for: LABCA2  No components found for: LABCA125  No results for input(s): INR in the last 168 hours.  Urinalysis    Component Value Date/Time   COLORURINE YELLOW 09/25/2012 1320   APPEARANCEUR CLEAR 09/25/2012 1320   LABSPEC 1.016 09/25/2012 1320   PHURINE 6.0 09/25/2012 1320   GLUCOSEU NEGATIVE 09/25/2012 1320   HGBUR NEGATIVE 09/25/2012 1320   BILIRUBINUR NEGATIVE 09/25/2012 1320   KETONESUR NEGATIVE 09/25/2012 1320   PROTEINUR NEGATIVE 09/25/2012 1320   UROBILINOGEN 0.2 09/25/2012 1320   NITRITE NEGATIVE 09/25/2012 1320   LEUKOCYTESUR NEGATIVE 09/25/2012 1320    STUDIES: Mammography November 2015 was unremarkable   ASSESSMENT: 45 y.o. 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   PLAN:  [place]'s MCV is normal today. Ferritin is normal as well. Will hold off on IV iron at this time, but will continue to check labs every 3 months.   If she drops, will give Feraheme. She has tolerated the Feraheme without any side effects in the past. She will be premedicated with Tylenol and Benadryl as before for future Feraheme infusions.  She will see [place] again in November of next year. Of course if she develops any further anemia symptoms or if she shows a significant drop in her hemoglobin or MCV we can return to [facility] as needed  She has a good understanding of this plan and agrees with it Mikey Bussing, [place] [phone] 11/15/2015

## 2015-11-15 NOTE — Telephone Encounter (Signed)
Labs scheduled every 3 months for 1 year, per 11/15/15 los. Appointments scheduled per 11/15/15 los. AVS report and appointment schedule given to patient per 11/15/15 los.

## 2016-02-01 ENCOUNTER — Telehealth: Payer: Self-pay | Admitting: Oncology

## 2016-02-01 ENCOUNTER — Other Ambulatory Visit: Payer: BLUE CROSS/BLUE SHIELD

## 2016-02-01 NOTE — Telephone Encounter (Signed)
Patient called to reschedule missed lab appointment for 1/30

## 2016-02-02 ENCOUNTER — Other Ambulatory Visit (HOSPITAL_BASED_OUTPATIENT_CLINIC_OR_DEPARTMENT_OTHER): Payer: BLUE CROSS/BLUE SHIELD

## 2016-02-02 DIAGNOSIS — D509 Iron deficiency anemia, unspecified: Secondary | ICD-10-CM

## 2016-02-02 DIAGNOSIS — D500 Iron deficiency anemia secondary to blood loss (chronic): Secondary | ICD-10-CM

## 2016-02-02 LAB — CBC WITH DIFFERENTIAL/PLATELET
BASO%: 1.1 % (ref 0.0–2.0)
Basophils Absolute: 0.1 10*3/uL (ref 0.0–0.1)
EOS%: 5.8 % (ref 0.0–7.0)
Eosinophils Absolute: 0.4 10*3/uL (ref 0.0–0.5)
HCT: 33.1 % — ABNORMAL LOW (ref 34.8–46.6)
HGB: 11 g/dL — ABNORMAL LOW (ref 11.6–15.9)
LYMPH%: 26.3 % (ref 14.0–49.7)
MCH: 25.6 pg (ref 25.1–34.0)
MCHC: 33.2 g/dL (ref 31.5–36.0)
MCV: 77 fL — ABNORMAL LOW (ref 79.5–101.0)
MONO#: 0.5 10*3/uL (ref 0.1–0.9)
MONO%: 7.8 % (ref 0.0–14.0)
NEUT#: 4 10*3/uL (ref 1.5–6.5)
NEUT%: 59 % (ref 38.4–76.8)
Platelets: 255 10*3/uL (ref 145–400)
RBC: 4.29 10*6/uL (ref 3.70–5.45)
RDW: 15.8 % — ABNORMAL HIGH (ref 11.2–14.5)
WBC: 6.8 10*3/uL (ref 3.9–10.3)
lymph#: 1.8 10*3/uL (ref 0.9–3.3)

## 2016-02-02 LAB — FERRITIN: Ferritin: 6 ng/ml — ABNORMAL LOW (ref 9–269)

## 2016-02-02 LAB — COMPREHENSIVE METABOLIC PANEL
ALT: 22 U/L (ref 0–55)
AST: 26 U/L (ref 5–34)
Albumin: 3.6 g/dL (ref 3.5–5.0)
Alkaline Phosphatase: 72 U/L (ref 40–150)
Anion Gap: 7 mEq/L (ref 3–11)
BUN: 7.2 mg/dL (ref 7.0–26.0)
CO2: 24 mEq/L (ref 22–29)
Calcium: 9.1 mg/dL (ref 8.4–10.4)
Chloride: 107 mEq/L (ref 98–109)
Creatinine: 0.7 mg/dL (ref 0.6–1.1)
EGFR: >90 mL/min/{1.73_m2} (ref >90)
Glucose: 86 mg/dl (ref 70–140)
Potassium: 4.2 mEq/L (ref 3.5–5.1)
Sodium: 138 mEq/L (ref 136–145)
Total Bilirubin: 0.41 mg/dL (ref 0.20–1.20)
Total Protein: 7.5 g/dL (ref 6.4–8.3)

## 2016-02-11 ENCOUNTER — Telehealth: Payer: Self-pay | Admitting: Oncology

## 2016-02-11 NOTE — Telephone Encounter (Signed)
lvm to infirm pt of infusion appts 2/19 and 2/26 per LOS

## 2016-02-16 ENCOUNTER — Telehealth: Payer: Self-pay | Admitting: Oncology

## 2016-02-16 NOTE — Telephone Encounter (Signed)
Patient called and moved next two iron infusions from Monday 2/19 and 2/26 to Friday 2/23 and 3/2. Per patient she needs Friday. Patient has new date/time.

## 2016-02-21 ENCOUNTER — Ambulatory Visit: Payer: BLUE CROSS/BLUE SHIELD

## 2016-02-25 ENCOUNTER — Other Ambulatory Visit: Payer: Self-pay | Admitting: Oncology

## 2016-02-25 ENCOUNTER — Ambulatory Visit (HOSPITAL_BASED_OUTPATIENT_CLINIC_OR_DEPARTMENT_OTHER): Payer: BLUE CROSS/BLUE SHIELD

## 2016-02-25 VITALS — BP 117/56 | HR 86 | Temp 99.3°F | Resp 17

## 2016-02-25 DIAGNOSIS — D509 Iron deficiency anemia, unspecified: Secondary | ICD-10-CM

## 2016-02-25 DIAGNOSIS — D500 Iron deficiency anemia secondary to blood loss (chronic): Secondary | ICD-10-CM

## 2016-02-25 MED ORDER — SODIUM CHLORIDE 0.9 % IV SOLN
510.0000 mg | Freq: Once | INTRAVENOUS | Status: AC
  Administered 2016-02-25: 510 mg via INTRAVENOUS
  Filled 2016-02-25: qty 17

## 2016-02-25 NOTE — Patient Instructions (Signed)
Ferumoxytol injection What is this medicine? FERUMOXYTOL is an iron complex. Iron is used to make healthy red blood cells, which carry oxygen and nutrients throughout the body. This medicine is used to treat iron deficiency anemia in people with chronic kidney disease. COMMON BRAND NAME(S): Feraheme What should I tell my health care provider before I take this medicine? They need to know if you have any of these conditions: -anemia not caused by low iron levels -high levels of iron in the blood -magnetic resonance imaging (MRI) test scheduled -an unusual or allergic reaction to iron, other medicines, foods, dyes, or preservatives -pregnant or trying to get pregnant -breast-feeding How should I use this medicine? This medicine is for injection into a vein. It is given by a health care professional in a hospital or clinic setting. Talk to your pediatrician regarding the use of this medicine in children. Special care may be needed. What if I miss a dose? It is important not to miss your dose. Call your doctor or health care professional if you are unable to keep an appointment. What may interact with this medicine? This medicine may interact with the following medications: -other iron products What should I watch for while using this medicine? Visit your doctor or healthcare professional regularly. Tell your doctor or healthcare professional if your symptoms do not start to get better or if they get worse. You may need blood work done while you are taking this medicine. You may need to follow a special diet. Talk to your doctor. Foods that contain iron include: whole grains/cereals, dried fruits, beans, or peas, leafy green vegetables, and organ meats (liver, kidney). What side effects may I notice from receiving this medicine? Side effects that you should report to your doctor or health care professional as soon as possible: -allergic reactions like skin rash, itching or hives, swelling of the  face, lips, or tongue -breathing problems -changes in blood pressure -feeling faint or lightheaded, falls -fever or chills -flushing, sweating, or hot feelings -swelling of the ankles or feet Side effects that usually do not require medical attention (report to your doctor or health care professional if they continue or are bothersome): -diarrhea -headache -nausea, vomiting -stomach pain Where should I keep my medicine? This drug is given in a hospital or clinic and will not be stored at home.  2017 Elsevier/Gold Standard (2015-01-21 12:41:49)  

## 2016-02-28 ENCOUNTER — Ambulatory Visit: Payer: BLUE CROSS/BLUE SHIELD

## 2016-03-03 ENCOUNTER — Ambulatory Visit (HOSPITAL_BASED_OUTPATIENT_CLINIC_OR_DEPARTMENT_OTHER): Payer: BLUE CROSS/BLUE SHIELD

## 2016-03-03 VITALS — BP 123/64 | HR 88 | Temp 98.4°F | Resp 18

## 2016-03-03 DIAGNOSIS — D500 Iron deficiency anemia secondary to blood loss (chronic): Secondary | ICD-10-CM

## 2016-03-03 DIAGNOSIS — D509 Iron deficiency anemia, unspecified: Secondary | ICD-10-CM

## 2016-03-03 MED ORDER — SODIUM CHLORIDE 0.9 % IV SOLN
510.0000 mg | Freq: Once | INTRAVENOUS | Status: AC
  Administered 2016-03-03: 510 mg via INTRAVENOUS
  Filled 2016-03-03: qty 17

## 2016-03-03 NOTE — Patient Instructions (Signed)

## 2016-03-27 ENCOUNTER — Other Ambulatory Visit: Payer: BLUE CROSS/BLUE SHIELD

## 2016-05-01 ENCOUNTER — Other Ambulatory Visit (HOSPITAL_BASED_OUTPATIENT_CLINIC_OR_DEPARTMENT_OTHER): Payer: BLUE CROSS/BLUE SHIELD

## 2016-05-01 DIAGNOSIS — D509 Iron deficiency anemia, unspecified: Secondary | ICD-10-CM | POA: Diagnosis not present

## 2016-05-01 LAB — CBC WITH DIFFERENTIAL/PLATELET
BASO%: 0.9 % (ref 0.0–2.0)
Basophils Absolute: 0.1 10*3/uL (ref 0.0–0.1)
EOS%: 5.2 % (ref 0.0–7.0)
Eosinophils Absolute: 0.3 10*3/uL (ref 0.0–0.5)
HCT: 36.8 % (ref 34.8–46.6)
HGB: 12.4 g/dL (ref 11.6–15.9)
LYMPH%: 27.9 % (ref 14.0–49.7)
MCH: 27.5 pg (ref 25.1–34.0)
MCHC: 33.6 g/dL (ref 31.5–36.0)
MCV: 81.8 fL (ref 79.5–101.0)
MONO#: 0.5 10*3/uL (ref 0.1–0.9)
MONO%: 8.3 % (ref 0.0–14.0)
NEUT#: 3.6 10*3/uL (ref 1.5–6.5)
NEUT%: 57.7 % (ref 38.4–76.8)
Platelets: 204 10*3/uL (ref 145–400)
RBC: 4.5 10*6/uL (ref 3.70–5.45)
RDW: 20 % — ABNORMAL HIGH (ref 11.2–14.5)
WBC: 6.2 10*3/uL (ref 3.9–10.3)
lymph#: 1.7 10*3/uL (ref 0.9–3.3)

## 2016-05-01 LAB — COMPREHENSIVE METABOLIC PANEL
ALT: 23 U/L (ref 0–55)
AST: 23 U/L (ref 5–34)
Albumin: 3.8 g/dL (ref 3.5–5.0)
Alkaline Phosphatase: 73 U/L (ref 40–150)
Anion Gap: 10 mEq/L (ref 3–11)
BUN: 11.4 mg/dL (ref 7.0–26.0)
CO2: 25 mEq/L (ref 22–29)
Calcium: 9 mg/dL (ref 8.4–10.4)
Chloride: 106 mEq/L (ref 98–109)
Creatinine: 0.8 mg/dL (ref 0.6–1.1)
EGFR: >90 mL/min/{1.73_m2} (ref >90)
Glucose: 94 mg/dl (ref 70–140)
Potassium: 3.7 mEq/L (ref 3.5–5.1)
Sodium: 141 mEq/L (ref 136–145)
Total Bilirubin: 0.46 mg/dL (ref 0.20–1.20)
Total Protein: 7.3 g/dL (ref 6.4–8.3)

## 2016-05-01 LAB — FERRITIN: Ferritin: 27 ng/ml (ref 9–269)

## 2016-06-21 ENCOUNTER — Other Ambulatory Visit: Payer: Self-pay | Admitting: Nurse Practitioner

## 2016-07-31 ENCOUNTER — Other Ambulatory Visit (HOSPITAL_BASED_OUTPATIENT_CLINIC_OR_DEPARTMENT_OTHER): Payer: BLUE CROSS/BLUE SHIELD

## 2016-07-31 DIAGNOSIS — D509 Iron deficiency anemia, unspecified: Secondary | ICD-10-CM | POA: Diagnosis not present

## 2016-07-31 LAB — FERRITIN: Ferritin: 10 ng/ml (ref 9–269)

## 2016-07-31 LAB — COMPREHENSIVE METABOLIC PANEL
ALT: 16 U/L (ref 0–55)
AST: 19 U/L (ref 5–34)
Albumin: 3.6 g/dL (ref 3.5–5.0)
Alkaline Phosphatase: 74 U/L (ref 40–150)
Anion Gap: 9 mEq/L (ref 3–11)
BUN: 8.6 mg/dL (ref 7.0–26.0)
CO2: 24 mEq/L (ref 22–29)
Calcium: 8.9 mg/dL (ref 8.4–10.4)
Chloride: 107 mEq/L (ref 98–109)
Creatinine: 0.8 mg/dL (ref 0.6–1.1)
EGFR: >90 mL/min/{1.73_m2} (ref >90)
Glucose: 112 mg/dl (ref 70–140)
Potassium: 3.7 mEq/L (ref 3.5–5.1)
Sodium: 140 mEq/L (ref 136–145)
Total Bilirubin: 0.35 mg/dL (ref 0.20–1.20)
Total Protein: 7.1 g/dL (ref 6.4–8.3)

## 2016-07-31 LAB — CBC WITH DIFFERENTIAL/PLATELET
BASO%: 0.8 % (ref 0.0–2.0)
Basophils Absolute: 0 10*3/uL (ref 0.0–0.1)
EOS%: 4.2 % (ref 0.0–7.0)
Eosinophils Absolute: 0.2 10*3/uL (ref 0.0–0.5)
HCT: 35.6 % (ref 34.8–46.6)
HGB: 11.8 g/dL (ref 11.6–15.9)
LYMPH%: 32.8 % (ref 14.0–49.7)
MCH: 27 pg (ref 25.1–34.0)
MCHC: 33.1 g/dL (ref 31.5–36.0)
MCV: 81.5 fL (ref 79.5–101.0)
MONO#: 0.3 10*3/uL (ref 0.1–0.9)
MONO%: 6.8 % (ref 0.0–14.0)
NEUT#: 2.6 10*3/uL (ref 1.5–6.5)
NEUT%: 55.4 % (ref 38.4–76.8)
Platelets: 237 10*3/uL (ref 145–400)
RBC: 4.37 10*6/uL (ref 3.70–5.45)
RDW: 14.7 % — ABNORMAL HIGH (ref 11.2–14.5)
WBC: 4.7 10*3/uL (ref 3.9–10.3)
lymph#: 1.6 10*3/uL (ref 0.9–3.3)

## 2016-08-10 ENCOUNTER — Telehealth: Payer: Self-pay | Admitting: *Deleted

## 2016-08-10 NOTE — Telephone Encounter (Signed)
"  My lab results show a low iron level.  Do I need to schedule an infusion?  Return number [phone]."    07-31-2016 lab results: Ferritin = 10, HGB = 11.8, MCV = 81.5.  05-01-2016 lab results Ferritin = 27, HGB = 12.4, MCV = 81.8. Message left confirming receipt of her message requesting return call to communicate how she is feelings to report any signs or symptoms she is living with at this time.  Awaiting return call or message from patient.  Last [facility] visit 11-15-2015 with A.P.P request for annual F/U ~ 11-14-2016 with Dr.Magrinat.

## 2016-08-18 ENCOUNTER — Telehealth: Payer: Self-pay | Admitting: *Deleted

## 2016-08-18 NOTE — Telephone Encounter (Signed)
This RN returned call to pt per her VM- she is inquiring about her iron levels and need to schedule for iron infusion.  Per lab review - IV iron indicated per MD recommendation.  Pt aware - request for infusions to be made on Fridays.  Inbox message sent to scheduling per above as well as follow up lab 6 weeks post infusion.  Note to MD for orders to be entered.

## 2016-08-21 ENCOUNTER — Other Ambulatory Visit: Payer: Self-pay | Admitting: Oncology

## 2016-08-22 ENCOUNTER — Telehealth: Payer: Self-pay | Admitting: Oncology

## 2016-08-22 NOTE — Telephone Encounter (Signed)
lvm for pt to call office back re infusionappts 8/31 and 9/7 per sch msg. Letter mailed 8/21 to patient

## 2016-09-01 ENCOUNTER — Ambulatory Visit (HOSPITAL_BASED_OUTPATIENT_CLINIC_OR_DEPARTMENT_OTHER): Payer: BLUE CROSS/BLUE SHIELD

## 2016-09-01 VITALS — BP 138/64 | HR 74 | Temp 98.9°F | Resp 17

## 2016-09-01 DIAGNOSIS — D509 Iron deficiency anemia, unspecified: Secondary | ICD-10-CM

## 2016-09-01 DIAGNOSIS — D500 Iron deficiency anemia secondary to blood loss (chronic): Secondary | ICD-10-CM

## 2016-09-01 MED ORDER — SODIUM CHLORIDE 0.9 % IV SOLN
510.0000 mg | Freq: Once | INTRAVENOUS | Status: AC
  Administered 2016-09-01: 510 mg via INTRAVENOUS
  Filled 2016-09-01: qty 17

## 2016-09-01 NOTE — Patient Instructions (Signed)

## 2016-09-02 ENCOUNTER — Other Ambulatory Visit: Payer: Self-pay | Admitting: Nurse Practitioner

## 2016-09-08 ENCOUNTER — Ambulatory Visit (HOSPITAL_BASED_OUTPATIENT_CLINIC_OR_DEPARTMENT_OTHER): Payer: BLUE CROSS/BLUE SHIELD

## 2016-09-08 VITALS — BP 141/87 | HR 80 | Temp 99.1°F | Resp 18

## 2016-09-08 DIAGNOSIS — D509 Iron deficiency anemia, unspecified: Secondary | ICD-10-CM | POA: Diagnosis not present

## 2016-09-08 DIAGNOSIS — D500 Iron deficiency anemia secondary to blood loss (chronic): Secondary | ICD-10-CM

## 2016-09-08 MED ORDER — SODIUM CHLORIDE 0.9 % IV SOLN
INTRAVENOUS | Status: DC
  Administered 2016-09-08: 16:00:00 via INTRAVENOUS

## 2016-09-08 MED ORDER — SODIUM CHLORIDE 0.9 % IV SOLN
510.0000 mg | Freq: Once | INTRAVENOUS | Status: AC
  Administered 2016-09-08: 510 mg via INTRAVENOUS
  Filled 2016-09-08: qty 17

## 2016-09-08 NOTE — Patient Instructions (Signed)

## 2016-10-31 ENCOUNTER — Other Ambulatory Visit: Payer: BLUE CROSS/BLUE SHIELD

## 2016-11-08 NOTE — Progress Notes (Signed)
ID: Charlene [name]   DOB: [birth year]  MR#: [id number]  ENI#:[id number]  PCP: Darcus Austin, MD GYN: Aloha Gell SU:  OTHER MD:  CHIEF COMPLAINT: Iron deficiency anemia  CURRENT TREATMENT: Feraheme as needed   HISTORY OF PRESENT ILLNESS: From the earlier summary note:  The patient had a gastric bypass for weight control in 2002.  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  She became significantly anemic and was receiving intravenous iron under Dr. Rockney Ghee in [place], [place].  She has now moved to this area and would like to continue to receive the intravenous iron on an as needed basis.   INTERVAL HISTORY: Charlene [name] returns today for follow-up of her iron deficiency anemia, which is due to continuing menstruation and to iron malabsorption start disposed gastric bypass.  She reports having no side effects from her iron infusions.   REVIEW OF SYSTEMS: Charlene [name] reports doing well overall. For exercise, she occasionally walks. She sees her PCP, Dr. Darcus Austin every 6 months in July and January. She sees Dr. Pamala Hurry in December. She reports having headaches due to menstruation. Her periods of heavy for 3-4 days. She denies unusual headaches, visual changes, nausea, vomiting, or dizziness. There has been no unusual cough, phlegm production, or pleurisy. This been no change in bowel or bladder habits. She denies unexplained fatigue or unexplained weight loss, bleeding, rash, or fever. A detailed review of systems was otherwise stable.    PAST MEDICAL HISTORY: Past Medical History:  Diagnosis Date  . Hypertension   . Lactating mother 07/13/2010  . No pertinent past medical history     PAST SURGICAL HISTORY: Past Surgical History:  Procedure Laterality Date  . GASTRIC BYPASS  2000  . HERNIA REPAIR  2003  . VENTRAL HERNIA REPAIR  2004  Aside  from the gastric bypass, is only significant for herniorrhaphy and tonsillectomy and adenoidectomy.   FAMILY HISTORY Family History  Problem Relation Age of Onset  . Hypertension Mother   . Heart disease Father   . Hypertension Father   The patient's parents are living.  The patient has 3 brothers and 1 sister.  The sister had a stroke at age 35 in the setting of drug abuse.  Otherwise, the family is doing well.  There is no history of bleeding or clotting problems in the family.   GYNECOLOGIC HISTORY: She is G2, F1, P0, A1, L1.  Periods are regular, last about a week, and are heavy  SOCIAL HISTORY: (Updated November 2018) She works for a Merchandiser, retail in [place].  Her husband, [name], works in Therapist, art.  She is currently going to school for a doctoral degree in business administration  to become a Pharmacist, hospital. Her son [name] is 62 and is in the [place]. Her daughter Melchor Amour is 6.  ADVANCED DIRECTIVES: not in place  HEALTH MAINTENANCE: Social History   Tobacco Use  . Smoking status: Never Smoker  Substance Use Topics  . Alcohol use: Yes    Comment: social drinker  . Drug use: No     Colonoscopy:  PAP:  Bone density:  Lipid panel:  Allergies  Allergen Reactions  . Penicillins Hives and Shortness Of Breath    Heart rate increases    Current Outpatient Medications  Medication Sig Dispense Refill  . hydrochlorothiazide (MICROZIDE) 12.5 MG capsule Take 12.5 mg by mouth daily.    Marland Kitchen lamoTRIgine (LAMICTAL) 150 MG tablet  2  . naltrexone (DEPADE) 50 MG tablet Take 50 mg daily by mouth.  3  . Vitamin D, Ergocalciferol, (DRISDOL) 50000 units CAPS capsule Take 1 capsule once a week by mouth.     Marland Kitchen VYVANSE 50 MG capsule Take 50 mg every morning by mouth.  0   No current facility-administered medications for this visit.     Vitals:   11/14/16 0920  BP: (!) 163/84  Pulse: 89  Resp: 20  Temp: 98.4 F (36.9 C)  SpO2: 98%     Body mass index is 52.09 kg/m.    ECOG  FS: 0  OBJECTIVE: Morbidly obese African-American woman who appears well  Sclerae unicteric, pupils round and equal Oropharynx clear and moist No cervical or supraclavicular adenopathy Lungs no rales or rhonchi Heart regular rate and rhythm Abd soft, nontender, positive bowel sounds MSK no focal spinal tenderness, no upper extremity lymphedema Neuro: nonfocal, well oriented, appropriate affect Breasts:      LAB RESULTS: Lab Results  Component Value Date   WBC 5.7 11/09/2016   NEUTROABS 3.2 11/09/2016   HGB 12.5 11/09/2016   HCT 37.1 11/09/2016   MCV 83.9 11/09/2016   PLT 214 11/09/2016      Chemistry      Component Value Date/Time   NA 139 11/09/2016 0834   K 3.9 11/09/2016 0834   CL 103 09/25/2012 1325   CO2 22 11/09/2016 0834   BUN 8.4 11/09/2016 0834   CREATININE 0.8 11/09/2016 0834      Component Value Date/Time   CALCIUM 8.7 11/09/2016 0834   ALKPHOS 69 11/09/2016 0834   AST 30 11/09/2016 0834   ALT 32 11/09/2016 0834   BILITOT 0.43 11/09/2016 0834      No results found for: LABCA2  No components found for: LABCA125  No results for input(s): INR in the last 168 hours.  Urinalysis    Component Value Date/Time   COLORURINE YELLOW 09/25/2012 1320   APPEARANCEUR CLEAR 09/25/2012 1320   LABSPEC 1.016 09/25/2012 1320   PHURINE 6.0 09/25/2012 1320   GLUCOSEU NEGATIVE 09/25/2012 1320   HGBUR NEGATIVE 09/25/2012 1320   BILIRUBINUR NEGATIVE 09/25/2012 1320   KETONESUR NEGATIVE 09/25/2012 1320   PROTEINUR NEGATIVE 09/25/2012 1320   UROBILINOGEN 0.2 09/25/2012 1320   NITRITE NEGATIVE 09/25/2012 1320   LEUKOCYTESUR NEGATIVE 09/25/2012 1320    STUDIES: Up-to-date on mammography   ASSESSMENT: 46 y.o. 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    (a) most recent Feraheme treatment 09/01/2016 and 09/08/2016  PLAN:  Charlene [name] tolerated her  most recent iron infusion without complications.  She likely will need further infusions in the future since she continues to have heavy periods and she cannot absorb iron status post bypass.  She already sees her primary care physician Dr. Inda Merlin in July and January.  We will check her lab work in March and September.  If and when her MCV starts dropping towards the 81 range she should alert [place] and we will set her up for further Feraheme infusions  Otherwise she will see me again next October.  She knows to call for any other issues that may develop before then.  Magrinat, Virgie Dad, MD  11/14/16 9:33 AM Medical Oncology and Hematology [facility] [address] [place],  [address] Tel. [phone]    Fax. [phone]  This document serves as a record of services personally performed  by Lurline Del, MD. It was created on his behalf by Sheron Nightingale, a trained medical scribe. The creation of this record is based on the scribe's personal observations and the provider's statements to them.   I have reviewed the above documentation for accuracy and completeness, and I agree with the above.

## 2016-11-09 ENCOUNTER — Other Ambulatory Visit (HOSPITAL_BASED_OUTPATIENT_CLINIC_OR_DEPARTMENT_OTHER): Payer: BLUE CROSS/BLUE SHIELD

## 2016-11-09 DIAGNOSIS — D509 Iron deficiency anemia, unspecified: Secondary | ICD-10-CM

## 2016-11-09 LAB — CBC WITH DIFFERENTIAL/PLATELET
BASO%: 1.2 % (ref 0.0–2.0)
Basophils Absolute: 0.1 10*3/uL (ref 0.0–0.1)
EOS%: 5.3 % (ref 0.0–7.0)
Eosinophils Absolute: 0.3 10*3/uL (ref 0.0–0.5)
HCT: 37.1 % (ref 34.8–46.6)
HGB: 12.5 g/dL (ref 11.6–15.9)
LYMPH%: 27.7 % (ref 14.0–49.7)
MCH: 28.2 pg (ref 25.1–34.0)
MCHC: 33.6 g/dL (ref 31.5–36.0)
MCV: 83.9 fL (ref 79.5–101.0)
MONO#: 0.6 10*3/uL (ref 0.1–0.9)
MONO%: 10.1 % (ref 0.0–14.0)
NEUT#: 3.2 10*3/uL (ref 1.5–6.5)
NEUT%: 55.7 % (ref 38.4–76.8)
Platelets: 214 10*3/uL (ref 145–400)
RBC: 4.43 10*6/uL (ref 3.70–5.45)
RDW: 18.9 % — ABNORMAL HIGH (ref 11.2–14.5)
WBC: 5.7 10*3/uL (ref 3.9–10.3)
lymph#: 1.6 10*3/uL (ref 0.9–3.3)

## 2016-11-09 LAB — COMPREHENSIVE METABOLIC PANEL
ALT: 32 U/L (ref 0–55)
AST: 30 U/L (ref 5–34)
Albumin: 3.8 g/dL (ref 3.5–5.0)
Alkaline Phosphatase: 69 U/L (ref 40–150)
Anion Gap: 9 mEq/L (ref 3–11)
BUN: 8.4 mg/dL (ref 7.0–26.0)
CO2: 22 mEq/L (ref 22–29)
Calcium: 8.7 mg/dL (ref 8.4–10.4)
Chloride: 108 mEq/L (ref 98–109)
Creatinine: 0.8 mg/dL (ref 0.6–1.1)
EGFR: >60 mL/min/{1.73_m2} (ref >60)
Glucose: 91 mg/dl (ref 70–140)
Potassium: 3.9 mEq/L (ref 3.5–5.1)
Sodium: 139 mEq/L (ref 136–145)
Total Bilirubin: 0.43 mg/dL (ref 0.20–1.20)
Total Protein: 7.4 g/dL (ref 6.4–8.3)

## 2016-11-09 LAB — FERRITIN: Ferritin: 37 ng/ml (ref 9–269)

## 2016-11-14 ENCOUNTER — Telehealth: Payer: Self-pay | Admitting: Oncology

## 2016-11-14 ENCOUNTER — Ambulatory Visit: Payer: BLUE CROSS/BLUE SHIELD | Admitting: Oncology

## 2016-11-14 VITALS — BP 163/84 | HR 89 | Temp 98.4°F | Resp 20 | Ht 70.0 in | Wt 363.0 lb

## 2016-11-14 DIAGNOSIS — Z9884 Bariatric surgery status: Secondary | ICD-10-CM

## 2016-11-14 DIAGNOSIS — K909 Intestinal malabsorption, unspecified: Secondary | ICD-10-CM

## 2016-11-14 DIAGNOSIS — D509 Iron deficiency anemia, unspecified: Secondary | ICD-10-CM | POA: Diagnosis not present

## 2016-11-14 DIAGNOSIS — D500 Iron deficiency anemia secondary to blood loss (chronic): Secondary | ICD-10-CM

## 2016-11-14 NOTE — Telephone Encounter (Signed)
Gave patient avs and calendar with appts per 11/13 los.

## 2017-02-08 ENCOUNTER — Telehealth: Payer: Self-pay

## 2017-02-08 NOTE — Telephone Encounter (Signed)
Returned pt call regarding FMLA paperwork. Informed pt she can bring her paperwork to our office and we will get them sent to our [place]. Informed her it takes 7-10 business days for forms to be completed and returned.  Cyndia Bent RN

## 2017-02-14 ENCOUNTER — Telehealth: Payer: Self-pay | Admitting: Oncology

## 2017-02-14 NOTE — Telephone Encounter (Signed)
02/14/17 @ 3:05 spoke with patient and confirmed FMLA paperwork was ready for pick up and will be located at the front desk reception area.

## 2017-03-13 ENCOUNTER — Other Ambulatory Visit: Payer: Self-pay | Admitting: *Deleted

## 2017-03-13 DIAGNOSIS — D500 Iron deficiency anemia secondary to blood loss (chronic): Secondary | ICD-10-CM

## 2017-03-14 ENCOUNTER — Inpatient Hospital Stay: Payer: BLUE CROSS/BLUE SHIELD | Attending: Oncology

## 2017-03-14 DIAGNOSIS — D500 Iron deficiency anemia secondary to blood loss (chronic): Secondary | ICD-10-CM | POA: Insufficient documentation

## 2017-03-14 LAB — CBC WITH DIFFERENTIAL (CANCER CENTER ONLY)
Basophils Absolute: 0.1 10*3/uL (ref 0.0–0.1)
Basophils Relative: 1 %
Eosinophils Absolute: 0.2 10*3/uL (ref 0.0–0.5)
Eosinophils Relative: 3 %
HCT: 35.7 % (ref 34.8–46.6)
Hemoglobin: 11.7 g/dL (ref 11.6–15.9)
Lymphocytes Relative: 25 %
Lymphs Abs: 1.7 10*3/uL (ref 0.9–3.3)
MCH: 25.1 pg (ref 25.1–34.0)
MCHC: 32.8 g/dL (ref 31.5–36.0)
MCV: 76.7 fL — ABNORMAL LOW (ref 79.5–101.0)
Monocytes Absolute: 0.7 10*3/uL (ref 0.1–0.9)
Monocytes Relative: 10 %
Neutro Abs: 4.2 10*3/uL (ref 1.5–6.5)
Neutrophils Relative %: 61 %
Platelet Count: 253 10*3/uL (ref 145–400)
RBC: 4.66 MIL/uL (ref 3.70–5.45)
RDW: 15.9 % — ABNORMAL HIGH (ref 11.2–14.5)
WBC Count: 6.8 10*3/uL (ref 3.9–10.3)

## 2017-03-14 LAB — CMP (CANCER CENTER ONLY)
ALT: 39 U/L (ref 0–55)
AST: 34 U/L (ref 5–34)
Albumin: 3.9 g/dL (ref 3.5–5.0)
Alkaline Phosphatase: 75 U/L (ref 40–150)
Anion gap: 10 (ref 3–11)
BUN: 13 mg/dL (ref 7–26)
CO2: 23 mmol/L (ref 22–29)
Calcium: 9.1 mg/dL (ref 8.4–10.4)
Chloride: 105 mmol/L (ref 98–109)
Creatinine: 0.85 mg/dL (ref 0.60–1.10)
GFR, Est AFR Am: >60 mL/min (ref >60)
GFR, Estimated: >60 mL/min (ref >60)
Glucose, Bld: 92 mg/dL (ref 70–140)
Potassium: 3.5 mmol/L (ref 3.5–5.1)
Sodium: 138 mmol/L (ref 136–145)
Total Bilirubin: 0.3 mg/dL (ref 0.2–1.2)
Total Protein: 7.9 g/dL (ref 6.4–8.3)

## 2017-03-14 LAB — FERRITIN: Ferritin: 12 ng/mL (ref 9–269)

## 2017-03-15 ENCOUNTER — Emergency Department (HOSPITAL_COMMUNITY)
Admission: EM | Admit: 2017-03-15 | Discharge: 2017-03-15 | Disposition: A | Discharge status: Home / Self Care | Payer: BLUE CROSS/BLUE SHIELD

## 2017-06-07 ENCOUNTER — Telehealth: Payer: Self-pay | Admitting: *Deleted

## 2017-06-07 NOTE — Telephone Encounter (Signed)
This RN returned call to pt per her VM stating she obtained labs at her primary MD " showing I need iron infusions "  Charlene [name] states her ferritin is 10.2 - she will bring a copy of her lab values to this office on day of infusion.  She is requesting next week - later in the day.  This RN sent an in box message to scheduling for appointments for iron infusion.  This note will be forwarded to the MD for communication and orders.

## 2017-06-08 ENCOUNTER — Other Ambulatory Visit: Payer: Self-pay | Admitting: Oncology

## 2017-06-08 ENCOUNTER — Telehealth: Payer: Self-pay | Admitting: Oncology

## 2017-06-08 NOTE — Telephone Encounter (Signed)
Scheduled appt per 6/7 sch message - pt is aware of appt date and time. Unable to schedule appts a wk apart due to conflict in pts schedule - scheduled next available.

## 2017-06-08 NOTE — Telephone Encounter (Signed)
Don't see dates yet for feraheme  Thaks  Gm

## 2017-06-12 ENCOUNTER — Other Ambulatory Visit: Payer: Self-pay | Admitting: Oncology

## 2017-06-12 NOTE — Telephone Encounter (Signed)
Scheduled for 6/13 and 6/25

## 2017-06-13 ENCOUNTER — Telehealth: Payer: Self-pay | Admitting: Oncology

## 2017-06-13 NOTE — Telephone Encounter (Signed)
Patient called to reschedule per MD it was ok

## 2017-06-14 ENCOUNTER — Ambulatory Visit: Payer: BLUE CROSS/BLUE SHIELD

## 2017-06-26 ENCOUNTER — Inpatient Hospital Stay: Payer: BLUE CROSS/BLUE SHIELD | Attending: Oncology

## 2017-06-26 VITALS — BP 130/64 | HR 86 | Temp 99.1°F | Resp 18

## 2017-06-26 DIAGNOSIS — D500 Iron deficiency anemia secondary to blood loss (chronic): Secondary | ICD-10-CM

## 2017-06-26 DIAGNOSIS — Z79899 Other long term (current) drug therapy: Secondary | ICD-10-CM | POA: Diagnosis not present

## 2017-06-26 MED ORDER — SODIUM CHLORIDE 0.9 % IV SOLN
510.0000 mg | Freq: Once | INTRAVENOUS | Status: AC
  Administered 2017-06-26: 510 mg via INTRAVENOUS
  Filled 2017-06-26: qty 17

## 2017-06-26 NOTE — Patient Instructions (Signed)

## 2017-07-10 ENCOUNTER — Inpatient Hospital Stay: Payer: BLUE CROSS/BLUE SHIELD

## 2017-07-11 ENCOUNTER — Telehealth: Payer: Self-pay | Admitting: Oncology

## 2017-07-11 NOTE — Telephone Encounter (Signed)
Called pt re appt that was added per 7/9 sch msg - left vm for pt re appts.

## 2017-07-18 ENCOUNTER — Inpatient Hospital Stay: Payer: BLUE CROSS/BLUE SHIELD | Attending: Oncology

## 2017-07-18 ENCOUNTER — Other Ambulatory Visit: Payer: Self-pay | Admitting: Oncology

## 2017-07-18 VITALS — BP 138/81 | HR 88 | Temp 99.1°F | Resp 18

## 2017-07-18 DIAGNOSIS — D500 Iron deficiency anemia secondary to blood loss (chronic): Secondary | ICD-10-CM | POA: Diagnosis not present

## 2017-07-18 DIAGNOSIS — Z79899 Other long term (current) drug therapy: Secondary | ICD-10-CM | POA: Diagnosis not present

## 2017-07-18 MED ORDER — SODIUM CHLORIDE 0.9 % IV SOLN
510.0000 mg | Freq: Once | INTRAVENOUS | Status: AC
  Administered 2017-07-18: 510 mg via INTRAVENOUS
  Filled 2017-07-18: qty 17

## 2017-07-18 NOTE — Patient Instructions (Signed)

## 2017-09-18 ENCOUNTER — Other Ambulatory Visit: Payer: Self-pay | Admitting: *Deleted

## 2017-09-18 DIAGNOSIS — D500 Iron deficiency anemia secondary to blood loss (chronic): Secondary | ICD-10-CM

## 2017-09-19 ENCOUNTER — Inpatient Hospital Stay: Payer: BLUE CROSS/BLUE SHIELD | Attending: Oncology

## 2017-10-05 NOTE — Progress Notes (Signed)
ID: Charlene [name]   DOB: 08-16-1970  MR#: [id number]  PXT#:[id number]  PCP: Darcus Austin, MD GYN: Aloha Gell SU:  OTHER MD:  CHIEF COMPLAINT: Iron deficiency anemia  CURRENT TREATMENT: Feraheme as needed   HISTORY OF PRESENT ILLNESS: From the earlier summary note:  The patient had a gastric bypass for weight control in 2002.  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  She became significantly anemic and was receiving intravenous iron under Dr. Rockney Ghee in [place], [place].  She has now moved to this area and would like to continue to receive the intravenous iron on an as needed basis.   INTERVAL HISTORY: [name] returns today for follow-up of her iron deficiency anemia, which is due to continuing menstruation and to iron malabsorption due to being s/p gastric bypass.  Her most recent feraheme infusion was on 07/18/2017. She tolerated this well without any complications.  We are checking repeat iron studies today  She notes that her menstrual cycles are occurring every 2-3 weeks, They last about 5 days with 2 days being heavy bleeding. She is following up with Dr. Pamala Hurry.    REVIEW OF SYSTEMS: Bevelyn reports that she is in school working on her doctorates in [place] administration. 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 She is feeling stressed between work and school. She denies unusual headaches, visual changes, nausea, vomiting, or dizziness. There has been no unusual cough, phlegm production, or pleurisy. There has been no change in bowel or bladder habits. She denies unexplained fatigue or unexplained weight loss, bleeding, rash, or fever. A detailed review of systems was otherwise stable.    PAST MEDICAL HISTORY: Past Medical History:  Diagnosis Date  . Hypertension   . Lactating mother 07/13/2010  . No pertinent past  medical history     PAST SURGICAL HISTORY: Past Surgical History:  Procedure Laterality Date  . GASTRIC BYPASS  2000  . HERNIA REPAIR  2003  . VENTRAL HERNIA REPAIR  2004  Aside from the gastric bypass, is only significant for herniorrhaphy and tonsillectomy and adenoidectomy.   FAMILY HISTORY Family History  Problem Relation Age of Onset  . Hypertension Mother   . Heart disease Father   . Hypertension Father   The patient's parents are living.  The patient has 3 brothers and 1 sister.  The sister had a stroke at age 41 in the setting of drug abuse.  Otherwise, the family is doing well.  There is no history of bleeding or clotting problems in the family.   GYNECOLOGIC HISTORY: She is G2, F1, P0, A1, L1.  Periods are regular, and are heavy  SOCIAL HISTORY: (Updated 10/08/2017) She works for [employer] in [place].  Her husband, Charlene [name], works in Therapist, art.  She is currently going to school for a doctoral degree in business administration. Her son [name] is 16 and Facilities manager at [employer]. Her daughter [name] is 7.  ADVANCED DIRECTIVES: not in place  HEALTH MAINTENANCE: Social History   Tobacco Use  . Smoking status: Never Smoker  Substance Use Topics  . Alcohol use: Yes    Comment: social drinker  . Drug use: No     Colonoscopy:  PAP:  Bone density:  Lipid panel:  Allergies  Allergen Reactions  . Penicillins Hives and Shortness Of Breath    Heart rate increases    Current Outpatient Medications  Medication Sig Dispense Refill  .  hydrochlorothiazide (MICROZIDE) 12.5 MG capsule Take 12.5 mg by mouth daily.    Marland Kitchen lamoTRIgine (LAMICTAL) 150 MG tablet   2  . naltrexone (DEPADE) 50 MG tablet Take 50 mg daily by mouth.  3  . Vitamin D, Ergocalciferol, (DRISDOL) 50000 units CAPS capsule Take 1 capsule once a week by mouth.     Marland Kitchen VYVANSE 50 MG capsule Take 50 mg every morning by mouth.  0   No current facility-administered medications for this  visit.     Vitals:   10/08/17 0900  BP: (!) 163/101  Pulse: 76  Resp: 18  Temp: 99.4 F (37.4 C)  SpO2: 100%     Body mass index is 48.25 kg/m.    ECOG FS: 0  OBJECTIVE: Morbidly obese African-American woman in no acute distress   Sclerae unicteric, EOMs intact Oropharynx clear and moist No cervical or supraclavicular adenopathy Lungs no rales or rhonchi Heart regular rate and rhythm Abd soft, nontender, positive bowel sounds MSK no focal spinal tenderness, no upper extremity lymphedema Neuro: nonfocal, well oriented, appropriate affect Breasts: No masses palpated.  Both axillae are benign.   LAB RESULTS: Lab Results  Component Value Date   WBC 4.4 10/08/2017   NEUTROABS 2.4 10/08/2017   HGB 11.9 10/08/2017   HCT 35.4 10/08/2017   MCV 80.4 10/08/2017   PLT 238 10/08/2017      Chemistry      Component Value Date/Time   NA 138 03/14/2017 0814   NA 139 11/09/2016 0834   K 3.5 03/14/2017 0814   K 3.9 11/09/2016 0834   CL 105 03/14/2017 0814   CO2 23 03/14/2017 0814   CO2 22 11/09/2016 0834   BUN 13 03/14/2017 0814   BUN 8.4 11/09/2016 0834   CREATININE 0.85 03/14/2017 0814   CREATININE 0.8 11/09/2016 0834      Component Value Date/Time   CALCIUM 9.1 03/14/2017 0814   CALCIUM 8.7 11/09/2016 0834   ALKPHOS 75 03/14/2017 0814   ALKPHOS 69 11/09/2016 0834   AST 34 03/14/2017 0814   AST 30 11/09/2016 0834   ALT 39 03/14/2017 0814   ALT 32 11/09/2016 0834   BILITOT 0.3 03/14/2017 0814   BILITOT 0.43 11/09/2016 0834      No results found for: LABCA2  No components found for: [id number]  No results for input(s): INR in the last 168 hours.  Urinalysis    Component Value Date/Time   COLORURINE YELLOW 09/25/2012 1320   APPEARANCEUR CLEAR 09/25/2012 1320   LABSPEC 1.016 09/25/2012 1320   PHURINE 6.0 09/25/2012 1320   GLUCOSEU NEGATIVE 09/25/2012 1320   HGBUR NEGATIVE 09/25/2012 1320   BILIRUBINUR NEGATIVE 09/25/2012 1320   KETONESUR NEGATIVE  09/25/2012 1320   PROTEINUR NEGATIVE 09/25/2012 1320   UROBILINOGEN 0.2 09/25/2012 1320   NITRITE NEGATIVE 09/25/2012 1320   LEUKOCYTESUR NEGATIVE 09/25/2012 1320    STUDIES: No results found.   ASSESSMENT: 47 y.o. 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    (a) Feraheme treatment 09/01/2016 and 09/08/2016  (b) additional Feraheme 06/26/2017 on 07/18/2017.  PLAN:  Savhanna did fine with her Feraheme infusions and her hemoglobin remains in the normal range.  She is having very frequent and heavy periods.  There are certainly ways of stopping these and she will discuss them with Dr. Valentino Saxon her gynecologist.  Jazzman is hypertensive.  She is on hydrochlorothiazide.  She was not able to tolerate  lisinopril.  Perhaps losartan or similar drugs should be added.  She will discuss this with Dr. Inda Merlin when she sees her in about a month  Otherwise we are going to follow her labs every 3 months.  If there is evidence of recurrent iron deficiency she will be set up again for Feraheme.  She will see me again in a year.  She knows to call if any other issues that may develop before then.  Maze Corniel, Virgie Dad, MD  10/08/17 9:29 AM Medical Oncology and Hematology [facility] [address] [place], [place] [address] Tel. [phone]    Fax. [phone]  Alice Rieger, am acting as scribe for Chauncey Cruel MD.  I, Lurline Del MD, have reviewed the above documentation for accuracy and completeness, and I agree with the above.

## 2017-10-08 ENCOUNTER — Inpatient Hospital Stay (HOSPITAL_BASED_OUTPATIENT_CLINIC_OR_DEPARTMENT_OTHER): Payer: BLUE CROSS/BLUE SHIELD | Admitting: Oncology

## 2017-10-08 ENCOUNTER — Inpatient Hospital Stay: Payer: BLUE CROSS/BLUE SHIELD | Attending: Oncology

## 2017-10-08 ENCOUNTER — Telehealth: Payer: Self-pay | Admitting: Oncology

## 2017-10-08 DIAGNOSIS — N920 Excessive and frequent menstruation with regular cycle: Secondary | ICD-10-CM | POA: Insufficient documentation

## 2017-10-08 DIAGNOSIS — I10 Essential (primary) hypertension: Secondary | ICD-10-CM | POA: Diagnosis not present

## 2017-10-08 DIAGNOSIS — Z79899 Other long term (current) drug therapy: Secondary | ICD-10-CM | POA: Insufficient documentation

## 2017-10-08 DIAGNOSIS — D500 Iron deficiency anemia secondary to blood loss (chronic): Secondary | ICD-10-CM

## 2017-10-08 DIAGNOSIS — D508 Other iron deficiency anemias: Secondary | ICD-10-CM

## 2017-10-08 DIAGNOSIS — D509 Iron deficiency anemia, unspecified: Secondary | ICD-10-CM | POA: Insufficient documentation

## 2017-10-08 DIAGNOSIS — K909 Intestinal malabsorption, unspecified: Secondary | ICD-10-CM | POA: Insufficient documentation

## 2017-10-08 DIAGNOSIS — Z9884 Bariatric surgery status: Secondary | ICD-10-CM | POA: Diagnosis not present

## 2017-10-08 LAB — CBC WITH DIFFERENTIAL (CANCER CENTER ONLY)
Basophils Absolute: 0 10*3/uL (ref 0.0–0.1)
Basophils Relative: 1 %
Eosinophils Absolute: 0.1 10*3/uL (ref 0.0–0.5)
Eosinophils Relative: 3 %
HCT: 35.4 % (ref 34.8–46.6)
Hemoglobin: 11.9 g/dL (ref 11.6–15.9)
Lymphocytes Relative: 29 %
Lymphs Abs: 1.3 10*3/uL (ref 0.9–3.3)
MCH: 26.9 pg (ref 25.1–34.0)
MCHC: 33.5 g/dL (ref 31.5–36.0)
MCV: 80.4 fL (ref 79.5–101.0)
Monocytes Absolute: 0.6 10*3/uL (ref 0.1–0.9)
Monocytes Relative: 13 %
Neutro Abs: 2.4 10*3/uL (ref 1.5–6.5)
Neutrophils Relative %: 54 %
Platelet Count: 238 10*3/uL (ref 145–400)
RBC: 4.4 MIL/uL (ref 3.70–5.45)
RDW: 16.6 % — ABNORMAL HIGH (ref 11.2–14.5)
WBC Count: 4.4 10*3/uL (ref 3.9–10.3)

## 2017-10-08 LAB — CMP (CANCER CENTER ONLY)
ALT: 21 U/L (ref 0–44)
AST: 25 U/L (ref 15–41)
Albumin: 3.8 g/dL (ref 3.5–5.0)
Alkaline Phosphatase: 66 U/L (ref 38–126)
Anion gap: 8 (ref 5–15)
BUN: 12 mg/dL (ref 6–20)
CO2: 26 mmol/L (ref 22–32)
Calcium: 8.9 mg/dL (ref 8.9–10.3)
Chloride: 105 mmol/L (ref 98–111)
Creatinine: 0.78 mg/dL (ref 0.44–1.00)
GFR, Est AFR Am: >60 mL/min (ref >60)
GFR, Estimated: >60 mL/min (ref >60)
Glucose, Bld: 98 mg/dL (ref 70–99)
Potassium: 3.6 mmol/L (ref 3.5–5.1)
Sodium: 139 mmol/L (ref 135–145)
Total Bilirubin: 0.4 mg/dL (ref 0.3–1.2)
Total Protein: 7.6 g/dL (ref 6.5–8.1)

## 2017-10-08 LAB — FERRITIN: Ferritin: 5 ng/mL — ABNORMAL LOW (ref 11–307)

## 2017-10-08 NOTE — Telephone Encounter (Signed)
Gave pt avs and calendar  °

## 2017-10-09 ENCOUNTER — Encounter: Payer: Self-pay | Admitting: Oncology

## 2017-10-10 ENCOUNTER — Telehealth: Payer: Self-pay | Admitting: Oncology

## 2017-10-10 ENCOUNTER — Telehealth: Payer: Self-pay

## 2017-10-10 NOTE — Telephone Encounter (Signed)
Called regarding 10/14 and 10/21

## 2017-10-10 NOTE — Telephone Encounter (Signed)
Per Dr Jana Hakim msg sent to schedulers to schedule pt for feraheme infuson x 2 due to recent ferritin level.

## 2017-10-15 ENCOUNTER — Other Ambulatory Visit: Payer: Self-pay | Admitting: Oncology

## 2017-10-15 ENCOUNTER — Inpatient Hospital Stay: Payer: BLUE CROSS/BLUE SHIELD

## 2017-10-15 VITALS — BP 140/80 | HR 69 | Temp 98.0°F | Resp 16

## 2017-10-15 DIAGNOSIS — D500 Iron deficiency anemia secondary to blood loss (chronic): Secondary | ICD-10-CM | POA: Diagnosis not present

## 2017-10-15 MED ORDER — SODIUM CHLORIDE 0.9 % IV SOLN
INTRAVENOUS | Status: DC
  Administered 2017-10-15: 12:00:00 via INTRAVENOUS
  Filled 2017-10-15: qty 250

## 2017-10-15 MED ORDER — SODIUM CHLORIDE 0.9 % IV SOLN
510.0000 mg | Freq: Once | INTRAVENOUS | Status: AC
  Administered 2017-10-15: 510 mg via INTRAVENOUS
  Filled 2017-10-15: qty 17

## 2017-10-15 NOTE — Patient Instructions (Signed)

## 2017-10-22 ENCOUNTER — Inpatient Hospital Stay: Payer: BLUE CROSS/BLUE SHIELD

## 2017-10-22 VITALS — BP 128/70 | HR 71 | Temp 97.9°F | Resp 18

## 2017-10-22 DIAGNOSIS — D500 Iron deficiency anemia secondary to blood loss (chronic): Secondary | ICD-10-CM

## 2017-10-22 MED ORDER — SODIUM CHLORIDE 0.9 % IV SOLN
510.0000 mg | Freq: Once | INTRAVENOUS | Status: AC
  Administered 2017-10-22: 510 mg via INTRAVENOUS
  Filled 2017-10-22: qty 17

## 2017-10-22 MED ORDER — SODIUM CHLORIDE 0.9 % IV SOLN
INTRAVENOUS | Status: DC
  Administered 2017-10-22: 16:00:00 via INTRAVENOUS
  Filled 2017-10-22: qty 250

## 2017-10-22 NOTE — Patient Instructions (Signed)

## 2018-01-07 ENCOUNTER — Inpatient Hospital Stay: Payer: BLUE CROSS/BLUE SHIELD | Attending: Oncology

## 2018-01-07 DIAGNOSIS — D500 Iron deficiency anemia secondary to blood loss (chronic): Secondary | ICD-10-CM | POA: Insufficient documentation

## 2018-01-07 DIAGNOSIS — D508 Other iron deficiency anemias: Secondary | ICD-10-CM

## 2018-01-07 LAB — CBC WITH DIFFERENTIAL/PLATELET
Abs Immature Granulocytes: 0.02 10*3/uL (ref 0.00–0.07)
Basophils Absolute: 0.1 10*3/uL (ref 0.0–0.1)
Basophils Relative: 1 %
Eosinophils Absolute: 0.2 10*3/uL (ref 0.0–0.5)
Eosinophils Relative: 3 %
HCT: 39.8 % (ref 36.0–46.0)
Hemoglobin: 13.3 g/dL (ref 12.0–15.0)
Immature Granulocytes: 0 %
Lymphocytes Relative: 29 %
Lymphs Abs: 1.5 10*3/uL (ref 0.7–4.0)
MCH: 29.1 pg (ref 26.0–34.0)
MCHC: 33.4 g/dL (ref 30.0–36.0)
MCV: 87.1 fL (ref 80.0–100.0)
Monocytes Absolute: 0.6 10*3/uL (ref 0.1–1.0)
Monocytes Relative: 11 %
Neutro Abs: 2.9 10*3/uL (ref 1.7–7.7)
Neutrophils Relative %: 56 %
Platelets: 220 10*3/uL (ref 150–400)
RBC: 4.57 MIL/uL (ref 3.87–5.11)
RDW: 15.8 % — ABNORMAL HIGH (ref 11.5–15.5)
WBC: 5.3 10*3/uL (ref 4.0–10.5)
nRBC: 0 % (ref 0.0–0.2)

## 2018-01-07 LAB — RETICULOCYTES
Immature Retic Fract: 13.3 % (ref 2.3–15.9)
RBC.: 4.57 MIL/uL (ref 3.87–5.11)
Retic Count, Absolute: 111.5 10*3/uL (ref 19.0–186.0)
Retic Ct Pct: 2.4 % (ref 0.4–3.1)

## 2018-01-07 LAB — IRON AND TIBC
Iron: 116 ug/dL (ref 41–142)
Saturation Ratios: 28 % (ref 21–57)
TIBC: 411 ug/dL (ref 236–444)
UIBC: 295 ug/dL (ref 120–384)

## 2018-01-07 LAB — FERRITIN: Ferritin: 33 ng/mL (ref 11–307)

## 2018-04-08 ENCOUNTER — Other Ambulatory Visit: Payer: BLUE CROSS/BLUE SHIELD

## 2018-05-31 ENCOUNTER — Telehealth: Payer: Self-pay | Admitting: Oncology

## 2018-05-31 NOTE — Telephone Encounter (Signed)
Tried to reach regarding schedule °

## 2018-06-03 ENCOUNTER — Ambulatory Visit: Payer: BLUE CROSS/BLUE SHIELD

## 2018-06-10 ENCOUNTER — Ambulatory Visit: Payer: BLUE CROSS/BLUE SHIELD

## 2018-06-14 ENCOUNTER — Inpatient Hospital Stay: Payer: BC Managed Care – PPO | Attending: Oncology

## 2018-06-14 ENCOUNTER — Other Ambulatory Visit: Payer: Self-pay

## 2018-06-14 VITALS — BP 128/63 | HR 81 | Temp 98.9°F | Resp 17

## 2018-06-14 DIAGNOSIS — Z79899 Other long term (current) drug therapy: Secondary | ICD-10-CM | POA: Diagnosis not present

## 2018-06-14 DIAGNOSIS — D508 Other iron deficiency anemias: Secondary | ICD-10-CM

## 2018-06-14 DIAGNOSIS — D509 Iron deficiency anemia, unspecified: Secondary | ICD-10-CM | POA: Insufficient documentation

## 2018-06-14 MED ORDER — SODIUM CHLORIDE 0.9 % IV SOLN
INTRAVENOUS | Status: DC
  Administered 2018-06-14: 16:00:00 via INTRAVENOUS
  Filled 2018-06-14: qty 250

## 2018-06-14 MED ORDER — SODIUM CHLORIDE 0.9 % IV SOLN
510.0000 mg | Freq: Once | INTRAVENOUS | Status: AC
  Administered 2018-06-14: 510 mg via INTRAVENOUS
  Filled 2018-06-14: qty 17

## 2018-06-14 NOTE — Patient Instructions (Signed)
Ferumoxytol injection What is this medicine? FERUMOXYTOL is an iron complex. Iron is used to make healthy red blood cells, which carry oxygen and nutrients throughout the body. This medicine is used to treat iron deficiency anemia. This medicine may be used for other purposes; ask your health care provider or pharmacist if you have questions. COMMON BRAND NAME(S): Feraheme What should I tell my health care provider before I take this medicine? They need to know if you have any of these conditions: -anemia not caused by low iron levels -high levels of iron in the blood -magnetic resonance imaging (MRI) test scheduled -an unusual or allergic reaction to iron, other medicines, foods, dyes, or preservatives -pregnant or trying to get pregnant -breast-feeding How should I use this medicine? This medicine is for injection into a vein. It is given by a health care professional in a hospital or clinic setting. Talk to your pediatrician regarding the use of this medicine in children. Special care may be needed. Overdosage: If you think you have taken too much of this medicine contact a poison control center or emergency room at once. NOTE: This medicine is only for you. Do not share this medicine with others. What if I miss a dose? It is important not to miss your dose. Call your doctor or health care professional if you are unable to keep an appointment. What may interact with this medicine? This medicine may interact with the following medications: -other iron products This list may not describe all possible interactions. Give your health care provider a list of all the medicines, herbs, non-prescription drugs, or dietary supplements you use. Also tell them if you smoke, drink alcohol, or use illegal drugs. Some items may interact with your medicine. What should I watch for while using this medicine? Visit your doctor or healthcare professional regularly. Tell your doctor or healthcare professional  if your symptoms do not start to get better or if they get worse. You may need blood work done while you are taking this medicine. You may need to follow a special diet. Talk to your doctor. Foods that contain iron include: whole grains/cereals, dried fruits, beans, or peas, leafy green vegetables, and organ meats (liver, kidney). What side effects may I notice from receiving this medicine? Side effects that you should report to your doctor or health care professional as soon as possible: -allergic reactions like skin rash, itching or hives, swelling of the face, lips, or tongue -breathing problems -changes in blood pressure -feeling faint or lightheaded, falls -fever or chills -flushing, sweating, or hot feelings -swelling of the ankles or feet Side effects that usually do not require medical attention (report to your doctor or health care professional if they continue or are bothersome): -diarrhea -headache -nausea, vomiting -stomach pain This list may not describe all possible side effects. Call your doctor for medical advice about side effects. You may report side effects to FDA at 1-800-FDA-1088. Where should I keep my medicine? This drug is given in a hospital or clinic and will not be stored at home. NOTE: This sheet is a summary. It may not cover all possible information. If you have questions about this medicine, talk to your doctor, pharmacist, or health care provider.  2019 Elsevier/Gold Standard (2016-02-07 20:21:10)  Coronavirus (COVID-19) Are you at risk?  Are you at risk for the Coronavirus (COVID-19)?  To be considered HIGH RISK for Coronavirus (COVID-19), you have to meet the following criteria:  . Traveled to [place], [place], [place], [place] or   [place]; or in the [place] to [place], [place], [place], or [place]; and have fever, cough, and shortness of breath within the last 2 weeks of travel OR . Been in close contact with a person diagnosed with COVID-19  within the last 2 weeks and have fever, cough, and shortness of breath . IF YOU DO NOT MEET THESE CRITERIA, YOU ARE CONSIDERED LOW RISK FOR COVID-19.  What to do if you are HIGH RISK for COVID-19?  . If you are having a medical emergency, call 911. . Seek medical care right away. Before you go to a doctor's office, urgent care or emergency department, call ahead and tell them about your recent travel, contact with someone diagnosed with COVID-19, and your symptoms. You should receive instructions from your physician's office regarding next steps of care.  . When you arrive at healthcare provider, tell the healthcare staff immediately you have returned from visiting [place], [place], [place], [place] or [place]; or traveled in the [place] to [place], [place], [place], or [place]; in the last two weeks or you have been in close contact with a person diagnosed with COVID-19 in the last 2 weeks.   . Tell the health care staff about your symptoms: fever, cough and shortness of breath. . After you have been seen by a medical provider, you will be either: o Tested for (COVID-19) and discharged home on quarantine except to seek medical care if symptoms worsen, and asked to  - Stay home and avoid contact with others until you get your results (4-5 days)  - Avoid travel on public transportation if possible (such as bus, train, or airplane) or o Sent to the Emergency Department by EMS for evaluation, COVID-19 testing, and possible admission depending on your condition and test results.  What to do if you are LOW RISK for COVID-19?  Reduce your risk of any infection by using the same precautions used for avoiding the common cold or flu:  . Wash your hands often with soap and warm water for at least 20 seconds.  If soap and water are not readily available, use an alcohol-based hand sanitizer with at least 60% alcohol.  . If coughing or sneezing, cover your mouth and nose by coughing or sneezing  into the elbow areas of your shirt or coat, into a tissue or into your sleeve (not your hands). . Avoid shaking hands with others and consider head nods or verbal greetings only. . Avoid touching your eyes, nose, or mouth with unwashed hands.  . Avoid close contact with people who are sick. . Avoid places or events with large numbers of people in one location, like concerts or sporting events. . Carefully consider travel plans you have or are making. . If you are planning any travel outside or inside the [place], visit the CDC's Travelers' Health webpage for the latest health notices. . If you have some symptoms but not all symptoms, continue to monitor at home and seek medical attention if your symptoms worsen. . If you are having a medical emergency, call 911.   ADDITIONAL HEALTHCARE OPTIONS FOR PATIENTS  [facility] / e-Visit: [email or web address]         [facility]: [phone]  [facility]: [phone]                   [facility]: [phone]   

## 2018-07-08 ENCOUNTER — Inpatient Hospital Stay: Payer: BC Managed Care – PPO | Attending: Oncology

## 2018-10-07 ENCOUNTER — Inpatient Hospital Stay: Payer: BC Managed Care – PPO | Attending: Oncology

## 2018-10-07 ENCOUNTER — Other Ambulatory Visit: Payer: Self-pay

## 2018-10-07 ENCOUNTER — Other Ambulatory Visit: Payer: Self-pay | Admitting: Oncology

## 2018-10-07 DIAGNOSIS — Z79899 Other long term (current) drug therapy: Secondary | ICD-10-CM | POA: Insufficient documentation

## 2018-10-07 DIAGNOSIS — D509 Iron deficiency anemia, unspecified: Secondary | ICD-10-CM | POA: Insufficient documentation

## 2018-10-07 DIAGNOSIS — D508 Other iron deficiency anemias: Secondary | ICD-10-CM

## 2018-10-07 DIAGNOSIS — Z9884 Bariatric surgery status: Secondary | ICD-10-CM | POA: Diagnosis not present

## 2018-10-07 LAB — CBC WITH DIFFERENTIAL/PLATELET
Abs Immature Granulocytes: 0.03 10*3/uL (ref 0.00–0.07)
Basophils Absolute: 0.1 10*3/uL (ref 0.0–0.1)
Basophils Relative: 1 %
Eosinophils Absolute: 0.4 10*3/uL (ref 0.0–0.5)
Eosinophils Relative: 6 %
HCT: 35.1 % — ABNORMAL LOW (ref 36.0–46.0)
Hemoglobin: 11.9 g/dL — ABNORMAL LOW (ref 12.0–15.0)
Immature Granulocytes: 1 %
Lymphocytes Relative: 27 %
Lymphs Abs: 1.8 10*3/uL (ref 0.7–4.0)
MCH: 27.3 pg (ref 26.0–34.0)
MCHC: 33.9 g/dL (ref 30.0–36.0)
MCV: 80.5 fL (ref 80.0–100.0)
Monocytes Absolute: 0.7 10*3/uL (ref 0.1–1.0)
Monocytes Relative: 10 %
Neutro Abs: 3.6 10*3/uL (ref 1.7–7.7)
Neutrophils Relative %: 55 %
Platelets: 282 10*3/uL (ref 150–400)
RBC: 4.36 MIL/uL (ref 3.87–5.11)
RDW: 13.6 % (ref 11.5–15.5)
WBC: 6.5 10*3/uL (ref 4.0–10.5)
nRBC: 0 % (ref 0.0–0.2)

## 2018-10-07 LAB — IRON AND TIBC
Iron: 48 ug/dL (ref 41–142)
Saturation Ratios: 12 % — ABNORMAL LOW (ref 21–57)
TIBC: 411 ug/dL (ref 236–444)
UIBC: 363 ug/dL (ref 120–384)

## 2018-10-07 LAB — RETICULOCYTES
Immature Retic Fract: 23 % — ABNORMAL HIGH (ref 2.3–15.9)
RBC.: 4.28 MIL/uL (ref 3.87–5.11)
Retic Count, Absolute: 83 10*3/uL (ref 19.0–186.0)
Retic Ct Pct: 1.9 % (ref 0.4–3.1)

## 2018-10-07 LAB — FERRITIN: Ferritin: 13 ng/mL (ref 11–307)

## 2018-10-13 NOTE — Progress Notes (Signed)
[place]  [phone] [phone]) [phone]   ID: Charlene [name]   DOB: 1970-08-26  MR#: [id number]  [id number]  PCP: Darcus Austin, MD (Inactive) GYN: Aloha Gell SU:  OTHER MD:  CHIEF COMPLAINT: Iron deficiency anemia  CURRENT TREATMENT: Feraheme as needed   HISTORY OF PRESENT ILLNESS: From the earlier summary note:  The patient had a gastric bypass for weight control in 2002.  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  She became significantly anemic and was receiving intravenous iron under Dr. Rockney Ghee in [place], [place].  She has now moved to this area and would like to continue to receive the intravenous iron on an as needed basis.    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 She was last seen here on 10/08/2017.   She continues on feraheme as needed, which was last received on 06/14/2018.  She tolerates this well, with no side effects that she is aware of.  Lab Results  Component Value Date   HGB 11.9 (L) 10/07/2018   HGB 13.3 01/07/2018   HGB 11.9 10/08/2017   HGB 11.7 03/14/2017   HGB 12.5 11/09/2016   HCT 35.1 (L) 10/07/2018   HCT 39.8 01/07/2018   HCT 35.4 10/08/2017   HCT 35.7 03/14/2017   HCT 37.1 11/09/2016   Lab Results  Component Value Date   TIBC 411 10/07/2018   TIBC 411 01/07/2018   FERRITIN 13 10/07/2018   FERRITIN 33 01/07/2018   FERRITIN 5 (L) 10/08/2017   FERRITIN 12 03/14/2017   FERRITIN 37 11/09/2016   IRONPCTSAT 12 (L) 10/07/2018   IRONPCTSAT 28 01/07/2018   Since her last visit here, she has not undergone any additional studies.     REVIEW OF SYSTEMS: Charlene [name] is currently working from home.  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  Her 87-year-old and 99-year-old are distance learning.  They try to take walks on most evenings for exercise.  She is still having heavy periods.  She is working with Dr. Valentino Saxon on this but she tells me she has already had 3 IUDs, 2 of which came out, and she was on what sounds like a progesterone shot and she says that made her bleed continually for 1 month.  For contraception currently she is using condoms.  A detailed review of systems today was otherwise stable   PAST MEDICAL HISTORY: Past Medical History:  Diagnosis Date  . Hypertension   . Lactating mother 07/13/2010  . No pertinent past medical history     PAST SURGICAL HISTORY: Past Surgical History:  Procedure Laterality Date  . GASTRIC BYPASS  2000  . HERNIA REPAIR  2003  . VENTRAL HERNIA REPAIR  2004  Aside from the gastric bypass, is only significant for herniorrhaphy and tonsillectomy and adenoidectomy.   FAMILY HISTORY Family History  Problem Relation Age of Onset  . Hypertension Mother   . Heart disease Father   . Hypertension Father   The patient's parents are living.  The patient has 3 brothers and 1 sister.  The sister had a stroke at age 67 in the setting of drug abuse.  Otherwise, the family is doing well.  There is no history of bleeding or clotting problems in the family.   GYNECOLOGIC HISTORY:  She is G2, F1, P0, A1, L1.  Periods are regular, and are heavy  SOCIAL HISTORY: (Updated 10/08/2017) She works for [employer] in [place].  Her husband, [name], works in Therapist, art.  She is currently going to school for a doctoral degree in business administration. Her son [name] is 8 and Facilities manager at [employer]. Her daughter [name] is 7.  ADVANCED DIRECTIVES: not in place  HEALTH MAINTENANCE: Social History   Tobacco Use  . Smoking status: Never Smoker  Substance Use Topics  . Alcohol use: Yes    Comment: social drinker  . Drug use: No     Colonoscopy:   PAP:  Bone density:  Lipid panel:  Allergies  Allergen Reactions  . Penicillins Hives and Shortness Of Breath    Heart rate increases    Current Outpatient Medications  Medication Sig Dispense Refill  . hydrochlorothiazide (MICROZIDE) 12.5 MG capsule Take 12.5 mg by mouth daily.    Marland Kitchen lamoTRIgine (LAMICTAL) 150 MG tablet   2  . naltrexone (DEPADE) 50 MG tablet Take 50 mg daily by mouth.  3  . Vitamin D, Ergocalciferol, (DRISDOL) 50000 units CAPS capsule Take 1 capsule once a week by mouth.     Marland Kitchen VYVANSE 50 MG capsule Take 50 mg every morning by mouth.  0   No current facility-administered medications for this visit.     OBJECTIVE: Morbidly obese African-American woman who appears stated age  Vitals:   10/14/18 0915  BP: 133/89  Pulse: 99  Resp: 20  Temp: 98.8 F (37.1 C)  SpO2: 100%   Wt Readings from Last 3 Encounters:  10/14/18 (!) 320 lb 11.2 oz (145.5 kg)  10/08/17 (!) 336 lb 4.8 oz (152.5 kg)  11/14/16 (!) 363 lb (164.7 kg)   Body mass index is 46.02 kg/m.    ECOG FS:1 - Symptomatic but completely ambulatory  Ocular: Sclerae unicteric, pupils round and equal Ear-nose-throat: Wearing a mask Lymphatic: No cervical or supraclavicular adenopathy Lungs no rales or rhonchi Heart regular rate and rhythm Abd soft, nontender, positive bowel sounds MSK no focal spinal tenderness, no joint edema Neuro: non-focal, well-oriented, appropriate affect Breasts: Deferred    LAB RESULTS: Lab Results  Component Value Date   WBC 6.5 10/07/2018   NEUTROABS 3.6 10/07/2018   HGB 11.9 (L) 10/07/2018   HCT 35.1 (L) 10/07/2018   MCV 80.5 10/07/2018   PLT 282 10/07/2018      Chemistry      Component Value Date/Time   NA 139 10/08/2017 0838   NA 139 11/09/2016 0834   K 3.6 10/08/2017 0838   K 3.9 11/09/2016 0834   CL 105 10/08/2017 0838   CO2 26 10/08/2017 0838   CO2 22 11/09/2016 0834   BUN 12 10/08/2017 0838   BUN 8.4 11/09/2016 0834   CREATININE 0.78 10/08/2017  0838   CREATININE 0.8 11/09/2016 0834      Component Value Date/Time   CALCIUM 8.9 10/08/2017 0838   CALCIUM 8.7 11/09/2016 0834   ALKPHOS 66 10/08/2017 0838   ALKPHOS 69 11/09/2016 0834   AST 25 10/08/2017 0838   AST 30 11/09/2016 0834   ALT 21 10/08/2017 0838   ALT 32 11/09/2016 0834   BILITOT 0.4 10/08/2017 0838   BILITOT 0.43 11/09/2016 0834      No results found for: LABCA2  No components found for: [id number]  No results for input(s): INR in the last 168 hours.  Urinalysis    Component Value Date/Time  COLORURINE YELLOW 09/25/2012 1320   APPEARANCEUR CLEAR 09/25/2012 1320   LABSPEC 1.016 09/25/2012 1320   PHURINE 6.0 09/25/2012 1320   GLUCOSEU NEGATIVE 09/25/2012 1320   HGBUR NEGATIVE 09/25/2012 1320   BILIRUBINUR NEGATIVE 09/25/2012 1320   KETONESUR NEGATIVE 09/25/2012 1320   PROTEINUR NEGATIVE 09/25/2012 1320   UROBILINOGEN 0.2 09/25/2012 1320   NITRITE NEGATIVE 09/25/2012 1320   LEUKOCYTESUR NEGATIVE 09/25/2012 1320    STUDIES: No results found.   ASSESSMENT: 48 y.o. 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    (a) Feraheme treatment 09/01/2016 and 09/08/2016  (b) additional Feraheme 06/26/2017 on 07/18/2017.  (c) Feraheme 10/15/2017 and 06/14/2018  (d) Feraheme 10/14/2018 and 10/21/2018  PLAN:  Nyilah is again iron deficient, although she is only minimally anemic.  We are going to go ahead and give her Feraheme this week and next, and then we will continue to follow her counts every 3 months.  She is likely to continue to need Feraheme at various intervals until she becomes fully menopausal.  We could do goserelin.  That would put her in temporary menopause.  There are quite a few side effects associated with that.  She would prefer the current plan.  She is considering what sounds like an ablation procedure under Dr. Valentino Saxon but all  that has been held because of the pandemic  Naiara knows to call for any other issue that may develop before the next visit.  Magrinat, Virgie Dad, MD  10/14/18 9:23 AM Medical Oncology and Hematology [facility] [place], [place] [address] Tel. [phone]    Fax. [phone]  I, Jacqualyn Posey am acting as a Education administrator for Chauncey Cruel, MD.   I, Lurline Del MD, have reviewed the above documentation for accuracy and completeness, and I agree with the above.

## 2018-10-14 ENCOUNTER — Inpatient Hospital Stay: Payer: BC Managed Care – PPO

## 2018-10-14 ENCOUNTER — Inpatient Hospital Stay: Payer: BC Managed Care – PPO | Admitting: Oncology

## 2018-10-14 ENCOUNTER — Other Ambulatory Visit: Payer: Self-pay

## 2018-10-14 VITALS — BP 125/83 | HR 80 | Temp 99.1°F | Resp 20

## 2018-10-14 VITALS — BP 133/89 | HR 99 | Temp 98.8°F | Resp 20 | Ht 70.0 in | Wt 320.7 lb

## 2018-10-14 DIAGNOSIS — D508 Other iron deficiency anemias: Secondary | ICD-10-CM | POA: Diagnosis not present

## 2018-10-14 DIAGNOSIS — Z9884 Bariatric surgery status: Secondary | ICD-10-CM | POA: Diagnosis not present

## 2018-10-14 DIAGNOSIS — D500 Iron deficiency anemia secondary to blood loss (chronic): Secondary | ICD-10-CM

## 2018-10-14 DIAGNOSIS — D509 Iron deficiency anemia, unspecified: Secondary | ICD-10-CM | POA: Diagnosis not present

## 2018-10-14 MED ORDER — SODIUM CHLORIDE 0.9 % IV SOLN
Freq: Once | INTRAVENOUS | Status: AC
  Administered 2018-10-14: 10:00:00 via INTRAVENOUS
  Filled 2018-10-14: qty 250

## 2018-10-14 MED ORDER — SODIUM CHLORIDE 0.9 % IV SOLN
510.0000 mg | Freq: Once | INTRAVENOUS | Status: AC
  Administered 2018-10-14: 510 mg via INTRAVENOUS
  Filled 2018-10-14: qty 510

## 2018-10-14 NOTE — Patient Instructions (Signed)
Ferumoxytol injection What is this medicine? FERUMOXYTOL is an iron complex. Iron is used to make healthy red blood cells, which carry oxygen and nutrients throughout the body. This medicine is used to treat iron deficiency anemia. This medicine may be used for other purposes; ask your health care provider or pharmacist if you have questions. COMMON BRAND NAME(S): Feraheme What should I tell my health care provider before I take this medicine? They need to know if you have any of these conditions: -anemia not caused by low iron levels -high levels of iron in the blood -magnetic resonance imaging (MRI) test scheduled -an unusual or allergic reaction to iron, other medicines, foods, dyes, or preservatives -pregnant or trying to get pregnant -breast-feeding How should I use this medicine? This medicine is for injection into a vein. It is given by a health care professional in a hospital or clinic setting. Talk to your pediatrician regarding the use of this medicine in children. Special care may be needed. Overdosage: If you think you have taken too much of this medicine contact a poison control center or emergency room at once. NOTE: This medicine is only for you. Do not share this medicine with others. What if I miss a dose? It is important not to miss your dose. Call your doctor or health care professional if you are unable to keep an appointment. What may interact with this medicine? This medicine may interact with the following medications: -other iron products This list may not describe all possible interactions. Give your health care provider a list of all the medicines, herbs, non-prescription drugs, or dietary supplements you use. Also tell them if you smoke, drink alcohol, or use illegal drugs. Some items may interact with your medicine. What should I watch for while using this medicine? Visit your doctor or healthcare professional regularly. Tell your doctor or healthcare professional  if your symptoms do not start to get better or if they get worse. You may need blood work done while you are taking this medicine. You may need to follow a special diet. Talk to your doctor. Foods that contain iron include: whole grains/cereals, dried fruits, beans, or peas, leafy green vegetables, and organ meats (liver, kidney). What side effects may I notice from receiving this medicine? Side effects that you should report to your doctor or health care professional as soon as possible: -allergic reactions like skin rash, itching or hives, swelling of the face, lips, or tongue -breathing problems -changes in blood pressure -feeling faint or lightheaded, falls -fever or chills -flushing, sweating, or hot feelings -swelling of the ankles or feet Side effects that usually do not require medical attention (report to your doctor or health care professional if they continue or are bothersome): -diarrhea -headache -nausea, vomiting -stomach pain This list may not describe all possible side effects. Call your doctor for medical advice about side effects. You may report side effects to FDA at 1-800-FDA-1088. Where should I keep my medicine? This drug is given in a hospital or clinic and will not be stored at home. NOTE: This sheet is a summary. It may not cover all possible information. If you have questions about this medicine, talk to your doctor, pharmacist, or health care provider.  2019 Elsevier/Gold Standard (2016-02-07 20:21:10)  Coronavirus (COVID-19) Are you at risk?  Are you at risk for the Coronavirus (COVID-19)?  To be considered HIGH RISK for Coronavirus (COVID-19), you have to meet the following criteria:  . Traveled to [place], [place], [place], [place] or   [place]; or in the [place] to [place], [place], [place], or [place]; and have fever, cough, and shortness of breath within the last 2 weeks of travel OR . Been in close contact with a person diagnosed with COVID-19  within the last 2 weeks and have fever, cough, and shortness of breath . IF YOU DO NOT MEET THESE CRITERIA, YOU ARE CONSIDERED LOW RISK FOR COVID-19.  What to do if you are HIGH RISK for COVID-19?  . If you are having a medical emergency, call 911. . Seek medical care right away. Before you go to a doctor's office, urgent care or emergency department, call ahead and tell them about your recent travel, contact with someone diagnosed with COVID-19, and your symptoms. You should receive instructions from your physician's office regarding next steps of care.  . When you arrive at healthcare provider, tell the healthcare staff immediately you have returned from visiting [place], [place], [place], [place] or [place]; or traveled in the [place] to [place], [place], [place], or [place]; in the last two weeks or you have been in close contact with a person diagnosed with COVID-19 in the last 2 weeks.   . Tell the health care staff about your symptoms: fever, cough and shortness of breath. . After you have been seen by a medical provider, you will be either: o Tested for (COVID-19) and discharged home on quarantine except to seek medical care if symptoms worsen, and asked to  - Stay home and avoid contact with others until you get your results (4-5 days)  - Avoid travel on public transportation if possible (such as bus, train, or airplane) or o Sent to the Emergency Department by EMS for evaluation, COVID-19 testing, and possible admission depending on your condition and test results.  What to do if you are LOW RISK for COVID-19?  Reduce your risk of any infection by using the same precautions used for avoiding the common cold or flu:  . Wash your hands often with soap and warm water for at least 20 seconds.  If soap and water are not readily available, use an alcohol-based hand sanitizer with at least 60% alcohol.  . If coughing or sneezing, cover your mouth and nose by coughing or sneezing  into the elbow areas of your shirt or coat, into a tissue or into your sleeve (not your hands). . Avoid shaking hands with others and consider head nods or verbal greetings only. . Avoid touching your eyes, nose, or mouth with unwashed hands.  . Avoid close contact with people who are sick. . Avoid places or events with large numbers of people in one location, like concerts or sporting events. . Carefully consider travel plans you have or are making. . If you are planning any travel outside or inside the [place], visit the CDC's Travelers' Health webpage for the latest health notices. . If you have some symptoms but not all symptoms, continue to monitor at home and seek medical attention if your symptoms worsen. . If you are having a medical emergency, call 911.   ADDITIONAL HEALTHCARE OPTIONS FOR PATIENTS  [facility] / e-Visit: [email or web address]         [facility]: [phone]   Urgent Care: [phone]                   [facility]: [phone]   

## 2018-10-15 ENCOUNTER — Telehealth: Payer: Self-pay | Admitting: Oncology

## 2018-10-15 NOTE — Telephone Encounter (Signed)
I talk with patient regarding schedule  

## 2018-10-21 ENCOUNTER — Inpatient Hospital Stay: Payer: BC Managed Care – PPO

## 2018-10-21 ENCOUNTER — Other Ambulatory Visit: Payer: Self-pay

## 2018-10-21 VITALS — BP 135/86 | HR 76 | Temp 97.3°F | Resp 20

## 2018-10-21 DIAGNOSIS — D500 Iron deficiency anemia secondary to blood loss (chronic): Secondary | ICD-10-CM

## 2018-10-21 DIAGNOSIS — D509 Iron deficiency anemia, unspecified: Secondary | ICD-10-CM | POA: Diagnosis not present

## 2018-10-21 MED ORDER — SODIUM CHLORIDE 0.9 % IV SOLN
510.0000 mg | Freq: Once | INTRAVENOUS | Status: AC
  Administered 2018-10-21: 510 mg via INTRAVENOUS
  Filled 2018-10-21: qty 510

## 2018-10-21 MED ORDER — SODIUM CHLORIDE 0.9 % IV SOLN
INTRAVENOUS | Status: DC
  Administered 2018-10-21: 10:00:00 via INTRAVENOUS
  Filled 2018-10-21 (×2): qty 250

## 2018-10-21 NOTE — Patient Instructions (Signed)

## 2018-11-05 ENCOUNTER — Other Ambulatory Visit: Payer: Self-pay

## 2018-11-05 DIAGNOSIS — Z20822 Contact with and (suspected) exposure to covid-19: Secondary | ICD-10-CM

## 2018-11-07 LAB — NOVEL CORONAVIRUS, NAA: SARS-CoV-2, NAA: NOT DETECTED

## 2019-01-14 ENCOUNTER — Other Ambulatory Visit: Payer: Self-pay

## 2019-01-14 ENCOUNTER — Inpatient Hospital Stay: Payer: BC Managed Care – PPO | Attending: Oncology

## 2019-01-14 DIAGNOSIS — Z79899 Other long term (current) drug therapy: Secondary | ICD-10-CM | POA: Diagnosis not present

## 2019-01-14 DIAGNOSIS — D508 Other iron deficiency anemias: Secondary | ICD-10-CM

## 2019-01-14 DIAGNOSIS — D509 Iron deficiency anemia, unspecified: Secondary | ICD-10-CM | POA: Diagnosis not present

## 2019-01-14 LAB — RETICULOCYTES
Immature Retic Fract: 19.3 % — ABNORMAL HIGH (ref 2.3–15.9)
RBC.: 4.48 MIL/uL (ref 3.87–5.11)
Retic Count, Absolute: 88.7 10*3/uL (ref 19.0–186.0)
Retic Ct Pct: 2 % (ref 0.4–3.1)

## 2019-01-14 LAB — FERRITIN: Ferritin: 19 ng/mL (ref 11–307)

## 2019-01-14 LAB — CBC WITH DIFFERENTIAL/PLATELET
Abs Immature Granulocytes: 0.03 10*3/uL (ref 0.00–0.07)
Basophils Absolute: 0.1 10*3/uL (ref 0.0–0.1)
Basophils Relative: 1 %
Eosinophils Absolute: 0.3 10*3/uL (ref 0.0–0.5)
Eosinophils Relative: 3 %
HCT: 38 % (ref 36.0–46.0)
Hemoglobin: 13 g/dL (ref 12.0–15.0)
Immature Granulocytes: 0 %
Lymphocytes Relative: 27 %
Lymphs Abs: 2.1 10*3/uL (ref 0.7–4.0)
MCH: 29 pg (ref 26.0–34.0)
MCHC: 34.2 g/dL (ref 30.0–36.0)
MCV: 84.6 fL (ref 80.0–100.0)
Monocytes Absolute: 0.9 10*3/uL (ref 0.1–1.0)
Monocytes Relative: 11 %
Neutro Abs: 4.6 10*3/uL (ref 1.7–7.7)
Neutrophils Relative %: 58 %
Platelets: 309 10*3/uL (ref 150–400)
RBC: 4.49 MIL/uL (ref 3.87–5.11)
RDW: 13.7 % (ref 11.5–15.5)
WBC: 7.8 10*3/uL (ref 4.0–10.5)
nRBC: 0 % (ref 0.0–0.2)

## 2019-01-14 LAB — IRON AND TIBC
Iron: 95 ug/dL (ref 41–142)
Saturation Ratios: 23 % (ref 21–57)
TIBC: 419 ug/dL (ref 236–444)
UIBC: 323 ug/dL (ref 120–384)

## 2019-04-14 ENCOUNTER — Telehealth: Payer: Self-pay | Admitting: Oncology

## 2019-04-14 ENCOUNTER — Other Ambulatory Visit: Payer: BC Managed Care – PPO

## 2019-04-14 NOTE — Telephone Encounter (Signed)
Called pt per 4/12 sch message - no answer - left message for pt to call back to reschedule appt  

## 2019-04-15 ENCOUNTER — Inpatient Hospital Stay: Payer: BC Managed Care – PPO | Attending: Oncology

## 2019-05-13 ENCOUNTER — Other Ambulatory Visit: Payer: Self-pay | Admitting: Oncology

## 2019-05-13 ENCOUNTER — Telehealth: Payer: Self-pay | Admitting: *Deleted

## 2019-05-13 NOTE — Telephone Encounter (Signed)
Notified that Dr Jana Hakim received labs from Dr Stephanie Acre. Dr Jana Hakim has placed orders for her to see him and received an iron infusion

## 2019-05-13 NOTE — Progress Notes (Signed)
We got some labs on Charlene [name] and they show as she is again iron deficient.  I am setting her up for 2 infusions and a visit later this month.

## 2019-05-15 ENCOUNTER — Telehealth: Payer: Self-pay | Admitting: Adult Health

## 2019-05-15 NOTE — Telephone Encounter (Signed)
scheduled appt per 5/11 sch message - unable to reach pt . Left message with appt date and time

## 2019-05-22 ENCOUNTER — Inpatient Hospital Stay: Payer: BC Managed Care – PPO

## 2019-05-22 ENCOUNTER — Other Ambulatory Visit: Payer: Self-pay

## 2019-05-22 ENCOUNTER — Inpatient Hospital Stay: Payer: BC Managed Care – PPO | Attending: Oncology

## 2019-05-22 ENCOUNTER — Inpatient Hospital Stay (HOSPITAL_BASED_OUTPATIENT_CLINIC_OR_DEPARTMENT_OTHER): Payer: BC Managed Care – PPO | Admitting: Adult Health

## 2019-05-22 ENCOUNTER — Encounter: Payer: Self-pay | Admitting: Adult Health

## 2019-05-22 VITALS — BP 133/90 | HR 81 | Resp 18

## 2019-05-22 VITALS — BP 141/87 | HR 78 | Temp 98.5°F | Resp 20 | Ht 70.0 in | Wt 306.4 lb

## 2019-05-22 DIAGNOSIS — D500 Iron deficiency anemia secondary to blood loss (chronic): Secondary | ICD-10-CM

## 2019-05-22 DIAGNOSIS — D508 Other iron deficiency anemias: Secondary | ICD-10-CM | POA: Diagnosis not present

## 2019-05-22 DIAGNOSIS — Z9884 Bariatric surgery status: Secondary | ICD-10-CM | POA: Diagnosis not present

## 2019-05-22 DIAGNOSIS — K912 Postsurgical malabsorption, not elsewhere classified: Secondary | ICD-10-CM | POA: Insufficient documentation

## 2019-05-22 LAB — CBC WITH DIFFERENTIAL/PLATELET
Abs Immature Granulocytes: 0.03 10*3/uL (ref 0.00–0.07)
Basophils Absolute: 0.1 10*3/uL (ref 0.0–0.1)
Basophils Relative: 1 %
Eosinophils Absolute: 0.2 10*3/uL (ref 0.0–0.5)
Eosinophils Relative: 3 %
HCT: 38.3 % (ref 36.0–46.0)
Hemoglobin: 12.8 g/dL (ref 12.0–15.0)
Immature Granulocytes: 0 %
Lymphocytes Relative: 25 %
Lymphs Abs: 1.8 10*3/uL (ref 0.7–4.0)
MCH: 25 pg — ABNORMAL LOW (ref 26.0–34.0)
MCHC: 33.4 g/dL (ref 30.0–36.0)
MCV: 74.8 fL — ABNORMAL LOW (ref 80.0–100.0)
Monocytes Absolute: 0.7 10*3/uL (ref 0.1–1.0)
Monocytes Relative: 9 %
Neutro Abs: 4.4 10*3/uL (ref 1.7–7.7)
Neutrophils Relative %: 62 %
Platelets: 320 10*3/uL (ref 150–400)
RBC: 5.12 MIL/uL — ABNORMAL HIGH (ref 3.87–5.11)
RDW: 17.1 % — ABNORMAL HIGH (ref 11.5–15.5)
WBC: 7.2 10*3/uL (ref 4.0–10.5)
nRBC: 0 % (ref 0.0–0.2)

## 2019-05-22 LAB — FERRITIN: Ferritin: 15 ng/mL (ref 11–307)

## 2019-05-22 LAB — RETICULOCYTES
Immature Retic Fract: 18.6 % — ABNORMAL HIGH (ref 2.3–15.9)
RBC.: 5.11 MIL/uL (ref 3.87–5.11)
Retic Count, Absolute: 103.2 10*3/uL (ref 19.0–186.0)
Retic Ct Pct: 2 % (ref 0.4–3.1)

## 2019-05-22 LAB — IRON AND TIBC
Iron: 31 ug/dL — ABNORMAL LOW (ref 41–142)
Saturation Ratios: 6 % — ABNORMAL LOW (ref 21–57)
TIBC: 513 ug/dL — ABNORMAL HIGH (ref 236–444)
UIBC: 482 ug/dL — ABNORMAL HIGH (ref 120–384)

## 2019-05-22 MED ORDER — SODIUM CHLORIDE 0.9 % IV SOLN
510.0000 mg | Freq: Once | INTRAVENOUS | Status: AC
  Administered 2019-05-22: 510 mg via INTRAVENOUS
  Filled 2019-05-22: qty 510

## 2019-05-22 MED ORDER — SODIUM CHLORIDE 0.9 % IV SOLN
INTRAVENOUS | Status: DC
  Filled 2019-05-22: qty 250

## 2019-05-22 NOTE — Patient Instructions (Signed)

## 2019-05-22 NOTE — Progress Notes (Signed)
[place]  Telephone:([phone] Fax:(336) [phone]   ID: Willaim [name]   DOB: 17-Aug-1970  MR#: [id number]  [id number]  PCP: Jonathon Jordan, MD GYN: Aloha Gell SU:  OTHER MD:  CHIEF COMPLAINT: Iron deficiency anemia  CURRENT TREATMENT: Feraheme as needed   HISTORY OF PRESENT ILLNESS: From the earlier summary note:  The patient had a gastric bypass for weight control in 2002.  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  She became significantly anemic and was receiving intravenous iron under Dr. Rockney Ghee in [place], [place].  She has now moved to this area and would like to continue to receive the intravenous iron on an as needed basis.    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 She was last seen here on 10/2018.    She is doing well.  She notes her cycles have not been as frequent, hwoever she has been spotting the past few weeks more consistently.  Her last f/u with gyn was completed earlier this year with a sonohystogram in February.  She does have fibroids and an increase in the endometrial lining.  Cabria was placed on Norethindrone.  She is taking this, and her cycles have improved.  The only issue she has with taking this is headaches.    REVIEW OF SYSTEMS: Sharleen is currently working from home.  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  Her 47 year old and 46-year-old are distance learning.  She is working on her Dispensing optician for business school.  She is losing weight and has been doing this with monitoring her diet and walking more.  She is down 14 pounds.  Otherwise, a detailed ROS was non contributory today.  PAST MEDICAL HISTORY: Past  Medical History:  Diagnosis Date  . Hypertension   . Lactating mother 07/13/2010  . No pertinent past medical history     PAST SURGICAL HISTORY: Past Surgical History:  Procedure Laterality Date  . GASTRIC BYPASS  2000  . HERNIA REPAIR  2003  . VENTRAL HERNIA REPAIR  2004  Aside from the gastric bypass, is only significant for herniorrhaphy and tonsillectomy and adenoidectomy.   FAMILY HISTORY Family History  Problem Relation Age of Onset  . Hypertension Mother   . Heart disease Father   . Hypertension Father   The patient's parents are living.  The patient has 3 brothers and 1 sister.  The sister had a stroke at age 40 in the setting of drug abuse.  Otherwise, the family is doing well.  There is no history of bleeding or clotting problems in the family.   GYNECOLOGIC HISTORY: She is G2, F1, P0, A1, L1.  Periods are regular, and are heavy  SOCIAL HISTORY: (Updated 10/09/19) She is working remotely for [employer] in [place].  She is separated from her husband [name].  She is currently going to school for a doctoral degree in business administration. Her son [name] is 57 and Facilities manager at [employer]. She has her niece [name] who is 53 living with her along with [name], her daughter who is 19 years old.    ADVANCED DIRECTIVES: not in place  HEALTH MAINTENANCE: Social History   Tobacco Use  . Smoking status: Never Smoker  Substance Use Topics  . Alcohol use: Yes  Comment: social drinker  . Drug use: No     Colonoscopy: 2016  PAP: 2020  Bone density: n/a  Lipid panel: 2021  Allergies  Allergen Reactions  . Penicillins Hives and Shortness Of Breath    Heart rate increases    Current Outpatient Medications  Medication Sig Dispense Refill  . amLODipine (NORVASC) 5 MG tablet Take 5 mg by mouth daily.    . cyanocobalamin (,VITAMIN B-12,) 1000 MCG/ML injection Inject 1,000 mcg into the muscle every 30 (thirty) days.    . hydrochlorothiazide (MICROZIDE) 12.5  MG capsule Take 12.5 mg by mouth daily.    Marland Kitchen lamoTRIgine (LAMICTAL) 150 MG tablet   2  . levothyroxine (SYNTHROID) 75 MCG tablet Take 75 mcg by mouth daily.    . naltrexone (DEPADE) 50 MG tablet Take 50 mg daily by mouth.  3  . norethindrone (AYGESTIN) 5 MG tablet Take by mouth.    . traZODone (DESYREL) 50 MG tablet Take 50-100 mg by mouth at bedtime as needed.    . Vitamin D, Ergocalciferol, (DRISDOL) 50000 units CAPS capsule Take 1 capsule once a week by mouth.      No current facility-administered medications for this visit.    OBJECTIVE:   Vitals:   05/22/19 0924  BP: (!) 141/87  Pulse: 78  Resp: 20  Temp: 98.5 F (36.9 C)  SpO2: 99%   Wt Readings from Last 3 Encounters:  05/22/19 (!) 306 lb 6.4 oz (139 kg)  10/14/18 (!) 320 lb 11.2 oz (145.5 kg)  10/08/17 (!) 336 lb 4.8 oz (152.5 kg)   Body mass index is 43.96 kg/m.    ECOG FS:1 - Symptomatic but completely ambulatory GENERAL: Patient is a well appearing female in no acute distress HEENT:  Sclerae anicteric.   Mask in place.  Neck is supple.  NODES:  No cervical, supraclavicular, or axillary lymphadenopathy palpated.  BREAST EXAM:  Deferred. LUNGS:  Clear to auscultation bilaterally.  No wheezes or rhonchi. HEART:  Regular rate and rhythm. No murmur appreciated. ABDOMEN:  Soft, nontender.  Positive, normoactive bowel sounds. No organomegaly palpated. MSK:  No focal spinal tenderness to palpation. Full range of motion bilaterally in the upper extremities. EXTREMITIES:  No peripheral edema.   SKIN:  Clear with no obvious rashes or skin changes. No nail dyscrasia. NEURO:  Nonfocal. Well oriented.  Appropriate affect.      LAB RESULTS: Lab Results  Component Value Date   WBC 7.2 05/22/2019   NEUTROABS 4.4 05/22/2019   HGB 12.8 05/22/2019   HCT 38.3 05/22/2019   MCV 74.8 (L) 05/22/2019   PLT 320 05/22/2019      Chemistry      Component Value Date/Time   NA 139 10/08/2017 0838   NA 139 11/09/2016 0834   K  3.6 10/08/2017 0838   K 3.9 11/09/2016 0834   CL 105 10/08/2017 0838   CO2 26 10/08/2017 0838   CO2 22 11/09/2016 0834   BUN 12 10/08/2017 0838   BUN 8.4 11/09/2016 0834   CREATININE 0.78 10/08/2017 0838   CREATININE 0.8 11/09/2016 0834      Component Value Date/Time   CALCIUM 8.9 10/08/2017 0838   CALCIUM 8.7 11/09/2016 0834   ALKPHOS 66 10/08/2017 0838   ALKPHOS 69 11/09/2016 0834   AST 25 10/08/2017 0838   AST 30 11/09/2016 0834   ALT 21 10/08/2017 0838   ALT 32 11/09/2016 0834   BILITOT 0.4 10/08/2017 0838   BILITOT 0.43 11/09/2016 [id number]  No results found for: LABCA2  No components found for: [id number]  No results for input(s): INR in the last 168 hours.  Urinalysis    Component Value Date/Time   COLORURINE YELLOW 09/25/2012 1320   APPEARANCEUR CLEAR 09/25/2012 1320   LABSPEC 1.016 09/25/2012 1320   PHURINE 6.0 09/25/2012 1320   GLUCOSEU NEGATIVE 09/25/2012 1320   HGBUR NEGATIVE 09/25/2012 1320   BILIRUBINUR NEGATIVE 09/25/2012 1320   KETONESUR NEGATIVE 09/25/2012 1320   PROTEINUR NEGATIVE 09/25/2012 1320   UROBILINOGEN 0.2 09/25/2012 1320   NITRITE NEGATIVE 09/25/2012 1320   LEUKOCYTESUR NEGATIVE 09/25/2012 1320    STUDIES: No results found.   ASSESSMENT: 50 y.o. 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    (a) Feraheme treatment 09/01/2016 and 09/08/2016  (b) additional Feraheme 06/26/2017 on 07/18/2017.  (c) Feraheme 10/15/2017 and 06/14/2018  (d) Feraheme 10/14/2018 and 10/21/2018  (e) Feraheme 05/22/2019 and 05/29/2019  PLAN:  Charlene [name] is doing well today.  She is iron deficient based on some labs from Dr. Stephanie Acre, so she will be receiving IV iron today, and again next week.  Her ferritin is pending today.    Deepti will continue f/u with her gyn in a couple of months for her annual well exam and will discuss her progress on the  norethindrone in order to decide on a more definitive plan regarding her heavy cycles.  We will continue to monitor her labs every 3 months and administer IV iron as needed.    I congratulated Kathlynn on her weight loss and encouraged her to continue her work.    She will return as noted above for labs every three months and we will see her in 10/2019 for f/u.  She was recommended to continue with the appropriate pandemic precautions. She knows to call for any questions that may arise between now and her next appointment.  We are happy to see her sooner if needed.  Total encounter time: 20 minutes*  Wilber Bihari, NP 05/22/19 9:40 AM Medical Oncology and Hematology [facility] [place], [place] [address] Tel. [phone]    Fax. [phone]   *Total Encounter Time as defined by the Centers for Medicare and Medicaid Services includes, in addition to the face-to-face time of a patient visit (documented in the note above) non-face-to-face time: obtaining and reviewing outside history, ordering and reviewing medications, tests or procedures, care coordination (communications with other health care professionals or caregivers) and documentation in the medical record.

## 2019-05-23 ENCOUNTER — Telehealth: Payer: Self-pay | Admitting: Adult Health

## 2019-05-23 NOTE — Telephone Encounter (Signed)
No 5/20 LOS. No changes made to pt's schedule.

## 2019-05-26 ENCOUNTER — Other Ambulatory Visit: Payer: Self-pay | Admitting: Oncology

## 2019-05-29 ENCOUNTER — Inpatient Hospital Stay: Payer: BC Managed Care – PPO

## 2019-05-29 ENCOUNTER — Other Ambulatory Visit: Payer: Self-pay

## 2019-05-29 VITALS — BP 107/60 | HR 66 | Temp 98.9°F | Resp 18

## 2019-05-29 DIAGNOSIS — D500 Iron deficiency anemia secondary to blood loss (chronic): Secondary | ICD-10-CM

## 2019-05-29 DIAGNOSIS — D508 Other iron deficiency anemias: Secondary | ICD-10-CM | POA: Diagnosis not present

## 2019-05-29 MED ORDER — SODIUM CHLORIDE 0.9 % IV SOLN
Freq: Once | INTRAVENOUS | Status: AC
  Filled 2019-05-29: qty 250

## 2019-05-29 MED ORDER — SODIUM CHLORIDE 0.9 % IV SOLN
510.0000 mg | Freq: Once | INTRAVENOUS | Status: AC
  Administered 2019-05-29: 510 mg via INTRAVENOUS
  Filled 2019-05-29: qty 510

## 2019-05-29 NOTE — Patient Instructions (Signed)

## 2019-07-14 ENCOUNTER — Inpatient Hospital Stay: Payer: BC Managed Care – PPO | Attending: Oncology

## 2019-09-23 ENCOUNTER — Other Ambulatory Visit: Payer: Self-pay | Admitting: Obstetrics

## 2019-10-14 ENCOUNTER — Inpatient Hospital Stay: Payer: BC Managed Care – PPO

## 2019-10-14 ENCOUNTER — Inpatient Hospital Stay: Payer: BC Managed Care – PPO | Attending: Oncology | Admitting: Oncology

## 2019-10-14 ENCOUNTER — Other Ambulatory Visit: Payer: Self-pay

## 2019-10-14 VITALS — BP 131/78 | HR 76 | Temp 97.5°F | Resp 18 | Ht 70.0 in | Wt 315.1 lb

## 2019-10-14 DIAGNOSIS — D508 Other iron deficiency anemias: Secondary | ICD-10-CM

## 2019-10-14 DIAGNOSIS — Z9884 Bariatric surgery status: Secondary | ICD-10-CM | POA: Insufficient documentation

## 2019-10-14 DIAGNOSIS — K909 Intestinal malabsorption, unspecified: Secondary | ICD-10-CM | POA: Diagnosis not present

## 2019-10-14 DIAGNOSIS — I10 Essential (primary) hypertension: Secondary | ICD-10-CM | POA: Diagnosis not present

## 2019-10-14 DIAGNOSIS — Z79899 Other long term (current) drug therapy: Secondary | ICD-10-CM | POA: Insufficient documentation

## 2019-10-14 DIAGNOSIS — N920 Excessive and frequent menstruation with regular cycle: Secondary | ICD-10-CM | POA: Insufficient documentation

## 2019-10-14 DIAGNOSIS — D500 Iron deficiency anemia secondary to blood loss (chronic): Secondary | ICD-10-CM | POA: Diagnosis present

## 2019-10-14 LAB — CBC WITH DIFFERENTIAL/PLATELET
Abs Immature Granulocytes: 0.04 10*3/uL (ref 0.00–0.07)
Basophils Absolute: 0.1 10*3/uL (ref 0.0–0.1)
Basophils Relative: 1 %
Eosinophils Absolute: 0.2 10*3/uL (ref 0.0–0.5)
Eosinophils Relative: 2 %
HCT: 40.9 % (ref 36.0–46.0)
Hemoglobin: 14.2 g/dL (ref 12.0–15.0)
Immature Granulocytes: 1 %
Lymphocytes Relative: 29 %
Lymphs Abs: 1.9 10*3/uL (ref 0.7–4.0)
MCH: 29.6 pg (ref 26.0–34.0)
MCHC: 34.7 g/dL (ref 30.0–36.0)
MCV: 85.4 fL (ref 80.0–100.0)
Monocytes Absolute: 0.7 10*3/uL (ref 0.1–1.0)
Monocytes Relative: 10 %
Neutro Abs: 3.8 10*3/uL (ref 1.7–7.7)
Neutrophils Relative %: 57 %
Platelets: 235 10*3/uL (ref 150–400)
RBC: 4.79 MIL/uL (ref 3.87–5.11)
RDW: 14 % (ref 11.5–15.5)
WBC: 6.6 10*3/uL (ref 4.0–10.5)
nRBC: 0 % (ref 0.0–0.2)

## 2019-10-14 LAB — RETICULOCYTES
Immature Retic Fract: 14.9 % (ref 2.3–15.9)
RBC.: 4.7 MIL/uL (ref 3.87–5.11)
Retic Count, Absolute: 112.8 10*3/uL (ref 19.0–186.0)
Retic Ct Pct: 2.4 % (ref 0.4–3.1)

## 2019-10-14 LAB — IRON AND TIBC
Iron: 85 ug/dL (ref 41–142)
Saturation Ratios: 20 % — ABNORMAL LOW (ref 21–57)
TIBC: 429 ug/dL (ref 236–444)
UIBC: 344 ug/dL (ref 120–384)

## 2019-10-14 LAB — FERRITIN: Ferritin: 168 ng/mL (ref 11–307)

## 2019-10-14 NOTE — Progress Notes (Signed)
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  She last received feraheme on 05/22/2019 and 05/29/2019.  She tolerated that with no complications  She is scheduled for hysterectomy and salpingectomy on 11/17/2019 under Dr. Pamala Hurry.   REVIEW OF SYSTEMS: Charlene [name] had the [place] vaccine x2.  She tolerated that well.  Detailed review of systems today is otherwise stable   HISTORY OF PRESENT ILLNESS: From the earlier summary note:  The patient had a gastric bypass for weight control in 2002.  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  She became significantly anemic and was receiving intravenous iron under Dr. Rockney Ghee in [place], [place].  She has now moved to this area and would like to continue to receive the intravenous iron on an as needed basis.    PAST MEDICAL HISTORY: Past Medical History:  Diagnosis Date  . Hypertension   . Lactating mother 07/13/2010  . No pertinent past medical history     PAST SURGICAL HISTORY: Past Surgical History:  Procedure Laterality Date  . GASTRIC BYPASS  2000  . HERNIA REPAIR  2003  . VENTRAL HERNIA REPAIR  2004  Aside from the gastric bypass, is only significant for herniorrhaphy and tonsillectomy and adenoidectomy.    FAMILY HISTORY Family History  Problem Relation Age of Onset  . Hypertension Mother   . Heart disease Father   . Hypertension Father    The patient's parents are living.  The patient has 3 brothers and 1 sister.  The sister had a stroke at age 75 in the setting of drug abuse.  Otherwise, the family is doing well.  There is no history of bleeding or clotting problems in the family.    GYNECOLOGIC HISTORY: She is G2, F1, P0, A1, L1.  Periods are regular, and are heavy   SOCIAL HISTORY: (Updated 10/14/18/21)  She is working remotely for [employer] in [place].  She is separated from her husband [name].  She is currently going to school for a doctoral degree in business administration. Her son [name] is 64 and Facilities manager at [employer]. She has her niece [name] who is 53 living with her along with [name], her daughter who is 21 years old.     ADVANCED DIRECTIVES: not in place   HEALTH MAINTENANCE: Social History   Tobacco Use  . Smoking status: Never Smoker  . Smokeless tobacco: Never Used  Substance Use Topics  . Alcohol use: Yes    Comment: social drinker  . Drug use: No     Colonoscopy: 2016  PAP: 2020  Bone density: n/a  Lipid panel: 2021  Allergies  Allergen Reactions  . Penicillins Hives and Shortness Of Breath    Heart rate increases    Current Outpatient Medications  Medication Sig Dispense Refill  . amLODipine (NORVASC) 5 MG tablet Take 5 mg by mouth daily.    . cyanocobalamin (,VITAMIN B-12,) 1000 MCG/ML injection Inject 1,000 mcg into the muscle every 30 (thirty) days.    Marland Kitchen  hydrochlorothiazide (MICROZIDE) 12.5 MG capsule Take 12.5 mg by mouth daily.    Marland Kitchen lamoTRIgine (LAMICTAL) 150 MG tablet   2  . levothyroxine (SYNTHROID) 75 MCG tablet Take 75 mcg by mouth daily.    . naltrexone (DEPADE) 50 MG tablet Take 50 mg daily by mouth.  3  . norethindrone (AYGESTIN) 5 MG tablet Take by mouth.    . traZODone (DESYREL) 50 MG tablet Take 50-100 mg by mouth at bedtime as needed.    . Vitamin D, Ergocalciferol, (DRISDOL) 50000 units CAPS capsule Take 1 capsule once a week by mouth.      No  current facility-administered medications for this visit.    OBJECTIVE: African-American woman who appears well  Vitals:   10/14/19 0936  BP: 131/78  Pulse: 76  Resp: 18  Temp: (!) 97.5 F (36.4 C)  SpO2: 98%   Wt Readings from Last 3 Encounters:  10/14/19 (!) 315 lb 1.6 oz (142.9 kg)  05/22/19 (!) 306 lb 6.4 oz (139 kg)  10/14/18 (!) 320 lb 11.2 oz (145.5 kg)   Body mass index is 45.21 kg/m.    ECOG FS:1 - Symptomatic but completely ambulatory  Sclerae unicteric, EOMs intact Wearing a mask No cervical or supraclavicular adenopathy Lungs no rales or rhonchi Heart regular rate and rhythm Abd soft, obese, nontender, positive bowel sounds MSK no focal spinal tenderness, no upper extremity lymphedema Neuro: nonfocal, well oriented, appropriate affect Breasts: No masses palpated.  Both axillae are benign.    LAB RESULTS: Lab Results  Component Value Date   WBC 6.6 10/14/2019   NEUTROABS 3.8 10/14/2019   HGB 14.2 10/14/2019   HCT 40.9 10/14/2019   MCV 85.4 10/14/2019   PLT 235 10/14/2019      Chemistry      Component Value Date/Time   NA 139 10/08/2017 0838   NA 139 11/09/2016 0834   K 3.6 10/08/2017 0838   K 3.9 11/09/2016 0834   CL 105 10/08/2017 0838   CO2 26 10/08/2017 0838   CO2 22 11/09/2016 0834   BUN 12 10/08/2017 0838   BUN 8.4 11/09/2016 0834   CREATININE 0.78 10/08/2017 0838   CREATININE 0.8 11/09/2016 0834      Component Value Date/Time   CALCIUM 8.9 10/08/2017 0838   CALCIUM 8.7 11/09/2016 0834   ALKPHOS 66 10/08/2017 0838   ALKPHOS 69 11/09/2016 0834   AST 25 10/08/2017 0838   AST 30 11/09/2016 0834   ALT 21 10/08/2017 0838   ALT 32 11/09/2016 0834   BILITOT 0.4 10/08/2017 0838   BILITOT 0.43 11/09/2016 0834      No results found for: LABCA2  No components found for: [id number]  No results for input(s): INR in the last 168 hours.  Urinalysis    Component Value Date/Time   COLORURINE YELLOW 09/25/2012 1320   APPEARANCEUR CLEAR  09/25/2012 1320   LABSPEC 1.016 09/25/2012 1320   PHURINE 6.0 09/25/2012 1320   GLUCOSEU NEGATIVE 09/25/2012 1320   HGBUR NEGATIVE 09/25/2012 1320   BILIRUBINUR NEGATIVE 09/25/2012 1320   KETONESUR NEGATIVE 09/25/2012 1320   PROTEINUR NEGATIVE 09/25/2012 1320   UROBILINOGEN 0.2 09/25/2012 1320   NITRITE NEGATIVE 09/25/2012 1320   LEUKOCYTESUR NEGATIVE 09/25/2012 1320    STUDIES: No results found.   ASSESSMENT: 49 y.o. 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    (a) Feraheme treatment 09/01/2016 and 09/08/2016  (b) additional Feraheme 06/26/2017  on 07/18/2017.  (c) Feraheme 10/15/2017 and 06/14/2018  (d) Feraheme 10/14/2018 and 10/21/2018  (e) Feraheme 05/22/2019 and 05/29/2019   PLAN:  Charlene [name] has an excellent hemoglobin and is making plenty red cells.  I do not have her iron studies from today yet.  She is already scheduled for hysterectomy early November.  That will largely take care of the iron issue.  I am setting her up for lab work the first week in December and a virtual visit the second week in December.  At that time she may or may not need additional IV iron before being discharged from follow-up  She knows to call for any other issue that may develop before the next visit  Total encounter time 20 minutes.Sarajane Jews C. Samiha Denapoli, MD 10/14/19 9:52 AM Medical Oncology and Hematology [facility] [place], [place] [address] Tel. [phone]    Fax. [phone]   I, Wilburn Mylar, am acting as scribe for Dr. Virgie Dad. Charlene [name].  I, Lurline Del MD, have reviewed the above documentation for accuracy and completeness, and I agree with the above.    *Total Encounter Time as defined by the Centers for Medicare and Medicaid Services includes, in addition to the face-to-face time of a patient visit (documented in the  note above) non-face-to-face time: obtaining and reviewing outside history, ordering and reviewing medications, tests or procedures, care coordination (communications with other health care professionals or caregivers) and documentation in the medical record.

## 2019-10-16 ENCOUNTER — Telehealth: Payer: Self-pay | Admitting: Oncology

## 2019-10-16 NOTE — Telephone Encounter (Signed)
Scheduled per 10/12 los. Called and spoke with pt, confirmed added appts

## 2019-10-27 ENCOUNTER — Other Ambulatory Visit: Payer: Self-pay | Admitting: Obstetrics

## 2019-11-06 NOTE — Patient Instructions (Addendum)
YOU ARE SCHEDULED FOR A COVID TEST _11/11________@__12 :30 pm__________. THIS TEST MUST BE DONE BEFORE SURGERY. GO TO  [facility]. [place], [place], IT IS APPROXIMATELY 2 MINUTES PAST [employer] ON THE RIGHT AND REMAIN IN YOUR CAR, THIS IS A DRIVE UP TEST. ONCE YOUR COVID TEST IS DONE PLEASE FOLLOW ALL THE QUARANTINE  INSTRUCTIONS GIVEN IN YOUR HANDOUT.      Your procedure is scheduled on 11/17/19   Report to [place]. M.   Call this number if you have problems the morning of surgery  :[phone].   OUR ADDRESS IS [address].  WE ARE LOCATED [facility].  PLEASE BRING YOUR INSURANCE CARD AND PHOTO ID DAY OF SURGERY.  ONLY ONE PERSON ALLOWED IN FACILITY WAITING AREA.                                     REMEMBER:  DO NOT EAT FOOD OR DRINK LIQUIDS AFTER MIDNIGHT .    YOU MAY  BRUSH YOUR TEETH MORNING OF SURGERY AND RINSE YOUR MOUTH OUT, NO CHEWING GUM CANDY OR MINTS.   TAKE THESE MEDICATIONS MORNING OF SURGERY WITH A SIP OF WATER: Lamotragine, Amlodipine, Levothyroxine  ONE VISITOR IS ALLOWED IN WAITING ROOM ONLY DAY OF SURGERY.    NO VISITOR MAY SPEND THE NIGHT.  VISITOR ARE ALLOWED TO STAY UNTIL 8:00 PM.                                    DO NOT WEAR JEWERLY, MAKE UP, OR NAIL POLISH ON FINGERNAILS.  DO NOT WEAR LOTIONS, POWDERS, PERFUMES OR DEODORANT.  DO NOT SHAVE FOR 24 HOURS PRIOR TO DAY OF SURGERY.  CONTACTS, GLASSES, OR DENTURES MAY NOT BE WORN TO SURGERY.                                    [facility] IS NOT RESPONSIBLE  FOR ANY BELONGINGS.                                                                    Marland Kitchen                               [facility] - Preparing for Surgery  Before surgery, you can play an important role.   Because skin is not sterile, your skin needs to be as free of germs as possible.   You can reduce the number of germs on your skin by washing with CHG (chlorahexidine  gluconate) soap before surgery.   CHG is an antiseptic cleaner which kills germs and bonds with the skin to continue killing germs even after washing. Please DO NOT use if you have an allergy to CHG or antibacterial soaps.   If your skin becomes reddened/irritated stop using the CHG and inform your nurse when you arrive at Short Stay. Do not shave (including legs and underarms)  for at least 48 hours prior to the first CHG shower.   Please follow these instructions carefully:  1.  Shower with CHG Soap the night before surgery and the  morning of Surgery.  2.  If you choose to wash your hair, wash your hair first as usual with your  normal  shampoo.  3.  After you shampoo, rinse your hair and body thoroughly to remove the  shampoo.                                        4.  Use CHG as you would any other liquid soap.  You can apply chg directly  to the skin and wash                       Gently with a scrungie or clean washcloth.  5.  Apply the CHG Soap to your body ONLY FROM THE NECK DOWN.   Do not use on face/ open                           Wound or open sores. Avoid contact with eyes, ears mouth and genitals (private parts).                       Wash face,  Genitals (private parts) with your normal soap.             6.  Wash thoroughly, paying special attention to the area where your surgery  will be performed.  7.  Thoroughly rinse your body with warm water from the neck down.  8.  DO NOT shower/wash with your normal soap after using and rinsing off  the CHG Soap.             9.  Pat yourself dry with a clean towel.            10.  Wear clean pajamas.            11.  Place clean sheets on your bed the night of your first shower and do not  sleep with pets. Day of Surgery : Do not apply any lotions/deodorants the morning of surgery.  Please wear clean clothes to the hospital/surgery center.  FAILURE TO FOLLOW THESE INSTRUCTIONS MAY RESULT IN THE CANCELLATION OF YOUR SURGERY PATIENT  SIGNATURE_________________________________  NURSE SIGNATURE__________________________________  ________________________________________________________________________

## 2019-11-07 ENCOUNTER — Encounter (HOSPITAL_COMMUNITY)
Admission: RE | Admit: 2019-11-07 | Discharge: 2019-11-07 | Disposition: A | Discharge status: Home / Self Care | Payer: BC Managed Care – PPO | Source: Ambulatory Visit | Attending: Obstetrics | Admitting: Obstetrics

## 2019-11-07 ENCOUNTER — Other Ambulatory Visit: Payer: Self-pay

## 2019-11-07 ENCOUNTER — Encounter (HOSPITAL_COMMUNITY): Payer: Self-pay

## 2019-11-07 DIAGNOSIS — I10 Essential (primary) hypertension: Secondary | ICD-10-CM | POA: Insufficient documentation

## 2019-11-07 DIAGNOSIS — Z01818 Encounter for other preprocedural examination: Secondary | ICD-10-CM | POA: Diagnosis not present

## 2019-11-07 LAB — CBC
HCT: 37.2 % (ref 36.0–46.0)
Hemoglobin: 12.7 g/dL (ref 12.0–15.0)
MCH: 29.6 pg (ref 26.0–34.0)
MCHC: 34.1 g/dL (ref 30.0–36.0)
MCV: 86.7 fL (ref 80.0–100.0)
Platelets: 344 10*3/uL (ref 150–400)
RBC: 4.29 MIL/uL (ref 3.87–5.11)
RDW: 14.4 % (ref 11.5–15.5)
WBC: 5.8 10*3/uL (ref 4.0–10.5)
nRBC: 0 % (ref 0.0–0.2)

## 2019-11-07 LAB — BASIC METABOLIC PANEL
Anion gap: 12 (ref 5–15)
BUN: 8 mg/dL (ref 6–20)
CO2: 22 mmol/L (ref 22–32)
Calcium: 8.1 mg/dL — ABNORMAL LOW (ref 8.9–10.3)
Chloride: 106 mmol/L (ref 98–111)
Creatinine, Ser: 0.76 mg/dL (ref 0.44–1.00)
GFR, Estimated: >60 mL/min (ref >60)
Glucose, Bld: 74 mg/dL (ref 70–99)
Potassium: 3.6 mmol/L (ref 3.5–5.1)
Sodium: 140 mmol/L (ref 135–145)

## 2019-11-07 NOTE — Progress Notes (Signed)
COVID Vaccine Completed:Yes Date COVID Vaccine completed:07/11/19 COVID vaccine manufacturer: Pfizer     PCP - Dr. Solon Palm Cardiologist - none  Chest x-ray - no EKG -  Stress Test - no ECHO - no Cardiac Cath - no Pacemaker/ICD device last checked:NA  Sleep Study - no CPAP -   Fasting Blood Sugar - NA Checks Blood Sugar _____ times a day  Blood Thinner Instructions:NA Aspirin Instructions: Last Dose:  Anesthesia review:   Patient denies shortness of breath, fever, cough and chest pain at PAT appointment yes   Patient verbalized understanding of instructions that were given to them at the PAT appointment. Patient was also instructed that they will need to review over the PAT instructions again at home before surgery. Yes Pt states that she doesn't get SOB climbing stairs, doing housework or with ADLs.  She had gastric bypass and lost 100lbs. Her BMI is 45.4

## 2019-11-13 ENCOUNTER — Other Ambulatory Visit (HOSPITAL_COMMUNITY)
Admission: RE | Admit: 2019-11-13 | Discharge: 2019-11-13 | Disposition: A | Discharge status: Home / Self Care | Payer: BC Managed Care – PPO | Source: Ambulatory Visit | Attending: Obstetrics | Admitting: Obstetrics

## 2019-11-13 DIAGNOSIS — Z01812 Encounter for preprocedural laboratory examination: Secondary | ICD-10-CM | POA: Diagnosis present

## 2019-11-13 DIAGNOSIS — Z20822 Contact with and (suspected) exposure to covid-19: Secondary | ICD-10-CM | POA: Insufficient documentation

## 2019-11-13 LAB — SARS CORONAVIRUS 2 (TAT 6-24 HRS): SARS Coronavirus 2: NEGATIVE

## 2019-11-17 ENCOUNTER — Encounter (HOSPITAL_BASED_OUTPATIENT_CLINIC_OR_DEPARTMENT_OTHER)
Admission: RE | Disposition: A | Discharge status: Home / Self Care | Payer: Self-pay | Source: Ambulatory Visit | Attending: Obstetrics

## 2019-11-17 ENCOUNTER — Ambulatory Visit (HOSPITAL_BASED_OUTPATIENT_CLINIC_OR_DEPARTMENT_OTHER): Payer: BC Managed Care – PPO | Admitting: Certified Registered Nurse Anesthetist

## 2019-11-17 ENCOUNTER — Ambulatory Visit (HOSPITAL_BASED_OUTPATIENT_CLINIC_OR_DEPARTMENT_OTHER)
Admission: RE | Admit: 2019-11-17 | Discharge: 2019-11-18 | Disposition: A | Discharge status: Home / Self Care | Payer: BC Managed Care – PPO | Source: Ambulatory Visit | Attending: Obstetrics | Admitting: Obstetrics

## 2019-11-17 ENCOUNTER — Encounter (HOSPITAL_BASED_OUTPATIENT_CLINIC_OR_DEPARTMENT_OTHER): Payer: Self-pay | Admitting: Obstetrics

## 2019-11-17 DIAGNOSIS — D259 Leiomyoma of uterus, unspecified: Secondary | ICD-10-CM | POA: Diagnosis not present

## 2019-11-17 DIAGNOSIS — N920 Excessive and frequent menstruation with regular cycle: Secondary | ICD-10-CM | POA: Diagnosis present

## 2019-11-17 DIAGNOSIS — Z888 Allergy status to other drugs, medicaments and biological substances status: Secondary | ICD-10-CM | POA: Diagnosis not present

## 2019-11-17 DIAGNOSIS — D509 Iron deficiency anemia, unspecified: Secondary | ICD-10-CM | POA: Diagnosis not present

## 2019-11-17 DIAGNOSIS — Z79899 Other long term (current) drug therapy: Secondary | ICD-10-CM | POA: Insufficient documentation

## 2019-11-17 DIAGNOSIS — Z880 Allergy status to penicillin: Secondary | ICD-10-CM | POA: Insufficient documentation

## 2019-11-17 DIAGNOSIS — Z6841 Body Mass Index (BMI) 40.0 and over, adult: Secondary | ICD-10-CM | POA: Diagnosis not present

## 2019-11-17 DIAGNOSIS — I10 Essential (primary) hypertension: Secondary | ICD-10-CM | POA: Insufficient documentation

## 2019-11-17 DIAGNOSIS — Z90710 Acquired absence of both cervix and uterus: Secondary | ICD-10-CM | POA: Diagnosis present

## 2019-11-17 HISTORY — PX: ROBOTIC ASSISTED LAPAROSCOPIC HYSTERECTOMY AND SALPINGECTOMY: SHX6379

## 2019-11-17 LAB — TYPE AND SCREEN
ABO/RH(D): O POS
Antibody Screen: NEGATIVE

## 2019-11-17 LAB — POCT PREGNANCY, URINE: Preg Test, Ur: NEGATIVE

## 2019-11-17 SURGERY — XI ROBOTIC ASSISTED LAPAROSCOPIC HYSTERECTOMY AND SALPINGECTOMY
Anesthesia: General | Laterality: Bilateral

## 2019-11-17 MED ORDER — SUGAMMADEX SODIUM 200 MG/2ML IV SOLN
INTRAVENOUS | Status: DC | PRN
  Administered 2019-11-17: 200 mg via INTRAVENOUS

## 2019-11-17 MED ORDER — FENTANYL CITRATE (PF) 100 MCG/2ML IJ SOLN
INTRAMUSCULAR | Status: AC
  Filled 2019-11-17: qty 2

## 2019-11-17 MED ORDER — KETOROLAC TROMETHAMINE 30 MG/ML IJ SOLN
INTRAMUSCULAR | Status: AC
  Filled 2019-11-17: qty 1

## 2019-11-17 MED ORDER — DEXAMETHASONE SODIUM PHOSPHATE 10 MG/ML IJ SOLN
INTRAMUSCULAR | Status: DC | PRN
  Administered 2019-11-17: 10 mg via INTRAVENOUS

## 2019-11-17 MED ORDER — ONDANSETRON HCL 4 MG PO TABS: 4.0000 mg | ORAL_TABLET | Freq: Four times a day (QID) | ORAL | Status: DC | PRN

## 2019-11-17 MED ORDER — PHENYLEPHRINE 40 MCG/ML (10ML) SYRINGE FOR IV PUSH (FOR BLOOD PRESSURE SUPPORT)
PREFILLED_SYRINGE | INTRAVENOUS | Status: AC
  Filled 2019-11-17: qty 10

## 2019-11-17 MED ORDER — ALBUTEROL SULFATE HFA 108 (90 BASE) MCG/ACT IN AERS
INHALATION_SPRAY | RESPIRATORY_TRACT | Status: DC | PRN
  Administered 2019-11-17: 6 via RESPIRATORY_TRACT

## 2019-11-17 MED ORDER — HYDROMORPHONE HCL 1 MG/ML IJ SOLN
0.2000 mg | INTRAMUSCULAR | Status: DC | PRN
  Administered 2019-11-17: 0.5 mg via INTRAVENOUS

## 2019-11-17 MED ORDER — PHENYLEPHRINE 40 MCG/ML (10ML) SYRINGE FOR IV PUSH (FOR BLOOD PRESSURE SUPPORT)
PREFILLED_SYRINGE | INTRAVENOUS | Status: DC | PRN
  Administered 2019-11-17 (×2): 80 ug via INTRAVENOUS
  Administered 2019-11-17: 40 ug via INTRAVENOUS
  Administered 2019-11-17: 80 ug via INTRAVENOUS
  Administered 2019-11-17: 40 ug via INTRAVENOUS
  Administered 2019-11-17: 80 ug via INTRAVENOUS
  Administered 2019-11-17: 120 ug via INTRAVENOUS
  Administered 2019-11-17: 80 ug via INTRAVENOUS
  Administered 2019-11-17: 40 ug via INTRAVENOUS
  Administered 2019-11-17: 80 ug via INTRAVENOUS

## 2019-11-17 MED ORDER — ALBUMIN HUMAN 5 % IV SOLN: INTRAVENOUS | Status: DC | PRN

## 2019-11-17 MED ORDER — GENTAMICIN SULFATE 40 MG/ML IJ SOLN: 1.5000 mg/kg | Freq: Three times a day (TID) | INTRAVENOUS | Status: DC

## 2019-11-17 MED ORDER — MIDAZOLAM HCL 5 MG/5ML IJ SOLN
INTRAMUSCULAR | Status: DC | PRN
  Administered 2019-11-17: 2 mg via INTRAVENOUS

## 2019-11-17 MED ORDER — PANTOPRAZOLE SODIUM 40 MG PO TBEC
40.0000 mg | DELAYED_RELEASE_TABLET | Freq: Every day | ORAL | Status: DC
  Administered 2019-11-17: 40 mg via ORAL

## 2019-11-17 MED ORDER — ONDANSETRON HCL 4 MG/2ML IJ SOLN: 4.0000 mg | Freq: Four times a day (QID) | INTRAMUSCULAR | Status: DC | PRN

## 2019-11-17 MED ORDER — SODIUM CHLORIDE 0.9 % IR SOLN
Status: DC | PRN
  Administered 2019-11-17: 1000 mL

## 2019-11-17 MED ORDER — ACETAMINOPHEN 500 MG PO TABS
ORAL_TABLET | ORAL | Status: AC
  Filled 2019-11-17: qty 2

## 2019-11-17 MED ORDER — ACETAMINOPHEN 325 MG PO TABS: 325.0000 mg | ORAL_TABLET | Freq: Once | ORAL | Status: DC | PRN

## 2019-11-17 MED ORDER — ROCURONIUM BROMIDE 10 MG/ML (PF) SYRINGE
PREFILLED_SYRINGE | INTRAVENOUS | Status: AC
  Filled 2019-11-17: qty 10

## 2019-11-17 MED ORDER — LEVOTHYROXINE SODIUM 75 MCG PO TABS
75.0000 ug | ORAL_TABLET | Freq: Every day | ORAL | Status: DC
  Filled 2019-11-17: qty 1

## 2019-11-17 MED ORDER — ONDANSETRON HCL 4 MG/2ML IJ SOLN
INTRAMUSCULAR | Status: AC
  Filled 2019-11-17: qty 2

## 2019-11-17 MED ORDER — ACETAMINOPHEN 10 MG/ML IV SOLN
1000.0000 mg | Freq: Once | INTRAVENOUS | Status: DC | PRN
  Administered 2019-11-17: 1000 mg via INTRAVENOUS

## 2019-11-17 MED ORDER — MIDAZOLAM HCL 2 MG/2ML IJ SOLN
1.0000 mg | Freq: Once | INTRAMUSCULAR | Status: AC
  Administered 2019-11-17: 1 mg via INTRAVENOUS

## 2019-11-17 MED ORDER — GENTAMICIN SULFATE 40 MG/ML IJ SOLN
480.0000 mg | INTRAVENOUS | Status: DC
  Filled 2019-11-17: qty 12

## 2019-11-17 MED ORDER — IBUPROFEN 800 MG PO TABS: 800.0000 mg | ORAL_TABLET | Freq: Four times a day (QID) | ORAL | Status: DC

## 2019-11-17 MED ORDER — ALBUMIN HUMAN 5 % IV SOLN
INTRAVENOUS | Status: AC
  Filled 2019-11-17: qty 250

## 2019-11-17 MED ORDER — BUPIVACAINE HCL (PF) 0.25 % IJ SOLN
INTRAMUSCULAR | Status: AC
  Filled 2019-11-17: qty 30

## 2019-11-17 MED ORDER — HEMOSTATIC AGENTS (NO CHARGE) OPTIME
TOPICAL | Status: DC | PRN
  Administered 2019-11-17: 1 via TOPICAL

## 2019-11-17 MED ORDER — KETOROLAC TROMETHAMINE 30 MG/ML IJ SOLN
30.0000 mg | Freq: Four times a day (QID) | INTRAMUSCULAR | Status: DC
  Administered 2019-11-17 – 2019-11-18 (×2): 30 mg via INTRAVENOUS

## 2019-11-17 MED ORDER — OXYCODONE HCL 5 MG PO TABS
ORAL_TABLET | ORAL | Status: AC
  Filled 2019-11-17: qty 2

## 2019-11-17 MED ORDER — CLINDAMYCIN PHOSPHATE 900 MG/50ML IV SOLN
INTRAVENOUS | Status: AC
  Filled 2019-11-17: qty 50

## 2019-11-17 MED ORDER — LIDOCAINE 2% (20 MG/ML) 5 ML SYRINGE
INTRAMUSCULAR | Status: DC | PRN
  Administered 2019-11-17: 25 mg via INTRAVENOUS

## 2019-11-17 MED ORDER — PROPOFOL 10 MG/ML IV BOLUS
INTRAVENOUS | Status: DC | PRN
  Administered 2019-11-17: 130 mg via INTRAVENOUS

## 2019-11-17 MED ORDER — DEXAMETHASONE SODIUM PHOSPHATE 10 MG/ML IJ SOLN
INTRAMUSCULAR | Status: AC
  Filled 2019-11-17: qty 1

## 2019-11-17 MED ORDER — SODIUM CHLORIDE 0.9 % IV SOLN
INTRAVENOUS | Status: DC | PRN
  Administered 2019-11-17: 60 mL

## 2019-11-17 MED ORDER — LIDOCAINE HCL (PF) 2 % IJ SOLN
INTRAMUSCULAR | Status: DC | PRN
  Administered 2019-11-17: 1.5 mg/kg/h via INTRADERMAL

## 2019-11-17 MED ORDER — LACTATED RINGERS IV SOLN: INTRAVENOUS | Status: DC

## 2019-11-17 MED ORDER — OXYCODONE HCL 5 MG/5ML PO SOLN: 5.0000 mg | Freq: Once | ORAL | Status: DC | PRN

## 2019-11-17 MED ORDER — KETOROLAC TROMETHAMINE 30 MG/ML IJ SOLN: 30.0000 mg | Freq: Once | INTRAMUSCULAR | Status: DC

## 2019-11-17 MED ORDER — SIMETHICONE 80 MG PO CHEW: 80.0000 mg | CHEWABLE_TABLET | Freq: Four times a day (QID) | ORAL | Status: DC | PRN

## 2019-11-17 MED ORDER — AMISULPRIDE (ANTIEMETIC) 5 MG/2ML IV SOLN: 10.0000 mg | Freq: Once | INTRAVENOUS | Status: DC | PRN

## 2019-11-17 MED ORDER — FENTANYL CITRATE (PF) 100 MCG/2ML IJ SOLN
INTRAMUSCULAR | Status: DC | PRN
  Administered 2019-11-17: 100 ug via INTRAVENOUS
  Administered 2019-11-17 (×2): 50 ug via INTRAVENOUS

## 2019-11-17 MED ORDER — DEXTROSE 5 % IV SOLN: 3.0000 g | INTRAVENOUS | Status: DC

## 2019-11-17 MED ORDER — BUPIVACAINE HCL (PF) 0.25 % IJ SOLN
INTRAMUSCULAR | Status: DC | PRN
  Administered 2019-11-17: 11 mL

## 2019-11-17 MED ORDER — MIDAZOLAM HCL 2 MG/2ML IJ SOLN
INTRAMUSCULAR | Status: AC
  Filled 2019-11-17: qty 2

## 2019-11-17 MED ORDER — GLYCOPYRROLATE PF 0.2 MG/ML IJ SOSY
PREFILLED_SYRINGE | INTRAMUSCULAR | Status: DC | PRN
  Administered 2019-11-17: .2 mg via INTRAVENOUS

## 2019-11-17 MED ORDER — ALBUMIN HUMAN 5 % IV SOLN: 12.5000 g | Freq: Once | INTRAVENOUS | Status: DC

## 2019-11-17 MED ORDER — PROPOFOL 10 MG/ML IV BOLUS
INTRAVENOUS | Status: AC
  Filled 2019-11-17: qty 20

## 2019-11-17 MED ORDER — PANTOPRAZOLE SODIUM 40 MG PO TBEC
DELAYED_RELEASE_TABLET | ORAL | Status: AC
  Filled 2019-11-17: qty 1

## 2019-11-17 MED ORDER — OXYCODONE HCL 5 MG PO TABS: 5.0000 mg | ORAL_TABLET | Freq: Once | ORAL | Status: DC | PRN

## 2019-11-17 MED ORDER — GENTAMICIN SULFATE 40 MG/ML IJ SOLN
480.0000 mg | INTRAVENOUS | Status: AC
  Administered 2019-11-17: 480 mg via INTRAVENOUS
  Filled 2019-11-17: qty 12

## 2019-11-17 MED ORDER — KETAMINE HCL 10 MG/ML IJ SOLN
INTRAMUSCULAR | Status: AC
  Filled 2019-11-17: qty 1

## 2019-11-17 MED ORDER — SODIUM CHLORIDE (PF) 0.9 % IJ SOLN
INTRAMUSCULAR | Status: AC
  Filled 2019-11-17: qty 50

## 2019-11-17 MED ORDER — ROPIVACAINE HCL 5 MG/ML IJ SOLN
INTRAMUSCULAR | Status: AC
  Filled 2019-11-17: qty 30

## 2019-11-17 MED ORDER — MENTHOL 3 MG MT LOZG: 1.0000 | LOZENGE | OROMUCOSAL | Status: DC | PRN

## 2019-11-17 MED ORDER — HYDROMORPHONE HCL 1 MG/ML IJ SOLN
INTRAMUSCULAR | Status: AC
  Filled 2019-11-17: qty 1

## 2019-11-17 MED ORDER — MEPERIDINE HCL 25 MG/ML IJ SOLN: 6.2500 mg | INTRAMUSCULAR | Status: DC | PRN

## 2019-11-17 MED ORDER — LIDOCAINE 2% (20 MG/ML) 5 ML SYRINGE
INTRAMUSCULAR | Status: AC
  Filled 2019-11-17: qty 5

## 2019-11-17 MED ORDER — EPHEDRINE SULFATE-NACL 50-0.9 MG/10ML-% IV SOSY
PREFILLED_SYRINGE | INTRAVENOUS | Status: DC | PRN
  Administered 2019-11-17: 5 mg via INTRAVENOUS

## 2019-11-17 MED ORDER — ALBUTEROL SULFATE HFA 108 (90 BASE) MCG/ACT IN AERS
INHALATION_SPRAY | RESPIRATORY_TRACT | Status: AC
  Filled 2019-11-17: qty 6.7

## 2019-11-17 MED ORDER — OXYCODONE HCL 5 MG PO TABS
5.0000 mg | ORAL_TABLET | ORAL | Status: DC | PRN
  Administered 2019-11-17 – 2019-11-18 (×5): 10 mg via ORAL

## 2019-11-17 MED ORDER — ROCURONIUM BROMIDE 10 MG/ML (PF) SYRINGE
PREFILLED_SYRINGE | INTRAVENOUS | Status: DC | PRN
  Administered 2019-11-17: 30 mg via INTRAVENOUS
  Administered 2019-11-17: 20 mg via INTRAVENOUS
  Administered 2019-11-17: 50 mg via INTRAVENOUS

## 2019-11-17 MED ORDER — GLYCOPYRROLATE PF 0.2 MG/ML IJ SOSY
PREFILLED_SYRINGE | INTRAMUSCULAR | Status: AC
  Filled 2019-11-17: qty 1

## 2019-11-17 MED ORDER — KETOROLAC TROMETHAMINE 30 MG/ML IJ SOLN
INTRAMUSCULAR | Status: DC | PRN
  Administered 2019-11-17: 30 mg via INTRAVENOUS

## 2019-11-17 MED ORDER — POVIDONE-IODINE 10 % EX SWAB: 2.0000 "application " | Freq: Once | CUTANEOUS | Status: DC

## 2019-11-17 MED ORDER — EPHEDRINE 5 MG/ML INJ
INTRAVENOUS | Status: AC
  Filled 2019-11-17: qty 20

## 2019-11-17 MED ORDER — ONDANSETRON HCL 4 MG/2ML IJ SOLN
INTRAMUSCULAR | Status: DC | PRN
  Administered 2019-11-17: 4 mg via INTRAVENOUS

## 2019-11-17 MED ORDER — KETAMINE HCL 10 MG/ML IJ SOLN
INTRAMUSCULAR | Status: DC | PRN
  Administered 2019-11-17: 30 mg via INTRAVENOUS
  Administered 2019-11-17: 10 mg via INTRAVENOUS

## 2019-11-17 MED ORDER — ACETAMINOPHEN 500 MG PO TABS
1000.0000 mg | ORAL_TABLET | Freq: Four times a day (QID) | ORAL | Status: DC
  Administered 2019-11-17 – 2019-11-18 (×2): 1000 mg via ORAL

## 2019-11-17 MED ORDER — CLINDAMYCIN PHOSPHATE 900 MG/50ML IV SOLN
900.0000 mg | INTRAVENOUS | Status: AC
  Administered 2019-11-17: 900 mg via INTRAVENOUS

## 2019-11-17 MED ORDER — FENTANYL CITRATE (PF) 100 MCG/2ML IJ SOLN: 25.0000 ug | INTRAMUSCULAR | Status: DC | PRN

## 2019-11-17 MED ORDER — ACETAMINOPHEN 10 MG/ML IV SOLN
INTRAVENOUS | Status: AC
  Filled 2019-11-17: qty 100

## 2019-11-17 MED ORDER — ACETAMINOPHEN 160 MG/5ML PO SOLN: 325.0000 mg | Freq: Once | ORAL | Status: DC | PRN

## 2019-11-17 SURGICAL SUPPLY — 63 items
ADH SKN CLS APL DERMABOND .7 (GAUZE/BANDAGES/DRESSINGS) ×1
APL PRP STRL LF DISP 70% ISPRP (MISCELLANEOUS)
APL SRG 38 LTWT LNG FL B (MISCELLANEOUS) ×1
APPLICATOR ARISTA FLEXITIP XL (MISCELLANEOUS) ×2 IMPLANT
BARRIER ADHS 3X4 INTERCEED (GAUZE/BANDAGES/DRESSINGS) IMPLANT
BRR ADH 4X3 ABS CNTRL BYND (GAUZE/BANDAGES/DRESSINGS)
CHLORAPREP W/TINT 26 (MISCELLANEOUS) IMPLANT
CNTNR URN SCR LID CUP LEK RST (MISCELLANEOUS) ×1 IMPLANT
CONT SPEC 4OZ STRL OR WHT (MISCELLANEOUS) ×2
COVER BACK TABLE 60X90IN (DRAPES) ×2 IMPLANT
COVER TIP SHEARS 8 DVNC (MISCELLANEOUS) ×1 IMPLANT
COVER TIP SHEARS 8MM DA VINCI (MISCELLANEOUS) ×2
DECANTER SPIKE VIAL GLASS SM (MISCELLANEOUS) ×4 IMPLANT
DEFOGGER SCOPE WARMER CLEARIFY (MISCELLANEOUS) ×2 IMPLANT
DERMABOND ADVANCED (GAUZE/BANDAGES/DRESSINGS) ×1
DERMABOND ADVANCED .7 DNX12 (GAUZE/BANDAGES/DRESSINGS) ×1 IMPLANT
DRAPE ARM DVNC X/XI (DISPOSABLE) ×4 IMPLANT
DRAPE COLUMN DVNC XI (DISPOSABLE) ×1 IMPLANT
DRAPE DA VINCI XI ARM (DISPOSABLE) ×8
DRAPE DA VINCI XI COLUMN (DISPOSABLE) ×2
DRAPE UTILITY XL STRL (DRAPES) ×2 IMPLANT
ELECT REM PT RETURN 9FT ADLT (ELECTROSURGICAL) ×2
ELECTRODE REM PT RTRN 9FT ADLT (ELECTROSURGICAL) ×1 IMPLANT
GLOVE BIO SURGEON STRL SZ 6.5 (GLOVE) ×6 IMPLANT
GLOVE BIOGEL PI IND STRL 7.0 (GLOVE) ×5 IMPLANT
GLOVE BIOGEL PI INDICATOR 7.0 (GLOVE) ×5
HEMOSTAT ARISTA ABSORB 3G PWDR (HEMOSTASIS) ×2 IMPLANT
HOLDER FOLEY CATH W/STRAP (MISCELLANEOUS) ×2 IMPLANT
IRRIG SUCT STRYKERFLOW 2 WTIP (MISCELLANEOUS) ×2
IRRIGATION SUCT STRKRFLW 2 WTP (MISCELLANEOUS) ×1 IMPLANT
IV NS 1000ML (IV SOLUTION) ×2
IV NS 1000ML BAXH (IV SOLUTION) ×1 IMPLANT
LEGGING LITHOTOMY PAIR STRL (DRAPES) ×2 IMPLANT
OBTURATOR OPTICAL STANDARD 8MM (TROCAR) ×2
OBTURATOR OPTICAL STND 8 DVNC (TROCAR) ×1
OBTURATOR OPTICALSTD 8 DVNC (TROCAR) ×1 IMPLANT
OCCLUDER COLPOPNEUMO (BALLOONS) ×2 IMPLANT
PACK ROBOT WH (CUSTOM PROCEDURE TRAY) ×2 IMPLANT
PACK ROBOTIC GOWN (GOWN DISPOSABLE) ×2 IMPLANT
PACK TRENDGUARD 450 HYBRID PRO (MISCELLANEOUS) ×1 IMPLANT
PAD OB MATERNITY 4.3X12.25 (PERSONAL CARE ITEMS) ×2 IMPLANT
PAD PREP 24X48 CUFFED NSTRL (MISCELLANEOUS) ×2 IMPLANT
PROTECTOR NERVE ULNAR (MISCELLANEOUS) ×4 IMPLANT
SEAL CANN UNIV 5-8 DVNC XI (MISCELLANEOUS) ×4 IMPLANT
SEAL XI 5MM-8MM UNIVERSAL (MISCELLANEOUS) ×8
SEALER VESSEL DA VINCI XI (MISCELLANEOUS) ×2
SEALER VESSEL EXT DVNC XI (MISCELLANEOUS) ×1 IMPLANT
SET TRI-LUMEN FLTR TB AIRSEAL (TUBING) ×2 IMPLANT
SUT VIC AB 0 CT1 27 (SUTURE) ×4
SUT VIC AB 0 CT1 27XBRD ANBCTR (SUTURE) ×2 IMPLANT
SUT VIC AB 4-0 PS2 27 (SUTURE) ×4 IMPLANT
SUT VICRYL 0 UR6 27IN ABS (SUTURE) ×2 IMPLANT
SUT VLOC 180 0 9IN  GS21 (SUTURE) ×2
SUT VLOC 180 0 9IN GS21 (SUTURE) ×1 IMPLANT
TIP RUMI ORANGE 6.7MMX12CM (TIP) IMPLANT
TIP UTERINE 5.1X6CM LAV DISP (MISCELLANEOUS) IMPLANT
TIP UTERINE 6.7X10CM GRN DISP (MISCELLANEOUS) IMPLANT
TIP UTERINE 6.7X6CM WHT DISP (MISCELLANEOUS) IMPLANT
TIP UTERINE 6.7X8CM BLUE DISP (MISCELLANEOUS) ×2 IMPLANT
TOWEL OR 17X26 10 PK STRL BLUE (TOWEL DISPOSABLE) ×2 IMPLANT
TRAY FOLEY W/BAG SLVR 14FR LF (SET/KITS/TRAYS/PACK) ×2 IMPLANT
TRENDGUARD 450 HYBRID PRO PACK (MISCELLANEOUS) ×2
TROCAR PORT AIRSEAL 5X120 (TROCAR) ×2 IMPLANT

## 2019-11-17 NOTE — Progress Notes (Signed)
Dr. Smith Robert and Dr. Pamala Hurry at the bedside to discuss pain management due to long term use of Depade. Orders received for mulitmodal pain management. Pt urine output low, concentrated urine noted in cath bag, order given for Albumin. Will continue to monitor and manage. Pt anxious and deep breathing and whimpering, Versed 1mg  given IV.

## 2019-11-17 NOTE — Transfer of Care (Signed)
Immediate Anesthesia Transfer of Care Note  Patient: Charlene [name]  Procedure(s) Performed: XI ROBOTIC ASSISTED LAPAROSCOPIC HYSTERECTOMY AND BILATERAL SALPINGECTOMY (Bilateral )  Patient Location: PACU  Anesthesia Type:General  Level of Consciousness: awake, alert , oriented and patient cooperative  Airway & Oxygen Therapy: Patient Spontanous Breathing and Patient connected to nasal cannula oxygen  Post-op Assessment: Report given to RN and Post -op Vital signs reviewed and stable  Post vital signs: Reviewed and stable  Last Vitals:  Vitals Value Taken Time  BP 120/58 11/17/19 1600  Temp 36.4 C 11/17/19 1556  Pulse 92 11/17/19 1603  Resp 16 11/17/19 1603  SpO2 100 % 11/17/19 1603  Vitals shown include unvalidated device data.  Last Pain:  Vitals:   11/17/19 1556  TempSrc:   PainSc: 7       Patients Stated Pain Goal: 5 (18/86/77 3736)  Complications: No complications documented.

## 2019-11-17 NOTE — Anesthesia Procedure Notes (Signed)
Procedure Name: Intubation Date/Time: 11/17/2019 12:59 PM Performed by: Rogers Blocker, CRNA Pre-anesthesia Checklist: Patient identified, Emergency Drugs available, Suction available and Patient being monitored Patient Re-evaluated:Patient Re-evaluated prior to induction Oxygen Delivery Method: Circle System Utilized Preoxygenation: Pre-oxygenation with 100% oxygen Induction Type: IV induction Ventilation: Mask ventilation without difficulty Laryngoscope Size: Mac and 3 Grade View: Grade I Tube type: Oral Number of attempts: 1 Airway Equipment and Method: Stylet Placement Confirmation: ETT inserted through vocal cords under direct vision,  positive ETCO2 and breath sounds checked- equal and bilateral Secured at: 22 cm Tube secured with: Tape Dental Injury: Teeth and Oropharynx as per pre-operative assessment

## 2019-11-17 NOTE — Anesthesia Postprocedure Evaluation (Addendum)
Anesthesia Post Note  Patient: Charlene [name]  Procedure(s) Performed: XI ROBOTIC ASSISTED LAPAROSCOPIC HYSTERECTOMY AND BILATERAL SALPINGECTOMY (Bilateral )     Patient location during evaluation: PACU Anesthesia Type: General Level of consciousness: awake and alert Pain management: satisfactory to patient Vital Signs Assessment: post-procedure vital signs reviewed and stable Respiratory status: spontaneous breathing, nonlabored ventilation, respiratory function stable and patient connected to nasal cannula oxygen Cardiovascular status: blood pressure returned to baseline and stable Postop Assessment: no apparent nausea or vomiting Anesthetic complications: no Comments: Discussed pain mgmt with MD and PACU RN. Multimodal therapy will be implemented and continued throughout the night. Also discussed plan with patient. She understands her current home medication naltrexone may make our opioid medications less effective.     No complications documented.  Last Vitals:  Vitals:   11/17/19 1600 11/17/19 1615  BP: (!) 120/58 128/82  Pulse: 93 98  Resp: (!) 24 18  Temp:    SpO2: 100% 98%    Last Pain:  Vitals:   11/17/19 1556  TempSrc:   PainSc: [name]

## 2019-11-17 NOTE — Op Note (Signed)
11/17/2019  4:08 PM  PATIENT:  Charlene [name]  49 y.o. female  PRE-OPERATIVE DIAGNOSIS:  Menorrhagia, obesity, iron deficiency anemia  POST-OPERATIVE DIAGNOSIS:  Menorrhagia, obesity, iron deficiency anemia  PROCEDURE:  Procedure(s): XI ROBOTIC ASSISTED LAPAROSCOPIC HYSTERECTOMY AND BILATERAL SALPINGECTOMY (Bilateral)  SURGEON:  Surgeon(s) and Role:    * Aloha Gell, MD - Primary    * Law, Ezra A, DO - Assisting  PHYSICIAN ASSISTANT:   ASSISTANTS: As above  ANESTHESIA:   local and general  EBL:  200 mL   BLOOD ADMINISTERED:none  DRAINS: Urinary Catheter (Foley)   LOCAL MEDICATIONS USED:  MARCAINE    and BUPIVICAINE   SPECIMEN:  Source of Specimen:  Uterus cervix bilateral fallopian tubes  DISPOSITION OF SPECIMEN:  PATHOLOGY  COUNTS:  YES  TOURNIQUET:  * No tourniquets in log *  DICTATION: .Note written in EPIC  PLAN OF CARE: Admit for overnight observation  PATIENT DISPOSITION:  PACU - hemodynamically stable.   Delay start of Pharmacological VTE agent (>24hrs) due to surgical blood loss or risk of bleeding: yes    Procedure:Robotic-assisted total laparoscopic hysterectomy with bilateral salpingectomy and ovarian retention Complications: none Antibiotics: 2 g Ancef Findings:  nl uterus, b/l ovaries and tubes, unable to visualize upper abdomen due to large quantity of thick omental adhesions in the upper abdomen, Hemostasis post-procedure/  Indications: This is a 49 year old G2 P2 nonpregnant patient with persistent mental menorrhagia and anemia despite IV iron and multiple attempts at hormonal control.  Decision was made for definitive hysterectomy due to her bleeding..   Procedure: After informed consent obtained, the patient was taken to the operating room where general anesthesia was initiated without difficulty.   She was prepped and draped in normal sterile fashion in the dorsal supine lithotomy position.  Surgical tape was used to elevate  the pannus cephalad.  A Foley catheter was inserted sterilely into the bladder. A bimanual examination was done to assess the size and position of the uterus. A weighted speculum was placed in the vagina and deaver retractors were used on the anterior vaginal wall. .The cervix was grasped with tenaculum. Cervix was dilated and sounded. The cervix was assessed to identify the Rumi-Co size.  A medium cup and an 8 cm shaft was used. The uterine balloon was inflated.  Gloved were changed. Attention was then turned to the patient's abdomen. 0.5 % marcaine was used prior to all incision. A total of 10 cc of marcaine was used.  A 10 mm incision was made in the umbilicus, a Veress needle was used to enter the peritoneal cavity and after ensuring intraperitoneal placement with saline filled syringe pneumoperitoneum was created to 15 mmHg.  A nonbladed Optiview trocar was then used to enter into the peritoneal cavity.   Intraperitoneal placement was confirmed with the use of the camera and pneumoperitoneum was created to 15 mm of mercury.  Brief survey of the abdomen and pelvis was done with findings as above.  We were several centimeters below the adhesions in the upper abdomen.  The abdominal wall was assessed and additional port sites were marked. 8 mm incisions were placed in the right (two) and left lower quadrants (2) and non-bladed ports were placed under direct visualization. The robot was brought to the patient's side and attached with the right side docking. The robotic instruments were placed under direct visualization until proper placement just over the uterus.   I then went to the robotic console. Brief survey of the patient's abdomen and pelvis  revealed  findings as above. Bilateral tubes and ovaries were normal.  No significant adhesions in the lower pelvis  were noted.  I began on the left side: the  Left ureter and  IP were identified.  The ureter was deep in the pelvis and difficult to see due to the  adiposity.  The left fallopian tube was serially cauterized and transected along the mesosalpynx with the vessel sealer. The left round ligament was cauterized and divided.  The left uterine-ovarian ligament was sealed and transected. The anterior leaf of the broad ligament was dissected bluntly then monopolar cautery was used to separate the anterior and posterior leafs of the broad ligament. This was carried down through to the bladder flap. Attention was then turned to the patient's  Right side: The right IP and ureter were identified again ureters deep in the pelvis and difficult to see due to adiposity.  The right fallopian tube was serially cauterized and transected along the mesosalpynx.  The right round  ligament was cauterized and transected. The right uterine-ovarian ligament was sealed and cauterized. The anterior and posterior leaves of the broad ligament were bluntly and sharply dissected apart from each other. Monopolar cautery was used to come across the anterior leaf of the right broad ligament. This dissection was carried down across to the midline to create the bladder flap. The left uterine artery was serially cauterized with the vessel sealer and transected.  The right uterine artery and then serially cauterized with the vessel sealer and transected. The uterus was placed on stretch to allow better visualization of the arteries. The bladder flap was further taken down both sharply and with cautery. Cautery was used for hemostasis on several places along the bladder flap. At this point with the pressure inward on the Rumi, the anterior colpotomy was made with the monopolar scissors. This was carried around to the patient's right side. Additional bipolar cautery was used on the  both angles of the cuff- this was carried out with the vessel sealer. The uterus was then positioned  anteriorly  to allow easy access to the posterior  colpotomy. This was carried out with the monopolar scissors.  Good  hemostasis was noted along the angles of the incision.  With manipulation of the specimen, the uterus, cervix, and bilateral tubes were pulled out through the vagina.   Irrigation was used and the vaginal cuff appeared hemostatic. A 9 inch V-lock suture was placed though the left sided robotic port. Instruments were changed to allow a suture cut needle driver through the #3 port and and a long-tipped forceps through the #1 port. The right angle was closed and locked with the V-lock suture. Running suture was carried out tothe L angle.  A more superficial layer of suture was placed travelling back to the right angle. The suture was cut and removed. Excellent hemostasis was noted. Suction irrigation was carried out and hemostasis was assured along the vaginal cuff.  Small bit of oozing was noted from the bladder flap and the right broad ligament pedicle.  No pumping vessels were seen that could be cauterized.  Free fluid was removed.  Arista was applied with excellent effect.  The pedicles were reassessed also found to be hemostatic.  The robotic portion was completed. The robot was undocked.   By standard laparoscopy  cuff was reinspected and  found to be hemostatic. All pedicles were also found to be hemostatic. 20 CC 1/4% bupivicaine was placed into the peritoneal cavity. the ports were  removed. Pneumoperitoneum was released and the umbilical port was removed.  No fascial incisions were closed due to the small size, nonbladed entry,  and the nondilated nature of these incisions. A 4-0 Vicryl was used to close the laparoscopic ports sites. Good hemostasis was noted.  Sponge lap and needle counts were correct x3 the patient was woken from general anesthesia having tolerated the procedure well and taken to the recovery room in a stable fashion.

## 2019-11-17 NOTE — Brief Op Note (Signed)
11/17/2019  4:08 PM  PATIENT:  Charlene [name]  49 y.o. female  PRE-OPERATIVE DIAGNOSIS:  Menorrhagia, obesity, iron deficiency anemia  POST-OPERATIVE DIAGNOSIS:  Menorrhagia, obesity, iron deficiency anemia  PROCEDURE:  Procedure(s): XI ROBOTIC ASSISTED LAPAROSCOPIC HYSTERECTOMY AND BILATERAL SALPINGECTOMY (Bilateral)  SURGEON:  Surgeon(s) and Role:    * Aloha Gell, MD - Primary    * Law, Akshaya A, DO - Assisting  PHYSICIAN ASSISTANT:   ASSISTANTS: As above  ANESTHESIA:   local and general  EBL:  200 mL   BLOOD ADMINISTERED:none  DRAINS: Urinary Catheter (Foley)   LOCAL MEDICATIONS USED:  MARCAINE    and BUPIVICAINE   SPECIMEN:  Source of Specimen:  Uterus cervix bilateral fallopian tubes  DISPOSITION OF SPECIMEN:  PATHOLOGY  COUNTS:  YES  TOURNIQUET:  * No tourniquets in log *  DICTATION: .Note written in EPIC  PLAN OF CARE: Admit for overnight observation  PATIENT DISPOSITION:  PACU - hemodynamically stable.   Delay start of Pharmacological VTE agent (>24hrs) due to surgical blood loss or risk of bleeding: yes

## 2019-11-17 NOTE — Anesthesia Preprocedure Evaluation (Addendum)
Anesthesia Evaluation  Patient identified by MRN, date of birth, ID band Patient awake    Reviewed: Allergy & Precautions, NPO status , Patient's Chart, lab work & pertinent test results  Airway Mallampati: I  TM Distance: >3 FB Neck ROM: Full    Dental  (+) Teeth Intact, Dental Advisory Given   Pulmonary neg pulmonary ROS,    breath sounds clear to auscultation       Cardiovascular hypertension, Pt. on medications  Rhythm:Regular Rate:Normal     Neuro/Psych negative neurological ROS  negative psych ROS   GI/Hepatic negative GI ROS, Neg liver ROS,   Endo/Other  Hypothyroidism   Renal/GU negative Renal ROS     Musculoskeletal negative musculoskeletal ROS (+)   Abdominal (+) + obese,   Peds  Hematology   Anesthesia Other Findings   Reproductive/Obstetrics                            Anesthesia Physical Anesthesia Plan  ASA: III  Anesthesia Plan: General   Post-op Pain Management:    Induction: Intravenous  PONV Risk Score and Plan: 4 or greater and Ondansetron, Dexamethasone, Midazolam and Scopolamine patch - Pre-op  Airway Management Planned: Oral ETT  Additional Equipment: None  Intra-op Plan:   Post-operative Plan: Extubation in OR  Informed Consent: I have reviewed the patients History and Physical, chart, labs and discussed the procedure including the risks, benefits and alternatives for the proposed anesthesia with the patient or authorized representative who has indicated his/her understanding and acceptance.     Dental advisory given  Plan Discussed with: CRNA  Anesthesia Plan Comments: (Covid-19 Nucleic Acid Test Results Lab Results      Component                Value               Date                      SARSCOV2NAA              NEGATIVE            11/13/2019                [place]              Not Detected        11/05/2018            Lab Results       Component                Value               Date                      WBC                      5.8                 11/07/2019                HGB                      12.7                11/07/2019                HCT  37.2                11/07/2019                MCV                      86.7                11/07/2019                PLT                      344                 11/07/2019           )       Anesthesia Quick Evaluation

## 2019-11-17 NOTE — H&P (Signed)
Chief complaint: Mental menorrhagia, iron deficiency anemia,  History of present illness 49 year old G3 P2-0-1-2 with longstanding history of mental menorrhagia.  Patient also with iron deficiency anemia for which she needs frequent IV iron transfusions and history of blood transfusions.  Patient has hypertension and unable to use estrogen-based contraceptives.  Patient has expelled 3 separate IUDs.  Patient currently has poor control of her menorrhagia on norethindrone 3/day.  Patient is up-to-date with normal Paps July 2018 and July 2021.  Patient has had negative STD screening July 2021.  Patient has had normal endometrial biopsy March 2021 and repeat November 2020.  Patient has had a normal sonohysterogram with no focal endometrial findings.  Patient's last ultrasound was November 2020 uterus was measuring 9 x 6 x 5 with a thin endometrial stripe.  Patient received preop medical clearance from her primary care Dr. Alessandra Grout who felt she was safe to proceed with surgery with no additional testing needed.  Her primary care doctor was not concerned about a diagnosis of sleep apnea when specifically asked.  Past Medical History:  Diagnosis Date  . Hypertension   . Lactating mother 07/13/2010  . No pertinent past medical history   Depression Iron deficiency anemia Sickle cell trait  Past Surgical History:  Procedure Laterality Date  . GASTRIC BYPASS  2000  . HERNIA REPAIR  2004  . VENTRAL HERNIA REPAIR  2004  Patient notes history of postop hernia after her gastric bypass which then had infected mesh for which she needed additional treatment  Allergies: Penicillin, lisinopril Medications: Lamictal, amlodipine, Wellbutrin, hydrochlorothiazide  PE: Vitals:   11/17/19 1137  BP: (!) 152/97  Pulse: 79  Resp: 18  Temp: 98.8 F (37.1 C)  SpO2: 98%      CBC    Component Value Date/Time   WBC 5.8 11/07/2019 1348   RBC 4.29 11/07/2019 1348   HGB 12.7 11/07/2019 1348   HGB 11.9  10/08/2017 0838   HGB 12.5 11/09/2016 0834   HCT 37.2 11/07/2019 1348   HCT 37.1 11/09/2016 0834   PLT 344 11/07/2019 1348   PLT 238 10/08/2017 0838   PLT 214 11/09/2016 0834   MCV 86.7 11/07/2019 1348   MCV 83.9 11/09/2016 0834   MCH 29.6 11/07/2019 1348   MCHC 34.1 11/07/2019 1348   RDW 14.4 11/07/2019 1348   RDW 18.9 (H) 11/09/2016 0834   LYMPHSABS 1.9 10/14/2019 0919   LYMPHSABS 1.6 11/09/2016 0834   MONOABS 0.7 10/14/2019 0919   MONOABS 0.6 11/09/2016 0834   EOSABS 0.2 10/14/2019 0919   EOSABS 0.3 11/09/2016 0834   BASOSABS 0.1 10/14/2019 0919   BASOSABS 0.1 11/09/2016 0834    CMP     Component Value Date/Time   NA 140 11/07/2019 1348   NA 139 11/09/2016 0834   K 3.6 11/07/2019 1348   K 3.9 11/09/2016 0834   CL 106 11/07/2019 1348   CO2 22 11/07/2019 1348   CO2 22 11/09/2016 0834   GLUCOSE 74 11/07/2019 1348   GLUCOSE 91 11/09/2016 0834   BUN 8 11/07/2019 1348   BUN 8.4 11/09/2016 0834   CREATININE 0.76 11/07/2019 1348   CREATININE 0.78 10/08/2017 0838   CREATININE 0.8 11/09/2016 0834   CALCIUM 8.1 (L) 11/07/2019 1348   CALCIUM 8.7 11/09/2016 0834   PROT 7.6 10/08/2017 0838   PROT 7.4 11/09/2016 0834   ALBUMIN 3.8 10/08/2017 0838   ALBUMIN 3.8 11/09/2016 0834   AST 25 10/08/2017 0838   AST 30 11/09/2016 0834  ALT 21 10/08/2017 0838   ALT 32 11/09/2016 0834   ALKPHOS 66 10/08/2017 0838   ALKPHOS 69 11/09/2016 0834   BILITOT 0.4 10/08/2017 0838   BILITOT 0.43 11/09/2016 0834   GFRNONAA >60 11/07/2019 1348   GFRNONAA >60 10/08/2017 0838   GFRAA >60 10/08/2017 9767    Assessment plan: 49 year old G3, P2 with persistent iron deficiency anemia and menorrhagia for definitive management of her uterine bleeding.  Risk benefits discussed with patient including anesthesia complications, bleeding, infection, postoperative hernia.  Patient aware surgery is likely be more difficult given her size and her prior abdominal surgeries.  Plan robotic approach.  Ala Dach 11/17/2019 12:03 PM

## 2019-11-18 ENCOUNTER — Encounter (HOSPITAL_BASED_OUTPATIENT_CLINIC_OR_DEPARTMENT_OTHER): Payer: Self-pay | Admitting: Obstetrics

## 2019-11-18 DIAGNOSIS — N920 Excessive and frequent menstruation with regular cycle: Secondary | ICD-10-CM | POA: Diagnosis not present

## 2019-11-18 LAB — CBC
HCT: 34.5 % — ABNORMAL LOW (ref 36.0–46.0)
Hemoglobin: 11.7 g/dL — ABNORMAL LOW (ref 12.0–15.0)
MCH: 29.5 pg (ref 26.0–34.0)
MCHC: 33.9 g/dL (ref 30.0–36.0)
MCV: 86.9 fL (ref 80.0–100.0)
Platelets: 258 10*3/uL (ref 150–400)
RBC: 3.97 MIL/uL (ref 3.87–5.11)
RDW: 14.5 % (ref 11.5–15.5)
WBC: 10.9 10*3/uL — ABNORMAL HIGH (ref 4.0–10.5)
nRBC: 0 % (ref 0.0–0.2)

## 2019-11-18 MED ORDER — ACETAMINOPHEN 500 MG PO TABS
1000.0000 mg | ORAL_TABLET | Freq: Four times a day (QID) | ORAL | 1 refills | Status: DC | PRN
Start: 2019-11-18 — End: 2023-10-31

## 2019-11-18 MED ORDER — OXYCODONE HCL 5 MG PO TABS
ORAL_TABLET | ORAL | Status: AC
  Filled 2019-11-18: qty 2

## 2019-11-18 MED ORDER — ACETAMINOPHEN 500 MG PO TABS
ORAL_TABLET | ORAL | Status: AC
  Filled 2019-11-18: qty 2

## 2019-11-18 MED ORDER — KETOROLAC TROMETHAMINE 30 MG/ML IJ SOLN
INTRAMUSCULAR | Status: AC
  Filled 2019-11-18: qty 1

## 2019-11-18 MED ORDER — IBUPROFEN 800 MG PO TABS: 800.0000 mg | ORAL_TABLET | Freq: Three times a day (TID) | ORAL | 1 refills | Status: DC | PRN

## 2019-11-18 MED ORDER — OXYCODONE HCL 5 MG PO TABS
5.0000 mg | ORAL_TABLET | ORAL | 0 refills | Status: AC | PRN
Start: 2019-11-18 — End: 2019-11-23

## 2019-11-18 NOTE — Discharge Summary (Signed)
Charlene [name] MRN: [id number] DOB/AGE: 1970-10-26 49 y.o.  Admit date: 11/17/2019 Discharge date: November 18, 2019  Admission Diagnoses: Menorrhagia [N92.0] S/P hysterectomy [Z90.710]  Discharge Diagnoses: Menorrhagia [N92.0] S/P hysterectomy [Z90.710]        Active Problems:   Menorrhagia   S/P hysterectomy   Discharged Condition: stable  Hospital Course: Patient admitted for planned robotic assisted total laparoscopic hysterectomy with bilateral salpingectomy and ovarian retention for menorrhagia and iron deficiency anemia. Details of that surgery can be found in a separate dictation. Initially postoperatively patient had some issues with pain control. At that time it was brought to my attention patient had been a daily naltrexone user for history of alcohol abuse. Despite taking care of this patient for several years I was unaware of this diagnosis as it was never shared. Anesthesia consulted with pain management. Patient was placed on Toradol, Tylenol, IV Dilaudid for the first several hours postoperatively then switched to 10 mg of oxycodone every 4 hours. Overnight patient did well with adequate pain control but poor sleep. Patient tolerated regular p.o. though appetite was decreased. No emesis. Patient did walk in the evening but did not walk again through the night. Foley catheter was removed at 5 AM and we are still awaiting a void.  On postoperative day 1 history as above  Vitals with BMI 11/18/2019 11/18/2019 11/17/2019  Height - - -  Weight - - -  BMI - - -  Systolic 220 254 270  Diastolic 69 76 68  Pulse 81 88 100   General: Morbidly obese, no distress, pleasant Cardiovascular: Regular rate and rhythm Pulmonary clear to auscultation bilaterally Abdomen obese, appropriately tender, nondistended, no tympany Incision laparoscopic port sites healing well GU deferred Lower extremity: Nontender, no edema  CBC    Component Value Date/Time   WBC 10.9 (H) 11/18/2019  0606   RBC 3.97 11/18/2019 0606   HGB 11.7 (L) 11/18/2019 0606   HGB 11.9 10/08/2017 0838   HGB 12.5 11/09/2016 0834   HCT 34.5 (L) 11/18/2019 0606   HCT 37.1 11/09/2016 0834   PLT 258 11/18/2019 0606   PLT 238 10/08/2017 0838   PLT 214 11/09/2016 0834   MCV 86.9 11/18/2019 0606   MCV 83.9 11/09/2016 0834   MCH 29.5 11/18/2019 0606   MCHC 33.9 11/18/2019 0606   RDW 14.5 11/18/2019 0606   RDW 18.9 (H) 11/09/2016 0834   LYMPHSABS 1.9 10/14/2019 0919   LYMPHSABS 1.6 11/09/2016 0834   MONOABS 0.7 10/14/2019 0919   MONOABS 0.6 11/09/2016 0834   EOSABS 0.2 10/14/2019 0919   EOSABS 0.3 11/09/2016 0834   BASOSABS 0.1 10/14/2019 0919   BASOSABS 0.1 11/09/2016 0834     Consults: None  Treatments: surgery: Robotic assisted total laparoscopic hysterectomy with bilateral salpingectomy  Disposition: Discharge disposition: 01-Home or Self Care        Allergies as of 11/18/2019      Reactions   Penicillins Hives, Shortness Of Breath   Heart rate increases   Shellfish Allergy    Swells up face      Medication List    STOP taking these medications   naltrexone 50 MG tablet Commonly known as: DEPADE   norethindrone 5 MG tablet Commonly known as: AYGESTIN     TAKE these medications   acetaminophen 500 MG tablet Commonly known as: TYLENOL Take 2 tablets (1,000 mg total) by mouth every 6 (six) hours as needed.   amLODipine 5 MG tablet Commonly known as: NORVASC Take 5 mg by  mouth daily.   buPROPion 75 MG tablet Commonly known as: WELLBUTRIN Take 150 mg by mouth daily.   cholecalciferol 25 MCG (1000 UNIT) tablet Commonly known as: VITAMIN D3 Take 1,000 Units by mouth daily.   cyanocobalamin 1000 MCG/ML injection Commonly known as: (VITAMIN B-12) Inject 1,000 mcg into the muscle every 30 (thirty) days.   hydrochlorothiazide 12.5 MG capsule Commonly known as: MICROZIDE Take 12.5 mg by mouth daily.   ibuprofen 800 MG tablet Commonly known as: ADVIL Take 1  tablet (800 mg total) by mouth every 8 (eight) hours as needed for moderate pain.   lamoTRIgine 150 MG tablet Commonly known as: LAMICTAL Take 150 mg by mouth daily.   levothyroxine 75 MCG tablet Commonly known as: SYNTHROID Take 75 mcg by mouth daily before breakfast.   oxyCODONE 5 MG immediate release tablet Commonly known as: Oxy IR/ROXICODONE Take 1-2 tablets (5-10 mg total) by mouth every 4 (four) hours as needed for up to 5 days for moderate pain.   Potassium 99 MG Tabs Take 99 mg by mouth every other day.   traZODone 50 MG tablet Commonly known as: DESYREL Take 50-100 mg by mouth at bedtime as needed.   Vitamin D (Ergocalciferol) 1.25 MG (50000 UNIT) Caps capsule Commonly known as: DRISDOL Take 1 capsule once a week by mouth.        Signed: Ala Dach, MD 11/18/2019, 8:13 AM

## 2019-11-18 NOTE — Discharge Instructions (Signed)

## 2019-11-19 LAB — SURGICAL PATHOLOGY

## 2019-12-09 ENCOUNTER — Other Ambulatory Visit: Payer: Self-pay

## 2019-12-09 ENCOUNTER — Inpatient Hospital Stay: Payer: BC Managed Care – PPO | Attending: Oncology

## 2019-12-09 DIAGNOSIS — D500 Iron deficiency anemia secondary to blood loss (chronic): Secondary | ICD-10-CM | POA: Insufficient documentation

## 2019-12-09 DIAGNOSIS — D508 Other iron deficiency anemias: Secondary | ICD-10-CM

## 2019-12-09 LAB — CBC WITH DIFFERENTIAL/PLATELET
Abs Immature Granulocytes: 0.03 10*3/uL (ref 0.00–0.07)
Basophils Absolute: 0.1 10*3/uL (ref 0.0–0.1)
Basophils Relative: 1 %
Eosinophils Absolute: 0.2 10*3/uL (ref 0.0–0.5)
Eosinophils Relative: 4 %
HCT: 37.3 % (ref 36.0–46.0)
Hemoglobin: 13 g/dL (ref 12.0–15.0)
Immature Granulocytes: 1 %
Lymphocytes Relative: 34 %
Lymphs Abs: 1.9 10*3/uL (ref 0.7–4.0)
MCH: 28.8 pg (ref 26.0–34.0)
MCHC: 34.9 g/dL (ref 30.0–36.0)
MCV: 82.7 fL (ref 80.0–100.0)
Monocytes Absolute: 0.6 10*3/uL (ref 0.1–1.0)
Monocytes Relative: 11 %
Neutro Abs: 2.7 10*3/uL (ref 1.7–7.7)
Neutrophils Relative %: 49 %
Platelets: 302 10*3/uL (ref 150–400)
RBC: 4.51 MIL/uL (ref 3.87–5.11)
RDW: 13.8 % (ref 11.5–15.5)
WBC: 5.5 10*3/uL (ref 4.0–10.5)
nRBC: 0 % (ref 0.0–0.2)

## 2019-12-09 LAB — IRON AND TIBC
Iron: 111 ug/dL (ref 41–142)
Saturation Ratios: 24 % (ref 21–57)
TIBC: 468 ug/dL — ABNORMAL HIGH (ref 236–444)
UIBC: 357 ug/dL (ref 120–384)

## 2019-12-09 LAB — RETICULOCYTES
Immature Retic Fract: 19.3 % — ABNORMAL HIGH (ref 2.3–15.9)
RBC.: 4.52 MIL/uL (ref 3.87–5.11)
Retic Count, Absolute: 95.4 10*3/uL (ref 19.0–186.0)
Retic Ct Pct: 2.1 % (ref 0.4–3.1)

## 2019-12-09 LAB — FERRITIN: Ferritin: 43 ng/mL (ref 11–307)

## 2019-12-11 ENCOUNTER — Other Ambulatory Visit: Payer: BC Managed Care – PPO

## 2019-12-17 NOTE — Progress Notes (Signed)
[place]  [phone] [phone]   ID: Charlene [name]   DOB: 1970/06/29  MR#: [id number]  JYN#:[id number]  Patient Care Team: Charlene Jordan, MD as PCP - General (Family Medicine) OTHER MD:  I connected with Charlene [name] on 12/18/19 at 10:00 AM EST by telephone visit and verified that I am speaking with the correct person using two identifiers.   I discussed the limitations, risks, security and privacy concerns of performing an evaluation and management service by telemedicine and the availability of in-person appointments. I also discussed with the patient that there may be a patient responsible charge related to this service. The patient expressed understanding and agreed to proceed.   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 She did not answer her phone and she did not connect to a video visit. I left her a voicemail  Since her last visit, she underwent hysterectomy and salpingectomy on 11/17/2019 under Dr. Pamala [name]. Pathology from the procedure ([id number]) was benign.   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  She became significantly anemic and was receiving intravenous iron under Dr. Rockney [name] in [place].  She has now moved to this area and would like to continue to receive the intravenous iron on an as needed basis.    PAST MEDICAL HISTORY: Past Medical History:  Diagnosis Date  . Hypertension   . Lactating mother 07/13/2010  . No pertinent past medical history     PAST SURGICAL HISTORY: Past Surgical History:  Procedure Laterality Date  . GASTRIC BYPASS  2000  . HERNIA REPAIR  2004  . ROBOTIC ASSISTED LAPAROSCOPIC HYSTERECTOMY AND SALPINGECTOMY Bilateral 11/17/2019   Procedure: XI ROBOTIC ASSISTED LAPAROSCOPIC HYSTERECTOMY AND BILATERAL SALPINGECTOMY;  Surgeon: Charlene Gell, MD;  Location: [facility];  Service: Gynecology;  Laterality: Bilateral;  . VENTRAL HERNIA REPAIR  2004  Aside from the gastric bypass, is only significant for herniorrhaphy and tonsillectomy and adenoidectomy.    FAMILY HISTORY Family History  Problem Relation Age of Onset  . Hypertension Mother   . Heart disease Father   . Hypertension Father   The patient's parents are living.  The patient has 3 brothers and 1 sister.  The sister had a stroke at age 20 in the setting of drug abuse.  Otherwise, the family is doing well.  There is no history of bleeding or clotting problems in the family.    GYNECOLOGIC HISTORY: She is G2, F1, P0, A1, L1.  Periods were regular and heavy; now s/p hysterectomy (ovaries in place) 11/2019.   SOCIAL HISTORY: (Updated 10/14/18/21)  She is working remotely for [employer] in [place].  She is separated from her husband Charlene [name].  She is currently going to school for a doctoral degree in business administration. Her son Charlene [name] is 84 and Facilities manager at [employer]. She  has her niece Charlene [name] who is 25 living with her along with Charlene [name], her daughter who is 67 years old.     ADVANCED DIRECTIVES: not in place   HEALTH MAINTENANCE: Social History   Tobacco Use  . Smoking [name]: Never Smoker  . Smokeless tobacco: Never Used  Vaping Use  .  Vaping Use: Never used  Substance Use Topics  . Alcohol use: Yes    Comment: social drinker  . Drug use: No     Colonoscopy: 2016  PAP: 2020  Bone density: n/a  Lipid panel: 2021  Allergies  Allergen Reactions  . Penicillins Hives and Shortness Of Breath    Heart rate increases  . Shellfish Allergy     Swells up face    Current Outpatient Medications  Medication Sig Dispense Refill  . acetaminophen (TYLENOL) 500 MG tablet Take 2 tablets (1,000 mg total) by mouth every 6 (six) hours as needed. 60 tablet 1  . amLODipine (NORVASC) 5 MG tablet Take 5 mg by mouth daily.    Marland Kitchen buPROPion (WELLBUTRIN) 75 MG tablet Take 150 mg by mouth daily.    . cholecalciferol (VITAMIN D3) 25 MCG (1000 UNIT) tablet Take 1,000 Units by mouth daily.    . cyanocobalamin (,VITAMIN B-12,) 1000 MCG/ML injection Inject 1,000 mcg into the muscle every 30 (thirty) days.     . hydrochlorothiazide (MICROZIDE) 12.5 MG capsule Take 12.5 mg by mouth daily.    Marland Kitchen ibuprofen (ADVIL) 800 MG tablet Take 1 tablet (800 mg total) by mouth every 8 (eight) hours as needed for moderate pain. 60 tablet 1  . lamoTRIgine (LAMICTAL) 150 MG tablet Take 150 mg by mouth daily.   2  . levothyroxine (SYNTHROID) 75 MCG tablet Take 75 mcg by mouth daily before breakfast.     . Potassium 99 MG TABS Take 99 mg by mouth every other day.    . traZODone (DESYREL) 50 MG tablet Take 50-100 mg by mouth at bedtime as needed.     . Vitamin D, Ergocalciferol, (DRISDOL) 50000 units CAPS capsule Take 1 capsule once a week by mouth.  (Patient not taking: Reported on 11/06/2019)     No current facility-administered medications for this visit.    OBJECTIVE:   There were no vitals filed for this visit. Wt Readings from Last 3 Encounters:  11/17/19 (!) 313 lb 3.2 oz (142.1 kg)  11/07/19 (!) 317 lb (143.8 kg)  10/14/19 (!) 315 lb 1.6 oz (142.9 kg)   There is no height or weight on file to calculate BMI.    ECOG FS:1 - Symptomatic but completely  ambulatory  Telemedicine visit 12/18/2019    LAB RESULTS: Lab Results  Component Value Date   WBC 5.5 12/09/2019   NEUTROABS 2.7 12/09/2019   HGB 13.0 12/09/2019   HCT 37.3 12/09/2019   MCV 82.7 12/09/2019   PLT 302 12/09/2019      Chemistry      Component Value Date/Time   NA 140 11/07/2019 1348   NA 139 11/09/2016 0834   K 3.6 11/07/2019 1348   K 3.9 11/09/2016 0834   CL 106 11/07/2019 1348   CO2 22 11/07/2019 1348   CO2 22 11/09/2016 0834   BUN 8 11/07/2019 1348   BUN 8.4 11/09/2016 0834   CREATININE 0.76 11/07/2019 1348   CREATININE 0.78 10/08/2017 0838   CREATININE 0.8 11/09/2016 0834      Component Value Date/Time   CALCIUM 8.1 (L) 11/07/2019 1348   CALCIUM 8.7 11/09/2016 0834  ALKPHOS 66 10/08/2017 0838   ALKPHOS 69 11/09/2016 0834   AST 25 10/08/2017 0838   AST 30 11/09/2016 0834   ALT 21 10/08/2017 0838   ALT 32 11/09/2016 0834   BILITOT 0.4 10/08/2017 0838   BILITOT 0.43 11/09/2016 0834      No results found for: LABCA2  No components found for: [id number]  No results for input(s): INR in the last 168 hours.  Urinalysis    Component Value Date/Time   COLORURINE YELLOW 09/25/2012 1320   APPEARANCEUR CLEAR 09/25/2012 1320   LABSPEC 1.016 09/25/2012 1320   PHURINE 6.0 09/25/2012 1320   GLUCOSEU NEGATIVE 09/25/2012 1320   HGBUR NEGATIVE 09/25/2012 1320   BILIRUBINUR NEGATIVE 09/25/2012 1320   KETONESUR NEGATIVE 09/25/2012 1320   PROTEINUR NEGATIVE 09/25/2012 1320   UROBILINOGEN 0.2 09/25/2012 1320   NITRITE NEGATIVE 09/25/2012 1320   LEUKOCYTESUR NEGATIVE 09/25/2012 1320    STUDIES: No results found.   ASSESSMENT: 49 y.o. 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    (a) Feraheme treatment 09/01/2016 and 09/08/2016  (b) additional Feraheme 06/26/2017 on 07/18/2017.  (c) Feraheme 10/15/2017 and 06/14/2018  (d) Feraheme  10/14/2018 and 10/21/2018  (e) Feraheme 05/22/2019 and 05/29/2019  (1) [name] post complete hysterectomy with salpingectomy 11/17/2019 with benign pathology   PLAN:  Westlynn's ferritin is in the normal range. Her total iron binding capacity is a little bit high. I think she would benefit from oral iron supplementation as tolerated and I suggested she take 1 tablet daily with food.  Because of her prior gastric bypass this may not be sufficient and I am setting her up for repeat lab work in April and a repeat visit at that time  Sarajane Jews C. Magrinat, MD 12/18/19 10:04 AM Medical Oncology and Hematology [facility] [place], [place] [address] Tel. [phone]    Fax. [phone]   I, Wilburn Mylar, am acting as scribe for Dr. Virgie Dad. Magrinat.  I, Lurline Del MD, have reviewed the above documentation for accuracy and completeness, and I agree with the above.   *Total Encounter Time as defined by the Centers for Medicare and Medicaid Services includes, in addition to the face-to-face time of a patient visit (documented in the note above) non-face-to-face time: obtaining and reviewing outside history, ordering and reviewing medications, tests or procedures, care coordination (communications with other health care professionals or caregivers) and documentation in the medical record.

## 2019-12-18 ENCOUNTER — Inpatient Hospital Stay (HOSPITAL_BASED_OUTPATIENT_CLINIC_OR_DEPARTMENT_OTHER): Payer: BC Managed Care – PPO | Admitting: Oncology

## 2019-12-18 DIAGNOSIS — D508 Other iron deficiency anemias: Secondary | ICD-10-CM

## 2020-03-22 ENCOUNTER — Telehealth: Payer: Self-pay | Admitting: Adult Health

## 2020-03-22 NOTE — Telephone Encounter (Signed)
R/s appts due to providers being on PAL. Pt aware.

## 2020-04-08 ENCOUNTER — Inpatient Hospital Stay: Payer: BC Managed Care – PPO | Attending: Adult Health

## 2020-04-08 ENCOUNTER — Inpatient Hospital Stay (HOSPITAL_BASED_OUTPATIENT_CLINIC_OR_DEPARTMENT_OTHER): Payer: BC Managed Care – PPO | Admitting: Adult Health

## 2020-04-08 DIAGNOSIS — Z5329 Procedure and treatment not carried out because of patient's decision for other reasons: Secondary | ICD-10-CM

## 2020-04-09 ENCOUNTER — Telehealth: Payer: Self-pay | Admitting: Oncology

## 2020-04-09 NOTE — Progress Notes (Signed)
Patient did not show for appointment.  No show letter mailed.

## 2020-04-09 NOTE — Telephone Encounter (Signed)
Rescheduled appts per 4/8 sch msg. Called and spoke with pt, confirmed 5/10 and 5/11 appts

## 2020-04-12 ENCOUNTER — Other Ambulatory Visit: Payer: BC Managed Care – PPO

## 2020-04-14 ENCOUNTER — Ambulatory Visit: Payer: BC Managed Care – PPO | Admitting: Adult Health

## 2020-05-11 ENCOUNTER — Inpatient Hospital Stay: Payer: BC Managed Care – PPO | Attending: Adult Health

## 2020-05-11 NOTE — Progress Notes (Incomplete)
[facility]  [phone] Fax:(336) [phone]   ID: Autie Vasudevan   DOB: 24-Aug-1970  MR#: [id number]  DTO#:[id number]  Patient Care Team: Jonathon Jordan, MD as PCP - General (Family Medicine) OTHER MD:  I connected with [name] on 05/11/20 at  2:00 PM EDT by telephone visit and verified that I am speaking with the correct person using two identifiers.   I discussed the limitations, risks, security and privacy concerns of performing an evaluation and management service by telemedicine and the availability of in-person appointments. I also discussed with the patient that there may be a patient responsible charge related to this service. The patient expressed understanding and agreed to proceed.   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    REVIEW OF SYSTEMS: [name]    COVID 22 VACCINATION STATUS: fully vaccinated Therapist, music)   HISTORY OF PRESENT ILLNESS: From the earlier summary note:  The patient had a gastric bypass for weight control in 2002.  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  She became significantly anemic and was receiving intravenous iron under Dr. Rockney Ghee in [place], [place].  She has now moved to this area and would like to continue to receive the intravenous iron on an as needed basis.    PAST MEDICAL HISTORY: Past Medical History:  Diagnosis Date  . Hypertension   . Lactating mother 07/13/2010  . No  pertinent past medical history     PAST SURGICAL HISTORY: Past Surgical History:  Procedure Laterality Date  . GASTRIC BYPASS  2000  . HERNIA REPAIR  2004  . ROBOTIC ASSISTED LAPAROSCOPIC HYSTERECTOMY AND SALPINGECTOMY Bilateral 11/17/2019   Procedure: XI ROBOTIC ASSISTED LAPAROSCOPIC HYSTERECTOMY AND BILATERAL SALPINGECTOMY;  Surgeon: Aloha Gell, MD;  Location: [facility];  Service: Gynecology;  Laterality: Bilateral;  . VENTRAL HERNIA REPAIR  2004  Aside from the gastric bypass, is only significant for herniorrhaphy and tonsillectomy and adenoidectomy.    FAMILY HISTORY Family History  Problem Relation Age of Onset  . Hypertension Mother   . Heart disease Father   . Hypertension Father   The patient's parents are living.  The patient has 3 brothers and 1 sister.  The sister had a stroke at age 30 in the setting of drug abuse.  Otherwise, the family is doing well.  There is no history of bleeding or clotting problems in the family.    GYNECOLOGIC HISTORY: She is G2, F1, P0, A1, L1.  Periods were regular and heavy; now s/p hysterectomy (ovaries in place) 11/2019.   SOCIAL HISTORY: (Updated 10/14/18/21)  She is working remotely for [employer] in [place].  She is separated from her husband Charlene [name].  She is currently going to school for a doctoral degree in business administration. Her son Charlene [name] is 36 and Facilities manager at [employer]. She has her niece Charlene [name] who is 63 living with her along with Charlene [name], her daughter who is 105 years old.     ADVANCED DIRECTIVES: not in place   HEALTH MAINTENANCE: Social History   Tobacco Use  .  Smoking status: Never Smoker  . Smokeless tobacco: Never Used  Vaping Use  . Vaping Use: Never used  Substance Use Topics  . Alcohol use: Yes    Comment: social drinker  . Drug use: No     Colonoscopy: 2016  PAP: 2020  Bone density: n/a  Lipid panel: 2021  Allergies  Allergen Reactions  . Penicillins Hives and  Shortness Of Breath    Heart rate increases  . Shellfish Allergy     Swells up face    Current Outpatient Medications  Medication Sig Dispense Refill  . acetaminophen (TYLENOL) 500 MG tablet Take 2 tablets (1,000 mg total) by mouth every 6 (six) hours as needed. 60 tablet 1  . amLODipine (NORVASC) 5 MG tablet Take 5 mg by mouth daily.    Marland Kitchen buPROPion (WELLBUTRIN) 75 MG tablet Take 150 mg by mouth daily.    . cholecalciferol (VITAMIN D3) 25 MCG (1000 UNIT) tablet Take 1,000 Units by mouth daily.    . cyanocobalamin (,VITAMIN B-12,) 1000 MCG/ML injection Inject 1,000 mcg into the muscle every 30 (thirty) days.     . hydrochlorothiazide (MICROZIDE) 12.5 MG capsule Take 12.5 mg by mouth daily.    Marland Kitchen ibuprofen (ADVIL) 800 MG tablet Take 1 tablet (800 mg total) by mouth every 8 (eight) hours as needed for moderate pain. 60 tablet 1  . lamoTRIgine (LAMICTAL) 150 MG tablet Take 150 mg by mouth daily.   2  . levothyroxine (SYNTHROID) 75 MCG tablet Take 75 mcg by mouth daily before breakfast.     . Potassium 99 MG TABS Take 99 mg by mouth every other day.    . traZODone (DESYREL) 50 MG tablet Take 50-100 mg by mouth at bedtime as needed.     . Vitamin D, Ergocalciferol, (DRISDOL) 50000 units CAPS capsule Take 1 capsule once a week by mouth.  (Patient not taking: Reported on 11/06/2019)     No current facility-administered medications for this visit.    OBJECTIVE:   There were no vitals filed for this visit. Wt Readings from Last 3 Encounters:  11/17/19 (!) 313 lb 3.2 oz (142.1 kg)  11/07/19 (!) 317 lb (143.8 kg)  10/14/19 (!) 315 lb 1.6 oz (142.9 kg)   There is no height or weight on file to calculate BMI.    ECOG FS:1 - Symptomatic but completely ambulatory  Sclerae unicteric, EOMs intact Wearing a mask No cervical or supraclavicular adenopathy Lungs no rales or rhonchi Heart regular rate and rhythm Abd soft, nontender, positive bowel sounds MSK no focal spinal tenderness, no upper  extremity lymphedema Neuro: nonfocal, well oriented, appropriate affect Breasts:    {Telemedicine visit 12/18/2019}   LAB RESULTS: Lab Results  Component Value Date   WBC 5.5 12/09/2019   NEUTROABS 2.7 12/09/2019   HGB 13.0 12/09/2019   HCT 37.3 12/09/2019   MCV 82.7 12/09/2019   PLT 302 12/09/2019      Chemistry      Component Value Date/Time   NA 140 11/07/2019 1348   NA 139 11/09/2016 0834   K 3.6 11/07/2019 1348   K 3.9 11/09/2016 0834   CL 106 11/07/2019 1348   CO2 22 11/07/2019 1348   CO2 22 11/09/2016 0834   BUN 8 11/07/2019 1348   BUN 8.4 11/09/2016 0834   CREATININE 0.76 11/07/2019 1348   CREATININE 0.78 10/08/2017 0838   CREATININE 0.8 11/09/2016 0834      Component Value Date/Time   CALCIUM 8.1 (L) 11/07/2019 1348  CALCIUM 8.7 11/09/2016 0834   ALKPHOS 66 10/08/2017 0838   ALKPHOS 69 11/09/2016 0834   AST 25 10/08/2017 0838   AST 30 11/09/2016 0834   ALT 21 10/08/2017 0838   ALT 32 11/09/2016 0834   BILITOT 0.4 10/08/2017 0838   BILITOT 0.43 11/09/2016 0834      No results found for: LABCA2  No components found for: [id number]  No results for input(s): INR in the last 168 hours.  Urinalysis    Component Value Date/Time   COLORURINE YELLOW 09/25/2012 1320   APPEARANCEUR CLEAR 09/25/2012 1320   LABSPEC 1.016 09/25/2012 1320   PHURINE 6.0 09/25/2012 1320   GLUCOSEU NEGATIVE 09/25/2012 1320   HGBUR NEGATIVE 09/25/2012 1320   BILIRUBINUR NEGATIVE 09/25/2012 1320   KETONESUR NEGATIVE 09/25/2012 1320   PROTEINUR NEGATIVE 09/25/2012 1320   UROBILINOGEN 0.2 09/25/2012 1320   NITRITE NEGATIVE 09/25/2012 1320   LEUKOCYTESUR NEGATIVE 09/25/2012 1320    STUDIES: No results found.   ASSESSMENT: 50 y.o. 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    (a) Feraheme treatment 09/01/2016 and 09/08/2016  (b) additional Feraheme 06/26/2017  on 07/18/2017.  (c) Feraheme 10/15/2017 and 06/14/2018  (d) Feraheme 10/14/2018 and 10/21/2018  (e) Feraheme 05/22/2019 and 05/29/2019  (1) status post complete hysterectomy with salpingectomy 11/17/2019 with benign pathology   PLAN:  [place]ell's ferritin is in the normal range. Her total iron binding capacity is a little bit high. I think she would benefit from oral iron supplementation as tolerated and I suggested she take 1 tablet daily with food.  Because of her prior gastric bypass this may not be sufficient and I am setting her up for repeat lab work in April and a repeat visit at that time   Sarajane Jews C. Magrinat, MD 05/11/20 10:07 PM Medical Oncology and Hematology [facility] [place], [place] [address] Tel. [phone]    Fax. [phone]   I, Wilburn Mylar, am acting as scribe for Dr. Virgie Dad. Magrinat.  I, Lurline Del MD, have reviewed the above documentation for accuracy and completeness, and I agree with the above.   *Total Encounter Time as defined by the Centers for Medicare and Medicaid Services includes, in addition to the face-to-face time of a patient visit (documented in the note above) non-face-to-face time: obtaining and reviewing outside history, ordering and reviewing medications, tests or procedures, care coordination (communications with other health care professionals or caregivers) and documentation in the medical record.

## 2020-05-12 ENCOUNTER — Inpatient Hospital Stay: Payer: BC Managed Care – PPO | Admitting: Oncology

## 2020-10-03 ENCOUNTER — Encounter (HOSPITAL_COMMUNITY): Payer: Self-pay | Admitting: Emergency Medicine

## 2020-10-03 ENCOUNTER — Emergency Department (HOSPITAL_COMMUNITY)
Admission: EM | Admit: 2020-10-03 | Discharge: 2020-10-04 | Discharge status: Against Medical Advice | Payer: BC Managed Care – PPO | Attending: Emergency Medicine | Admitting: Emergency Medicine

## 2020-10-03 ENCOUNTER — Other Ambulatory Visit: Payer: Self-pay

## 2020-10-03 DIAGNOSIS — Z5321 Procedure and treatment not carried out due to patient leaving prior to being seen by health care provider: Secondary | ICD-10-CM | POA: Diagnosis not present

## 2020-10-03 DIAGNOSIS — M25552 Pain in left hip: Secondary | ICD-10-CM | POA: Diagnosis not present
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
## 2021-05-12 ENCOUNTER — Other Ambulatory Visit: Payer: Self-pay | Admitting: Family Medicine

## 2021-05-12 ENCOUNTER — Encounter: Payer: Self-pay | Admitting: Physician Assistant

## 2021-05-12 DIAGNOSIS — E876 Hypokalemia: Secondary | ICD-10-CM

## 2021-05-12 DIAGNOSIS — Z87898 Personal history of other specified conditions: Secondary | ICD-10-CM

## 2021-05-16 ENCOUNTER — Other Ambulatory Visit: Payer: BC Managed Care – PPO

## 2021-05-23 ENCOUNTER — Ambulatory Visit
Admission: RE | Admit: 2021-05-23 | Discharge: 2021-05-23 | Disposition: A | Discharge status: Home / Self Care | Payer: 59 | Source: Ambulatory Visit | Attending: Family Medicine | Admitting: Family Medicine

## 2021-05-23 ENCOUNTER — Encounter: Payer: Self-pay | Admitting: Physician Assistant

## 2021-05-23 DIAGNOSIS — E876 Hypokalemia: Secondary | ICD-10-CM

## 2021-05-23 DIAGNOSIS — Z87898 Personal history of other specified conditions: Secondary | ICD-10-CM

## 2021-10-07 ENCOUNTER — Encounter (HOSPITAL_BASED_OUTPATIENT_CLINIC_OR_DEPARTMENT_OTHER): Payer: Self-pay

## 2021-10-07 ENCOUNTER — Emergency Department (HOSPITAL_BASED_OUTPATIENT_CLINIC_OR_DEPARTMENT_OTHER)
Admission: EM | Admit: 2021-10-07 | Discharge: 2021-10-07 | Disposition: A | Discharge status: Home / Self Care | Payer: 59 | Attending: Emergency Medicine | Admitting: Emergency Medicine

## 2021-10-07 ENCOUNTER — Other Ambulatory Visit: Payer: Self-pay

## 2021-10-07 ENCOUNTER — Emergency Department (HOSPITAL_BASED_OUTPATIENT_CLINIC_OR_DEPARTMENT_OTHER): Payer: 59

## 2021-10-07 DIAGNOSIS — I10 Essential (primary) hypertension: Secondary | ICD-10-CM | POA: Insufficient documentation

## 2021-10-07 DIAGNOSIS — R296 Repeated falls: Secondary | ICD-10-CM | POA: Diagnosis not present

## 2021-10-07 DIAGNOSIS — R200 Anesthesia of skin: Secondary | ICD-10-CM | POA: Diagnosis not present

## 2021-10-07 DIAGNOSIS — Y903 Blood alcohol level of 60-79 mg/100 ml: Secondary | ICD-10-CM | POA: Insufficient documentation

## 2021-10-07 DIAGNOSIS — Z79899 Other long term (current) drug therapy: Secondary | ICD-10-CM | POA: Insufficient documentation

## 2021-10-07 DIAGNOSIS — M25552 Pain in left hip: Secondary | ICD-10-CM | POA: Insufficient documentation

## 2021-10-07 DIAGNOSIS — R791 Abnormal coagulation profile: Secondary | ICD-10-CM | POA: Diagnosis not present

## 2021-10-07 DIAGNOSIS — R202 Paresthesia of skin: Secondary | ICD-10-CM | POA: Insufficient documentation

## 2021-10-07 LAB — CBC
HCT: 36.8 % (ref 36.0–46.0)
Hemoglobin: 13.1 g/dL (ref 12.0–15.0)
MCH: 31 pg (ref 26.0–34.0)
MCHC: 35.6 g/dL (ref 30.0–36.0)
MCV: 87 fL (ref 80.0–100.0)
Platelets: 227 10*3/uL (ref 150–400)
RBC: 4.23 MIL/uL (ref 3.87–5.11)
RDW: 14.3 % (ref 11.5–15.5)
WBC: 4.9 10*3/uL (ref 4.0–10.5)
nRBC: 0 % (ref 0.0–0.2)

## 2021-10-07 LAB — PROTIME-INR
INR: 1.2 (ref 0.8–1.2)
Prothrombin Time: 15.4 seconds — ABNORMAL HIGH (ref 11.4–15.2)

## 2021-10-07 LAB — DIFFERENTIAL
Abs Immature Granulocytes: 0.01 10*3/uL (ref 0.00–0.07)
Basophils Absolute: 0.1 10*3/uL (ref 0.0–0.1)
Basophils Relative: 1 %
Eosinophils Absolute: 0.2 10*3/uL (ref 0.0–0.5)
Eosinophils Relative: 3 %
Immature Granulocytes: 0 %
Lymphocytes Relative: 38 %
Lymphs Abs: 1.9 10*3/uL (ref 0.7–4.0)
Monocytes Absolute: 0.5 10*3/uL (ref 0.1–1.0)
Monocytes Relative: 10 %
Neutro Abs: 2.3 10*3/uL (ref 1.7–7.7)
Neutrophils Relative %: 48 %

## 2021-10-07 LAB — COMPREHENSIVE METABOLIC PANEL
ALT: 52 U/L — ABNORMAL HIGH (ref 0–44)
AST: 129 U/L — ABNORMAL HIGH (ref 15–41)
Albumin: 4.1 g/dL (ref 3.5–5.0)
Alkaline Phosphatase: 58 U/L (ref 38–126)
Anion gap: 15 (ref 5–15)
BUN: 5 mg/dL — ABNORMAL LOW (ref 6–20)
CO2: 23 mmol/L (ref 22–32)
Calcium: 8.8 mg/dL — ABNORMAL LOW (ref 8.9–10.3)
Chloride: 102 mmol/L (ref 98–111)
Creatinine, Ser: 0.63 mg/dL (ref 0.44–1.00)
GFR, Estimated: >60 mL/min (ref >60)
Glucose, Bld: 85 mg/dL (ref 70–99)
Potassium: 3.1 mmol/L — ABNORMAL LOW (ref 3.5–5.1)
Sodium: 140 mmol/L (ref 135–145)
Total Bilirubin: 1.2 mg/dL (ref 0.3–1.2)
Total Protein: 7.3 g/dL (ref 6.5–8.1)

## 2021-10-07 LAB — ETHANOL: Alcohol, Ethyl (B): 260 mg/dL — ABNORMAL HIGH (ref <10)

## 2021-10-07 LAB — APTT: aPTT: 30 seconds (ref 24–36)

## 2021-10-07 MED ORDER — LORAZEPAM 1 MG PO TABS: 0.5000 mg | ORAL_TABLET | ORAL | Status: DC | PRN

## 2021-10-07 NOTE — Consult Note (Signed)
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 She reports she fell on her left side a few weeks ago. For the last few days she reports hip pain and trouble walking. She reported at midnight she went to bed, and then awoke at 2 AM with left arm numbness just from the shoulder to elbow, and leg numbness from hip down. On exam, she seems to have limited range of motion at hip and shoulder secondary to pain. Also on exam she is very tearful.  Would examine the shoulder and hip for possible injury. Alternatively, thalamic lacunar stroke can cause pain syndrome and can be considered, though less likely given the description and distribution of the pain. Finally, conversion disorder/malingering could be considered given her history and some of the components of her exam. Workup below.   Recommendations:  Hip and shoulder imaging per primary team; consider CT versus MRI   MRI brain without contrast   If stroke on MRI, provide aspirin 81 mg daily and atorvastatin 40 mg daily   If imaging is unremarkable, from a neurologic perspective could be discharged with outpatient PCP an/or PT/OT followup        ---------------------------------------------------------------------------------------------------- Advanced Imaging: Advanced Imaging Deferred because: exam not suggestive of LVO  Metrics: [facility] Notification Time: 10/07/2021 04:58:42 Stamp Time: 10/07/2021 05:03:10 Callback Response Time: 10/07/2021 05:04:36  Primary Provider Notified of Diagnostic Impression and Management Plan on: 10/07/2021 05:25:06  CT  HEAD: Reviewed no pathology ----------------------------------------------------------------------------------------------------  Chief Complaint: Left numbness and pain  History of Present Illness: Patient is a 51 year old Female. This is a 51 year old female with bipolar anxiety/depression who presents to the emergency department for left pain and numbness. She reports she fell on her left side a few weeks ago. For the last few days she reports hip pain and trouble walking. She reported at midnight she went to bed, and then awoke at 2 AM with left arm numbness just from the shoulder to elbow, and leg numbness from hip down. On exam, she seems to have limited range of motion at hip and shoulder secondary to pain.   Past Medical History:      Hypertension  Medications:  No Anticoagulant use  No Antiplatelet use Reviewed EMR for current medications  Allergies:  Reviewed  Social History: Drug Use: No  Family History:  There is no family history of premature cerebrovascular disease pertinent to this consultation  ROS : 14 Points Review of Systems was performed and was negative except mentioned in HPI.  Past Surgical History: There Is No Surgical History Contributory To Today's Visit   Examination: BP(139/89), Pulse(102), 1A: Level of Consciousness - Alert; keenly responsive + 0 1B: Ask Month and Age - Both Questions Right + 0 1C: Blink Eyes & Squeeze Hands - Performs Both Tasks + 0 2: Test Horizontal Extraocular Movements - Normal + 0 3: Test Visual Fields - No Visual Loss + 0 4: Test Facial Palsy (Use Grimace if Obtunded) - Normal symmetry + 0 5A: Test Left Arm Motor Drift - No Drift for 10 Seconds + 0 5B: Test Right Arm Motor Drift - No Drift  for 10 Seconds + 0 6A: Test Left Leg Motor Drift - No Drift for 5 Seconds + 0 6B: Test Right Leg Motor Drift - No Drift for 5 Seconds + 0 7: Test Limb Ataxia (FNF/Heel-Shin) - No Ataxia + 0 8: Test Sensation - Mild-Moderate  Loss: Less Sharp/More Dull + 1 9: Test Language/Aphasia - Normal; No aphasia + 0 10: Test Dysarthria - Normal + 0 11: Test Extinction/Inattention - No abnormality + 0  NIHSS Score: 1  Spoke with : ED provider    Patient / Family was informed the Neurology Consult would occur via TeleHealth consult by way of interactive audio and video telecommunications and consented to receiving care in this manner.  Patient is being evaluated for possible acute neurologic impairment and high probability of imminent or life - threatening deterioration.I spent total of 35 minutes providing care to this patient, including time for face to face visit via telemedicine, review of medical records, imaging studies and discussion of findings with providers, the patient and / or family.   Dr Allena Earing   [facility] For Inpatient follow-up with [facility] physician please call [facility] [phone]. This is not an outpatient service. Post hospital discharge, please contact hospital directly.

## 2021-10-07 NOTE — ED Provider Notes (Signed)
[facility] EMERGENCY DEPT Provider Note   CSN: [id number] Arrival date & time: 10/07/21  0432     History  Chief Complaint  Patient presents with   Numbness   Fall    Charlene [name] is a 51 y.o. female.   Fall Pertinent negatives include no chest pain, no headaches and no shortness of breath.  Patient presents with left-sided numbness. Patient reports she went to bed around midnight.  She woke up around 2 AM and felt numbness in her left arm and left leg.  The numbness in her arm is mostly in the bicep/tricep region She reports her entire left leg is numb She reports she has some pain in the left hip from frequent falls, does have difficulty lifting this at baseline.  She can ambulate. No facial weakness is reported.  No previous history of stroke. No headache or visual changes.  No chest pain or shortness of breath     Home Medications Prior to Admission medications   Medication Sig Start Date End Date Taking? Authorizing Provider  acetaminophen (TYLENOL) 500 MG tablet Take 2 tablets (1,000 mg total) by mouth every 6 (six) hours as needed. 11/18/19   Aloha Gell, MD  amLODipine (NORVASC) 5 MG tablet Take 5 mg by mouth daily. 02/20/19   [provider]  buPROPion (WELLBUTRIN) 75 MG tablet Take 150 mg by mouth daily.    [provider]  cholecalciferol (VITAMIN D3) 25 MCG (1000 UNIT) tablet Take 1,000 Units by mouth daily.    [provider]  cyanocobalamin (,VITAMIN B-12,) 1000 MCG/ML injection Inject 1,000 mcg into the muscle every 30 (thirty) days.  05/09/19   [provider]  hydrochlorothiazide (MICROZIDE) 12.5 MG capsule Take 12.5 mg by mouth daily.    [provider]  ibuprofen (ADVIL) 800 MG tablet Take 1 tablet (800 mg total) by mouth every 8 (eight) hours as needed for moderate pain. 11/18/19   Aloha Gell, MD  lamoTRIgine (LAMICTAL) 150 MG tablet Take 150 mg by mouth daily.  11/13/16   [provider]  levothyroxine (SYNTHROID) 75 MCG tablet Take 75 mcg by mouth daily before breakfast.  05/20/19   [provider]  Potassium 99 MG TABS Take 99 mg by mouth every other day.    [provider]  traZODone (DESYREL) 50 MG tablet Take 50-100 mg by mouth at bedtime as needed.  01/11/19   [provider]  Vitamin D, Ergocalciferol, (DRISDOL) 50000 units CAPS capsule Take 1 capsule once a week by mouth.  Patient not taking: Reported on 11/06/2019    [provider]      Allergies    Penicillins and Shellfish allergy    Review of Systems   Review of Systems  Constitutional:  Negative for fever.  Eyes:  Negative for visual disturbance.  Respiratory:  Negative for shortness of breath.   Cardiovascular:  Negative for chest pain.  Musculoskeletal:  Positive for arthralgias. Negative for back pain and neck pain.       Denies any new back or neck pain  Neurological:  Positive for numbness. Negative for speech difficulty and headaches.    Physical Exam Updated Vital Signs BP 133/85   Pulse 77   Temp 98.4 F (36.9 C)   Resp 16   Ht 1.778 m ('5\' 10"'$ )   Wt 124.7 kg   LMP 09/17/2019   SpO2 95%   BMI 39.46 kg/m  Physical Exam CONSTITUTIONAL: Well developed/well nourished, tearful and anxious HEAD: Normocephalic/atraumatic  EYES: EOMI/PERRL, no nystagmus, no ptosis ENMT: Mucous membranes moist NECK: supple no meningeal signs, no bruits CV: S1/S2 noted, tachycardic LUNGS: Lungs are clear to auscultation bilaterally, no apparent distress ABDOMEN: soft, nontender NEURO:Awake/alert, face symmetric, no arm drift is noted No drift of the right leg. ? Drift of left leg though patient reports it is due to pain She reports sensory deficit to the left bicep and tricep region.  Reports numbness throughout the left leg NIHSS=2 EXTREMITIES: pulses normalx4, full ROM SKIN: warm, color normal PSYCH: Patient crying throughout the exam  ED Results /  Procedures / Treatments   Labs (all labs ordered are listed, but only abnormal results are displayed) Labs Reviewed  ETHANOL - Abnormal; Notable for the following components:      Result Value   Alcohol, Ethyl (B) 260 (*)    All other components within normal limits  PROTIME-INR - Abnormal; Notable for the following components:   Prothrombin Time 15.4 (*)    All other components within normal limits  COMPREHENSIVE METABOLIC PANEL - Abnormal; Notable for the following components:   Potassium 3.1 (*)    BUN 5 (*)    Calcium 8.8 (*)    AST 129 (*)    ALT 52 (*)    All other components within normal limits  APTT  CBC  DIFFERENTIAL    EKG EKG Interpretation  Date/Time:  Friday October 07 2021 05:23:33 EDT Ventricular Rate:  79 PR Interval:  190 QRS Duration: 88 QT Interval:  407 QTC Calculation: 467 R Axis:   29 Text Interpretation: Sinus rhythm Anterior infarct, old No previous ECGs available Confirmed by Ripley Fraise [phone] on 10/07/2021 5:39:12 AM  Radiology DG Hip [place] W or [place] Pelvis 1 View Left  Result Date: 10/07/2021 CLINICAL DATA:  [place] 2 weeks ago with continued left hip pain. EXAM: DG HIP (WITH OR WITHOUT PELVIS) 1V PORT LEFT COMPARISON:  None Available. FINDINGS: There is no evidence of hip fracture or dislocation. There is mild symmetric joint space loss and osteophytosis both hips. No pelvic fracture or diastasis is seen. Area soft tissues are unremarkable. 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 Electronically Signed   By: Telford Nab M.D.   On: 10/07/2021 06:07   CT HEAD WO CONTRAST  Result Date: 10/07/2021 CLINICAL DATA:  Fall several days ago. Awoke with left lower extremity numbness EXAM: CT HEAD WITHOUT CONTRAST TECHNIQUE: Contiguous axial images were obtained from the base of the skull through the vertex without intravenous contrast. RADIATION DOSE REDUCTION: This exam was performed according to the departmental dose-optimization  program which includes automated exposure control, adjustment of the mA and/or kV according to patient size and/or use of iterative reconstruction technique. COMPARISON:  None Available. FINDINGS: Brain: No evidence of acute infarction, hemorrhage, hydrocephalus, extra-axial collection or mass lesion/mass effect. Vascular: No hyperdense vessel or unexpected calcification. Skull: Normal. Negative for fracture or focal lesion. Sinuses/Orbits: No acute finding. IMPRESSION: Negative head CT. Electronically Signed   By: Jorje Guild M.D.   On: 10/07/2021 05:14    Procedures Procedures    Medications Ordered in ED Medications  LORazepam (ATIVAN) tablet 0.5 mg (has no administration in time range)    ED Course/ Medical Decision Making/ A&P Clinical Course as of 10/07/21 0160  Fri Oct 07, 2021  0458 Patient reports she typically has pain in her left hip which does make it challenging to lift her left leg.  However now she has numbness in her left arm  and left leg which is new.  Last known well approximately midnight.  Will initiate stroke work-up.  Patient is not a candidate for acute intervention or TNK at this time given timeframe [DW]  0507 Teleneurology has been consulted and will evaluate the patient [DW]  0528 CT head was personally visualized and is negative [DW]  0545 Discussed the case with on-call teleneurology Dr. Kathrin Ruddy.  He has interviewed the patient and reviewed imaging.  Less likely CVA, more likely MSK cause.  He would consider plain imaging for any pain patient has.  Will consider MRI brain to rule out small thalamic stroke.  If MRI brain is negative patient could likely be discharged [DW]  0546 On exam patient is awake and alert.  She is still tearful.  There is no left shoulder tenderness and she can fully range the shoulder. [DW]  6734   She does have tenderness with range of motion of left hip.  Will obtain x-ray of hip [DW]  0623 X-ray negative of left hip.  We will proceed  with MRI brain.  Also of note her alcohol level is over 260 [DW]  [phone] Patient now requesting discharge.  She reports her ride is headed home.  She reports she is feeling improved.  At this point given her history, exam and neurology evaluation, suspicion for acute CVA is low at this time.  MRI will be canceled.  She will follow-up with her PCP. [DW]    Clinical Course User Index [DW] Ripley Fraise, MD                 NIH Stroke Scale: 2          Medical Decision Making Amount and/or Complexity of Data Reviewed Labs: ordered. Radiology: ordered.  Risk Prescription drug management.   This patient presents to the ED for concern of numbness and weakness, this involves an extensive number of treatment options, and is a complaint that carries with it a high risk of complications and morbidity.  The differential diagnosis includes but is not limited to CVA, intracranial hemorrhage, acute coronary syndrome, renal failure, urinary tract infection, electrolyte disturbance,     Comorbidities that complicate the patient evaluation: Patient's presentation is complicated by their history of hypertension  Social Determinants of Health: Patient's  anxiety   increases the complexity of managing their presentation  Additional history obtained: Records reviewed Care Everywhere/External Records  Lab Tests: I Ordered, and personally interpreted labs.  The pertinent results include: Alcohol level elevated  Imaging Studies ordered: I ordered imaging studies including CT scan head   I independently visualized and interpreted imaging which showed no acute findings I agree with the radiologist interpretation  Cardiac Monitoring: The patient was maintained on a cardiac monitor.  I personally viewed and interpreted the cardiac monitor which showed an underlying rhythm of:  sinus tachycardia  Medicines ordered and prescription drug management: Patient declines pain meds  Consultations Obtained: I  requested consultation with the consultant teleneurology , and discussed  findings as well as pertinent plan - they recommend: MRI brain, if negative can be discharged  Reevaluation: After the interventions noted above, I reevaluated the patient and found that they have :stayed the same  Complexity of problems addressed: Patient's presentation is most consistent with  acute presentation with potential threat to life or bodily function  Disposition: Discharge home  Patient requesting discharge home.  She is awake and alert at this time.  Although her alcohol is elevated, she has been answering  questions appropriately.  Given that suspicion for CVA is less likely after her work-up, patient will be discharged Advised she can come back anytime.  She will follow-up with her PCP.  If there is any new/unilateral weakness she was advised to call [phone] immediately       Final Clinical Impression(s) / ED Diagnoses Final diagnoses:  Left hip pain  Paresthesia    Rx / DC Orders ED Discharge Orders     None         Ripley Fraise, MD 10/07/21 [phone]

## 2021-10-07 NOTE — ED Triage Notes (Signed)
Tearful when ambulating to treatment room.   Pt sts fall several days ago and has been having L Hip pain but able to ambulate. Went to bed feeling normal and woke up ~2am with LLE numbness. 2+ pedal pulse and 2+ posterior tibialis pulse.   Also sts LUE numbness with limited ROM of upper arm. Good radial pulse and strength with lower arm.

## 2021-10-12 ENCOUNTER — Other Ambulatory Visit: Payer: Self-pay

## 2021-10-12 ENCOUNTER — Emergency Department (HOSPITAL_BASED_OUTPATIENT_CLINIC_OR_DEPARTMENT_OTHER): Admission: EM | Admit: 2021-10-12 | Discharge: 2021-10-12 | Discharge status: Against Medical Advice | Payer: 59

## 2021-10-12 NOTE — ED Notes (Signed)
Patient called for Triage and Assessment. Per Registration, Patient LWBS Before Triage. To be discharged accordingly.

## 2021-12-14 ENCOUNTER — Encounter: Payer: Self-pay | Admitting: Physician Assistant

## 2022-01-13 ENCOUNTER — Ambulatory Visit (INDEPENDENT_AMBULATORY_CARE_PROVIDER_SITE_OTHER): Payer: 59 | Admitting: Adult Health

## 2022-01-13 ENCOUNTER — Encounter: Payer: Self-pay | Admitting: Adult Health

## 2022-01-13 VITALS — BP 146/98 | HR 79 | Ht 69.0 in | Wt 282.0 lb

## 2022-01-13 DIAGNOSIS — F319 Bipolar disorder, unspecified: Secondary | ICD-10-CM

## 2022-01-13 MED ORDER — LAMOTRIGINE 25 MG PO TABS: ORAL_TABLET | ORAL | 2 refills | Status: DC

## 2022-01-13 MED ORDER — BUPROPION HCL ER (XL) 150 MG PO TB24: 150.0000 mg | ORAL_TABLET | Freq: Every day | ORAL | 2 refills | Status: DC

## 2022-01-13 NOTE — Progress Notes (Signed)
[facility] MD/PA/NP Initial Note  01/13/2022 10:40 AM Charlene [name]  MRN:  [id number]  Chief Complaint:   HPI:   Patient seen today for initial psychiatric evaluation.   Previously seen at the [facility].  Describes mood today as - Mood symptoms - reports depression. Reports some irritability. Denies anxiety. Reports worry, rumination, and over thinking. Reports obsessive thoughts. Reports mood fluctuations - having highs and lows - "more lows currently". Reports decreased interest and motivation. Stating "I haven't been feeling good". Has been off of medications for 6 months or more. [facility] and was prescribed Lamictal and Wellbutrin. Would like to restart these medications and is willing to consider other options. Would also like to restart therapy. Energy levels lower. Active, does not have a regular exercise routine.  Enjoys some usual interests and activities. Divorced. Has 2 children at home 14 and 11. Mother living with her. Spending time with family. Appetite decreased. Weight loss - 20 pounds. Sleeping a lot. Averages 10 hours. Focus and concentration stable. Completing tasks. Managing aspects of household. Working full time - [employer] - has a flexible schedule. Denies SI or HI.  Denies AH or VH. Denies self harming. Denies alcohol use.  Previous medication trials: Lexapro, Lamictal, Wellbutrin, Naltrexone, Trazadone - nightmares  Visit Diagnosis:    ICD-10-CM   1. Bipolar I disorder (HCC)  F31.9 buPROPion (WELLBUTRIN XL) 150 MG 24 hr tablet    lamoTRIgine (LAMICTAL) 25 MG tablet      Past Psychiatric History: Denies psychiatric hospitalization.   Past Medical History:  Past Medical History:  Diagnosis Date   Hypertension    Lactating mother 07/13/2010   No pertinent past medical history     Past Surgical History:  Procedure Laterality Date   GASTRIC BYPASS  2000   HERNIA REPAIR  2004   ROBOTIC ASSISTED  LAPAROSCOPIC HYSTERECTOMY AND SALPINGECTOMY Bilateral 11/17/2019   Procedure: XI ROBOTIC ASSISTED LAPAROSCOPIC HYSTERECTOMY AND BILATERAL SALPINGECTOMY;  Surgeon: Aloha Gell, MD;  Location: [facility];  Service: Gynecology;  Laterality: Bilateral;   VENTRAL HERNIA REPAIR  2004    Family Psychiatric History: Family history of mental illness.   Family History:  Family History  Problem Relation Age of Onset   Hypertension Mother    Heart disease Father    Hypertension Father     Social History:  Social History   Socioeconomic History   Marital status: Married    Spouse name: Not on file   Number of children: Not on file   Years of education: Not on file   Highest education level: Not on file  Occupational History   Not on file  Tobacco Use   Smoking status: Never   Smokeless tobacco: Never  Vaping Use   Vaping Use: Never used  Substance and Sexual Activity   Alcohol use: Yes    Comment: social drinker   Drug use: No   Sexual activity: Yes    Birth control/protection: I.U.D.  Other Topics Concern   Not on file  Social History Narrative   Not on file   Social Determinants of Health   Financial Resource Strain: Not on file  Food Insecurity: Not on file  Transportation Needs: Not on file  Physical Activity: Not on file  Stress: Not on file  Social Connections: Not on file    Allergies:  Allergies  Allergen Reactions   Penicillins Hives and Shortness Of Breath    Heart rate increases   Shellfish Allergy  Anaphylaxis    Swells up face Swells up face    Metabolic Disorder Labs: No results found for: "HGBA1C", "MPG" No results found for: "PROLACTIN" No results found for: "CHOL", "TRIG", "HDL", "CHOLHDL", "VLDL", "LDLCALC" No results found for: "TSH"  Therapeutic Level Labs: No results found for: "LITHIUM" No results found for: "VALPROATE" No results found for: "CBMZ"  Current Medications: Current Outpatient Medications  Medication  Sig Dispense Refill   buPROPion (WELLBUTRIN XL) 150 MG 24 hr tablet Take 1 tablet (150 mg total) by mouth daily. 30 tablet 2   lamoTRIgine (LAMICTAL) 25 MG tablet Take one tablet at bedtime for 2 weeks, then take two tablets at bedtime. 60 tablet 2   acetaminophen (TYLENOL) 500 MG tablet Take 2 tablets (1,000 mg total) by mouth every 6 (six) hours as needed. 60 tablet 1   amLODipine (NORVASC) 5 MG tablet Take 5 mg by mouth daily.     buPROPion (WELLBUTRIN) 75 MG tablet Take 150 mg by mouth daily.     cholecalciferol (VITAMIN D3) 25 MCG (1000 UNIT) tablet Take 1,000 Units by mouth daily.     cyanocobalamin (,VITAMIN B-12,) 1000 MCG/ML injection Inject 1,000 mcg into the muscle every 30 (thirty) days.      hydrochlorothiazide (MICROZIDE) 12.5 MG capsule Take 12.5 mg by mouth daily.     ibuprofen (ADVIL) 800 MG tablet Take 1 tablet (800 mg total) by mouth every 8 (eight) hours as needed for moderate pain. 60 tablet 1   lamoTRIgine (LAMICTAL) 150 MG tablet Take 150 mg by mouth daily.   2   levothyroxine (SYNTHROID) 75 MCG tablet Take 75 mcg by mouth daily before breakfast.      Potassium 99 MG TABS Take 99 mg by mouth every other day.     Vitamin D, Ergocalciferol, (DRISDOL) 50000 units CAPS capsule Take 1 capsule once a week by mouth.  (Patient not taking: Reported on 11/06/2019)     No current facility-administered medications for this visit.    Medication Side Effects: none  Orders placed this visit:  No orders of the defined types were placed in this encounter.   Psychiatric Specialty Exam:  Review of Systems  Musculoskeletal:  Negative for gait problem.  Neurological:  Negative for tremors.  Psychiatric/Behavioral:         Please refer to HPI    Height '5\' 9"'$  (1.753 m), weight 282 lb (127.9 kg), last menstrual period 09/17/2019.Body mass index is 41.64 kg/m.  General Appearance: Casual and Neat  Eye Contact:  Good  Speech:  Clear and Coherent and Normal Rate  Volume:  Normal   Mood:  Euthymic  Affect:  Appropriate and Congruent  Thought Process:  Coherent and Descriptions of Associations: Intact  Orientation:  Full (Time, Place, and Person)  Thought Content: Logical   Suicidal Thoughts:  No  Homicidal Thoughts:  No  Memory:  WNL  Judgement:  Good  Insight:  Good  Psychomotor Activity:  Normal  Concentration:  Concentration: Good and Attention Span: Good  Recall:  Good  Fund of Knowledge: Good  Language: Good  Assets:  Communication Skills Desire for Improvement Financial Resources/Insurance Housing Intimacy Leisure Time Physical Health Resilience Social Support Talents/Skills Transportation Vocational/Educational  ADL's:  Intact  Cognition: WNL  Prognosis:  Good   Screenings:  [facility] ED from 10/07/2021 in [facility] ED from 10/03/2020 in [facility] DEPT  C-SSRS RISK CATEGORY No Risk No Risk       Receiving Psychotherapy:  No   Treatment Plan/Recommendations:   Plan:  PDMP reviewed  Add Wellbutrin XL '150mg'$  every morning. Add Lamictal '25mg'$  every night for 2 weeks and then increase to two tablets at bedtime.  Set up with therapist  Plans to call PCP about restarting BP meds  146/98/79  RTC 4 weeks  Patient advised to contact office with any questions, adverse effects, or acute worsening in signs and symptoms.   Counseled patient regarding potential benefits, risks, and side effects of Lamictal to include potential risk of Stevens-Johnson syndrome. Advised patient to stop taking Lamictal and contact office immediately if rash develops and to seek urgent medical attention if rash is severe and/or spreading quickly.     Time spent with patient was 60 minutes. Greater than 50% of face to face time with patient was spent on counseling and coordination of care.    Aloha Gell, NP

## 2022-01-16 ENCOUNTER — Ambulatory Visit: Payer: 59 | Admitting: Behavioral Health

## 2022-02-09 ENCOUNTER — Other Ambulatory Visit: Payer: Self-pay | Admitting: Adult Health

## 2022-02-09 DIAGNOSIS — F319 Bipolar disorder, unspecified: Secondary | ICD-10-CM

## 2022-02-09 NOTE — Telephone Encounter (Signed)
Has an appt with Barnett Applebaum tomorrow.

## 2022-02-10 ENCOUNTER — Encounter: Payer: Self-pay | Admitting: Adult Health

## 2022-02-10 ENCOUNTER — Ambulatory Visit (INDEPENDENT_AMBULATORY_CARE_PROVIDER_SITE_OTHER): Payer: 59 | Admitting: Adult Health

## 2022-02-10 DIAGNOSIS — F319 Bipolar disorder, unspecified: Secondary | ICD-10-CM | POA: Diagnosis not present

## 2022-02-10 NOTE — Progress Notes (Signed)
[name] [id number] [birth year] 52 y.o.  Subjective:   Patient ID:  Charlene [name] is a 52 y.o. (DOB [birth year]) female.  Chief Complaint: No chief complaint on file.   HPI Charlene [name] presents to the office today for follow-up of Bipolar disorder.  Previously seen at the [facility].  Describes mood today as "better". Mood symptoms - denies depression, anxiety, and irritability. Reports some worry, rumination, and over thinking. Decreased obsessive thoughts. Mood is consistent - feels "leveled" out.  Reports variable interest and motivation with recent illness. Stating "I'm feeling better". Restarted the Lamictal and Wellbutrin. Plans to see therapist.. Energy levels lower. Active, does not have a regular exercise routine.  Enjoys some usual interests and activities. Divorced. Has 2 children at home 14 and 11. Mother living with her. Spending time with family. Appetite decreased. Weight stable. Sleeping well most nights. Averages 6 to 7 hours. Focus and concentration stable. Completing tasks. Managing aspects of household. Working full time - [employer] - has a flexible schedule. Denies SI or HI.  Denies AH or VH. Denies self harming. Denies alcohol use.  Recovering from the flu.  Previous medication trials: Lexapro, Lamictal, Wellbutrin, Naltrexone, Trazadone - nightmares  Flowsheet Row ED from 10/07/2021 in [facility] Emergency Department at [facility] ED from 10/03/2020 in [facility] Emergency Department at [place] No Risk No Risk        Review of Systems:  Review of Systems  Musculoskeletal:  Negative for gait problem.  Neurological:  Negative for tremors.  Psychiatric/Behavioral:         Please refer to HPI    Medications: I have reviewed the patient's current medications.  Current Outpatient Medications  Medication Sig Dispense Refill   acetaminophen (TYLENOL) 500 MG tablet Take 2  tablets (1,000 mg total) by mouth every 6 (six) hours as needed. 60 tablet 1   amLODipine (NORVASC) 5 MG tablet Take 5 mg by mouth daily.     buPROPion (WELLBUTRIN XL) 150 MG 24 hr tablet Take 1 tablet (150 mg total) by mouth daily. 30 tablet 2   cholecalciferol (VITAMIN D3) 25 MCG (1000 UNIT) tablet Take 1,000 Units by mouth daily.     cyanocobalamin (,VITAMIN B-12,) 1000 MCG/ML injection Inject 1,000 mcg into the muscle every 30 (thirty) days.      hydrochlorothiazide (MICROZIDE) 12.5 MG capsule Take 12.5 mg by mouth daily.     ibuprofen (ADVIL) 800 MG tablet Take 1 tablet (800 mg total) by mouth every 8 (eight) hours as needed for moderate pain. 60 tablet 1   lamoTRIgine (LAMICTAL) 25 MG tablet Take one tablet at bedtime for 2 weeks, then take two tablets at bedtime. 60 tablet 2   levothyroxine (SYNTHROID) 75 MCG tablet Take 75 mcg by mouth daily before breakfast.      Potassium 99 MG TABS Take 99 mg by mouth every other day.     Vitamin D, Ergocalciferol, (DRISDOL) 50000 units CAPS capsule Take 1 capsule once a week by mouth.  (Patient not taking: Reported on 11/06/2019)     No current facility-administered medications for this visit.    Medication Side Effects: None  Allergies:  Allergies  Allergen Reactions   Penicillins Hives and Shortness Of Breath    Heart rate increases   Shellfish Allergy Anaphylaxis    Swells up face Swells up face    Past Medical History:  Diagnosis Date   Hypertension    Lactating mother 07/13/2010   No  pertinent past medical history     Past Medical History, Surgical history, Social history, and Family history were reviewed and updated as appropriate.   Please see review of systems for further details on the patient's review from today.   Objective:   Physical Exam:  LMP 09/17/2019   Physical Exam Constitutional:      General: She is not in acute distress. Musculoskeletal:        General: No deformity.  Neurological:     Mental Status:  She is alert and oriented to person, place, and time.     Coordination: Coordination normal.  Psychiatric:        Attention and Perception: Attention and perception normal. She does not perceive auditory or visual hallucinations.        Mood and Affect: Mood normal. Mood is not anxious or depressed. Affect is not labile, blunt, angry or inappropriate.        Speech: Speech normal.        Behavior: Behavior normal.        Thought Content: Thought content normal. Thought content is not paranoid or delusional. Thought content does not include homicidal or suicidal ideation. Thought content does not include homicidal or suicidal plan.        Cognition and Memory: Cognition and memory normal.        Judgment: Judgment normal.     Comments: Insight intact     Lab Review:     Component Value Date/Time   NA 140 10/07/2021 0526   NA 139 11/09/2016 0834   K 3.1 (L) 10/07/2021 0526   K 3.9 11/09/2016 0834   CL 102 10/07/2021 0526   CO2 23 10/07/2021 0526   CO2 22 11/09/2016 0834   GLUCOSE 85 10/07/2021 0526   GLUCOSE 91 11/09/2016 0834   BUN 5 (L) 10/07/2021 0526   BUN 8.4 11/09/2016 0834   CREATININE 0.63 10/07/2021 0526   CREATININE 0.78 10/08/2017 0838   CREATININE 0.8 11/09/2016 0834   CALCIUM 8.8 (L) 10/07/2021 0526   CALCIUM 8.7 11/09/2016 0834   PROT 7.3 10/07/2021 0526   PROT 7.4 11/09/2016 0834   ALBUMIN 4.1 10/07/2021 0526   ALBUMIN 3.8 11/09/2016 0834   AST 129 (H) 10/07/2021 0526   AST 25 10/08/2017 0838   AST 30 11/09/2016 0834   ALT 52 (H) 10/07/2021 0526   ALT 21 10/08/2017 0838   ALT 32 11/09/2016 0834   ALKPHOS 58 10/07/2021 0526   ALKPHOS 69 11/09/2016 0834   BILITOT 1.2 10/07/2021 0526   BILITOT 0.4 10/08/2017 0838   BILITOT 0.43 11/09/2016 0834   GFRNONAA >60 10/07/2021 0526   GFRNONAA >60 10/08/2017 0838   GFRAA >60 10/08/2017 0838       Component Value Date/Time   WBC 4.9 10/07/2021 0526   RBC 4.23 10/07/2021 0526   HGB 13.1 10/07/2021 0526   HGB  11.9 10/08/2017 0838   HGB 12.5 11/09/2016 0834   HCT 36.8 10/07/2021 0526   HCT 37.1 11/09/2016 0834   PLT 227 10/07/2021 0526   PLT 238 10/08/2017 0838   PLT 214 11/09/2016 0834   MCV 87.0 10/07/2021 0526   MCV 83.9 11/09/2016 0834   MCH 31.0 10/07/2021 0526   MCHC 35.6 10/07/2021 0526   RDW 14.3 10/07/2021 0526   RDW 18.9 (H) 11/09/2016 0834   LYMPHSABS 1.9 10/07/2021 0526   LYMPHSABS 1.6 11/09/2016 0834   MONOABS 0.5 10/07/2021 0526   MONOABS 0.6 11/09/2016 0834   EOSABS 0.2 10/07/2021  0526   EOSABS 0.3 11/09/2016 0834   BASOSABS 0.1 10/07/2021 0526   BASOSABS 0.1 11/09/2016 0834    No results found for: "POCLITH", "LITHIUM"   No results found for: "PHENYTOIN", "PHENOBARB", "VALPROATE", "CBMZ"   .res Assessment: Plan:    Plan:  PDMP reviewed  Wellbutrin XL 159m every morning. Lamictal 272mtwo tablets at bedtime.  Will discuss increase in medications at next visit - once recovered from flu.  Set up with therapist  RTC 4 weeks  Patient advised to contact office with any questions, adverse effects, or acute worsening in signs and symptoms.   Counseled patient regarding potential benefits, risks, and side effects of Lamictal to include potential risk of Stevens-Johnson syndrome. Advised patient to stop taking Lamictal and contact office immediately if rash develops and to seek urgent medical attention if rash is severe and/or spreading quickly.    Time spent with patient was 15 minutes. Greater than 50% of face to face time with patient was spent on counseling and coordination of care.    Diagnoses and all orders for this visit:  Bipolar I disorder ([facility]    Please see After Visit Summary for patient specific instructions.  Future Appointments  Date Time Provider [place]3/08/2022  8:40 AM Tyrianna Lightle, ReBerdie OgrenNP CP-CP None    No orders of the defined types were placed in this encounter.   -------------------------------

## 2022-02-20 ENCOUNTER — Ambulatory Visit (INDEPENDENT_AMBULATORY_CARE_PROVIDER_SITE_OTHER): Payer: Self-pay | Admitting: Behavioral Health

## 2022-02-20 DIAGNOSIS — F4320 Adjustment disorder, unspecified: Secondary | ICD-10-CM

## 2022-02-20 NOTE — Progress Notes (Unsigned)
The patient no showed her appointment today 02/20/22.

## 2022-02-21 ENCOUNTER — Encounter: Payer: Self-pay | Admitting: Behavioral Health

## 2022-03-10 ENCOUNTER — Ambulatory Visit (INDEPENDENT_AMBULATORY_CARE_PROVIDER_SITE_OTHER): Payer: 59 | Admitting: Adult Health

## 2022-03-10 ENCOUNTER — Encounter: Payer: Self-pay | Admitting: Adult Health

## 2022-03-10 DIAGNOSIS — F319 Bipolar disorder, unspecified: Secondary | ICD-10-CM | POA: Diagnosis not present

## 2022-03-10 MED ORDER — LAMOTRIGINE 100 MG PO TABS: ORAL_TABLET | ORAL | 1 refills | Status: DC

## 2022-03-10 NOTE — Progress Notes (Signed)
Charlene [name] [id number] 31-Dec-1970 52 y.o.  Subjective:   Patient ID:  Charlene [name] is a 52 y.o. (DOB 09-28-1970) female.  Chief Complaint: No chief complaint on file.   HPI Charlene [name] presents to the office today for follow-up of Bipolar disorder.  Previously seen at the [facility].  Describes mood today as "better". Mood symptoms - denies depression, anxiety, and irritability. Reports some worry, rumination, and over thinking. Decreased obsessive thoughts. Mood is consistent. Stating "I'm feeling better". Restarted the Lamictal and Wellbutrin and feels they have been helpful - would like to increase dose of Lamictal to '100mg'$  daily (previous dose). Plans to see therapist. Energy levels a little better. Active, does not have a regular exercise routine.  Enjoys some usual interests and activities. Divorced. Has 2 children at home 14 and 11. Mother living with her. Spending time with family. Appetite decreased. Weight stable. Sleeping well most nights. Averages 6 to 7 hours. Focus and concentration stable. Completing tasks. Managing aspects of household. Working full time - [employer] - has a flexible schedule. Denies SI or HI.  Denies AH or VH. Denies self harming. Denies alcohol use.    Previous medication trials: Lexapro, Lamictal, Wellbutrin, Naltrexone, Trazadone - nightmares   Flowsheet Row ED from 10/07/2021 in [facility] Emergency Department at [facility] ED from 10/03/2020 in [facility] Emergency Department at [place] No Risk No Risk        Review of Systems:  Review of Systems  Musculoskeletal:  Negative for gait problem.  Neurological:  Negative for tremors.  Psychiatric/Behavioral:         Please refer to HPI    Medications: I have reviewed the patient's current medications.  Current Outpatient Medications  Medication Sig Dispense Refill   acetaminophen (TYLENOL) 500 MG tablet Take 2  tablets (1,000 mg total) by mouth every 6 (six) hours as needed. 60 tablet 1   amLODipine (NORVASC) 5 MG tablet Take 5 mg by mouth daily.     buPROPion (WELLBUTRIN XL) 150 MG 24 hr tablet TAKE 1 TABLET BY MOUTH EVERY DAY 90 tablet 1   cholecalciferol (VITAMIN D3) 25 MCG (1000 UNIT) tablet Take 1,000 Units by mouth daily.     cyanocobalamin (,VITAMIN B-12,) 1000 MCG/ML injection Inject 1,000 mcg into the muscle every 30 (thirty) days.      hydrochlorothiazide (MICROZIDE) 12.5 MG capsule Take 12.5 mg by mouth daily.     ibuprofen (ADVIL) 800 MG tablet Take 1 tablet (800 mg total) by mouth every 8 (eight) hours as needed for moderate pain. 60 tablet 1   lamoTRIgine (LAMICTAL) 25 MG tablet Take one tablet at bedtime for 2 weeks, then take two tablets at bedtime. 60 tablet 2   levothyroxine (SYNTHROID) 75 MCG tablet Take 75 mcg by mouth daily before breakfast.      Potassium 99 MG TABS Take 99 mg by mouth every other day.     Vitamin D, Ergocalciferol, (DRISDOL) 50000 units CAPS capsule Take 1 capsule once a week by mouth.  (Patient not taking: Reported on 11/06/2019)     No current facility-administered medications for this visit.    Medication Side Effects: None  Allergies:  Allergies  Allergen Reactions   Penicillins Hives and Shortness Of Breath    Heart rate increases   Shellfish Allergy Anaphylaxis    Swells up face Swells up face    Past Medical History:  Diagnosis Date   Hypertension    Lactating mother  07/13/2010   No pertinent past medical history     Past Medical History, Surgical history, Social history, and Family history were reviewed and updated as appropriate.   Please see review of systems for further details on the patient's review from today.   Objective:   Physical Exam:  LMP 09/17/2019   Physical Exam Constitutional:      General: She is not in acute distress. Musculoskeletal:        General: No deformity.  Neurological:     Mental Status: She is alert  and oriented to person, place, and time.     Coordination: Coordination normal.  Psychiatric:        Attention and Perception: Attention and perception normal. She does not perceive auditory or visual hallucinations.        Mood and Affect: Mood normal. Mood is not anxious or depressed. Affect is not labile, blunt, angry or inappropriate.        Speech: Speech normal.        Behavior: Behavior normal.        Thought Content: Thought content normal. Thought content is not paranoid or delusional. Thought content does not include homicidal or suicidal ideation. Thought content does not include homicidal or suicidal plan.        Cognition and Memory: Cognition and memory normal.        Judgment: Judgment normal.     Comments: Insight intact     Lab Review:     Component Value Date/Time   NA 140 10/07/2021 0526   NA 139 11/09/2016 0834   K 3.1 (L) 10/07/2021 0526   K 3.9 11/09/2016 0834   CL 102 10/07/2021 0526   CO2 23 10/07/2021 0526   CO2 22 11/09/2016 0834   GLUCOSE 85 10/07/2021 0526   GLUCOSE 91 11/09/2016 0834   BUN 5 (L) 10/07/2021 0526   BUN 8.4 11/09/2016 0834   CREATININE 0.63 10/07/2021 0526   CREATININE 0.78 10/08/2017 0838   CREATININE 0.8 11/09/2016 0834   CALCIUM 8.8 (L) 10/07/2021 0526   CALCIUM 8.7 11/09/2016 0834   PROT 7.3 10/07/2021 0526   PROT 7.4 11/09/2016 0834   ALBUMIN 4.1 10/07/2021 0526   ALBUMIN 3.8 11/09/2016 0834   AST 129 (H) 10/07/2021 0526   AST 25 10/08/2017 0838   AST 30 11/09/2016 0834   ALT 52 (H) 10/07/2021 0526   ALT 21 10/08/2017 0838   ALT 32 11/09/2016 0834   ALKPHOS 58 10/07/2021 0526   ALKPHOS 69 11/09/2016 0834   BILITOT 1.2 10/07/2021 0526   BILITOT 0.4 10/08/2017 0838   BILITOT 0.43 11/09/2016 0834   GFRNONAA >60 10/07/2021 0526   GFRNONAA >60 10/08/2017 0838   GFRAA >60 10/08/2017 0838       Component Value Date/Time   WBC 4.9 10/07/2021 0526   RBC 4.23 10/07/2021 0526   HGB 13.1 10/07/2021 0526   HGB 11.9  10/08/2017 0838   HGB 12.5 11/09/2016 0834   HCT 36.8 10/07/2021 0526   HCT 37.1 11/09/2016 0834   PLT 227 10/07/2021 0526   PLT 238 10/08/2017 0838   PLT 214 11/09/2016 0834   MCV 87.0 10/07/2021 0526   MCV 83.9 11/09/2016 0834   MCH 31.0 10/07/2021 0526   MCHC 35.6 10/07/2021 0526   RDW 14.3 10/07/2021 0526   RDW 18.9 (H) 11/09/2016 0834   LYMPHSABS 1.9 10/07/2021 0526   LYMPHSABS 1.6 11/09/2016 0834   MONOABS 0.5 10/07/2021 0526   MONOABS 0.6 11/09/2016 [id number]  EOSABS 0.2 10/07/2021 0526   EOSABS 0.3 11/09/2016 0834   BASOSABS 0.1 10/07/2021 0526   BASOSABS 0.1 11/09/2016 0834    No results found for: "POCLITH", "LITHIUM"   No results found for: "PHENYTOIN", "PHENOBARB", "VALPROATE", "CBMZ"   .res Assessment: Plan:    Plan:  PDMP reviewed  Wellbutrin XL '150mg'$  every morning. Increase Lamictal '50mg'$  to '100mg'$  at bedtime.  Set up with therapist  RTC 4 weeks  Patient advised to contact office with any questions, adverse effects, or acute worsening in signs and symptoms.   Counseled patient regarding potential benefits, risks, and side effects of Lamictal to include potential risk of Stevens-Johnson syndrome. Advised patient to stop taking Lamictal and contact office immediately if rash develops and to seek urgent medical attention if rash is severe and/or spreading quickly.    Time spent with patient was 15 minutes. Greater than 50% of face to face time with patient was spent on counseling and coordination of care.    There are no diagnoses linked to this encounter.   Please see After Visit Summary for patient specific instructions.  No future appointments.  No orders of the defined types were placed in this encounter.   -------------------------------

## 2022-04-07 ENCOUNTER — Ambulatory Visit: Payer: 59 | Admitting: Adult Health

## 2022-05-19 ENCOUNTER — Encounter: Payer: Self-pay | Admitting: Adult Health

## 2022-05-19 ENCOUNTER — Ambulatory Visit (INDEPENDENT_AMBULATORY_CARE_PROVIDER_SITE_OTHER): Payer: 59 | Admitting: Adult Health

## 2022-05-19 DIAGNOSIS — F319 Bipolar disorder, unspecified: Secondary | ICD-10-CM | POA: Diagnosis not present

## 2022-05-19 DIAGNOSIS — G4700 Insomnia, unspecified: Secondary | ICD-10-CM

## 2022-05-19 MED ORDER — HYDROXYZINE HCL 25 MG PO TABS: ORAL_TABLET | ORAL | 2 refills | Status: DC

## 2022-05-19 MED ORDER — LAMOTRIGINE 150 MG PO TABS: ORAL_TABLET | ORAL | 1 refills | Status: DC

## 2022-05-19 NOTE — Progress Notes (Signed)
[name] [id number] 11-01-1970 52 y.o.  Subjective:   Patient ID:  Charlene [name] is a 52 y.o. (DOB 11/22/1970) female.  Chief Complaint: No chief complaint on file.   HPI Aelita Selwyn presents to the office today for follow-up of Bipolar disorder.  Previously seen at the [facility].  Describes mood today as "better". Mood symptoms - reports depression and irritability. Reports some "situational" anxiety. Reports some worry, rumination, and over thinking. Reports obsessive thoughts, denies acts. Mood is variable. Stating "I don't feel like I'm even". Feels like medications are helpful, but is willing to consider changes. Plans to see therapist. Reports improved interest. Taking medications as prescribed. Energy levels improved. Active, does not have a regular exercise routine.  Enjoys some usual interests and activities. Divorced. Has 2 children at home 14 and 11. Mother living with her. Spending time with family. Appetite adequate. Weight gain  - a few pounds. Sleeping well most nights. Averages 6 hours. Focus and concentration stable. Completing tasks. Managing aspects of household. Working full time - [employer] - has a flexible schedule. Denies SI or HI.  Denies AH or VH. Denies self harming. Denies alcohol use.  Previous medication trials: Lexapro, Lamictal, Wellbutrin, Naltrexone, Trazadone - nightmares   Flowsheet Row ED from 10/07/2021 in [facility] Emergency Department at [facility] ED from 10/03/2020 in [facility] Emergency Department at [facility]  C-SSRS RISK CATEGORY No Risk No Risk        Review of Systems:  Review of Systems  Musculoskeletal:  Negative for gait problem.  Neurological:  Negative for tremors.  Psychiatric/Behavioral:         Please refer to HPI    Medications: I have reviewed the patient's current medications.  Current Outpatient Medications  Medication Sig Dispense Refill   acetaminophen  (TYLENOL) 500 MG tablet Take 2 tablets (1,000 mg total) by mouth every 6 (six) hours as needed. 60 tablet 1   amLODipine (NORVASC) 5 MG tablet Take 5 mg by mouth daily.     buPROPion (WELLBUTRIN XL) 150 MG 24 hr tablet TAKE 1 TABLET BY MOUTH EVERY DAY 90 tablet 1   cholecalciferol (VITAMIN D3) 25 MCG (1000 UNIT) tablet Take 1,000 Units by mouth daily.     cyanocobalamin (,VITAMIN B-12,) 1000 MCG/ML injection Inject 1,000 mcg into the muscle every 30 (thirty) days.      hydrochlorothiazide (MICROZIDE) 12.5 MG capsule Take 12.5 mg by mouth daily.     ibuprofen (ADVIL) 800 MG tablet Take 1 tablet (800 mg total) by mouth every 8 (eight) hours as needed for moderate pain. 60 tablet 1   lamoTRIgine (LAMICTAL) 100 MG tablet Take one tablet at bedtime. 90 tablet 1   levothyroxine (SYNTHROID) 75 MCG tablet Take 75 mcg by mouth daily before breakfast.      Potassium 99 MG TABS Take 99 mg by mouth every other day.     Vitamin D, Ergocalciferol, (DRISDOL) 50000 units CAPS capsule Take 1 capsule once a week by mouth.  (Patient not taking: Reported on 11/06/2019)     No current facility-administered medications for this visit.    Medication Side Effects: None  Allergies:  Allergies  Allergen Reactions   Penicillins Hives and Shortness Of Breath    Heart rate increases   Shellfish Allergy Anaphylaxis    Swells up face Swells up face    Past Medical History:  Diagnosis Date   Hypertension    Lactating mother 07/13/2010   No pertinent past medical history  Past Medical History, Surgical history, Social history, and Family history were reviewed and updated as appropriate.   Please see review of systems for further details on the patient's review from today.   Objective:   Physical Exam:  LMP 09/17/2019   Physical Exam Constitutional:      General: She is not in acute distress. Musculoskeletal:        General: No deformity.  Neurological:     Mental Status: She is alert and oriented to  person, place, and time.     Coordination: Coordination normal.  Psychiatric:        Attention and Perception: Attention and perception normal. She does not perceive auditory or visual hallucinations.        Mood and Affect: Mood normal. Mood is not anxious or depressed. Affect is not labile, blunt, angry or inappropriate.        Speech: Speech normal.        Behavior: Behavior normal.        Thought Content: Thought content normal. Thought content is not paranoid or delusional. Thought content does not include homicidal or suicidal ideation. Thought content does not include homicidal or suicidal plan.        Cognition and Memory: Cognition and memory normal.        Judgment: Judgment normal.     Comments: Insight intact     Lab Review:     Component Value Date/Time   NA 140 10/07/2021 0526   NA 139 11/09/2016 0834   K 3.1 (L) 10/07/2021 0526   K 3.9 11/09/2016 0834   CL 102 10/07/2021 0526   CO2 23 10/07/2021 0526   CO2 22 11/09/2016 0834   GLUCOSE 85 10/07/2021 0526   GLUCOSE 91 11/09/2016 0834   BUN 5 (L) 10/07/2021 0526   BUN 8.4 11/09/2016 0834   CREATININE 0.63 10/07/2021 0526   CREATININE 0.78 10/08/2017 0838   CREATININE 0.8 11/09/2016 0834   CALCIUM 8.8 (L) 10/07/2021 0526   CALCIUM 8.7 11/09/2016 0834   PROT 7.3 10/07/2021 0526   PROT 7.4 11/09/2016 0834   ALBUMIN 4.1 10/07/2021 0526   ALBUMIN 3.8 11/09/2016 0834   AST 129 (H) 10/07/2021 0526   AST 25 10/08/2017 0838   AST 30 11/09/2016 0834   ALT 52 (H) 10/07/2021 0526   ALT 21 10/08/2017 0838   ALT 32 11/09/2016 0834   ALKPHOS 58 10/07/2021 0526   ALKPHOS 69 11/09/2016 0834   BILITOT 1.2 10/07/2021 0526   BILITOT 0.4 10/08/2017 0838   BILITOT 0.43 11/09/2016 0834   GFRNONAA >60 10/07/2021 0526   GFRNONAA >60 10/08/2017 0838   GFRAA >60 10/08/2017 0838       Component Value Date/Time   WBC 4.9 10/07/2021 0526   RBC 4.23 10/07/2021 0526   HGB 13.1 10/07/2021 0526   HGB 11.9 10/08/2017 0838   HGB  12.5 11/09/2016 0834   HCT 36.8 10/07/2021 0526   HCT 37.1 11/09/2016 0834   PLT 227 10/07/2021 0526   PLT 238 10/08/2017 0838   PLT 214 11/09/2016 0834   MCV 87.0 10/07/2021 0526   MCV 83.9 11/09/2016 0834   MCH 31.0 10/07/2021 0526   MCHC 35.6 10/07/2021 0526   RDW 14.3 10/07/2021 0526   RDW 18.9 (H) 11/09/2016 0834   LYMPHSABS 1.9 10/07/2021 0526   LYMPHSABS 1.6 11/09/2016 0834   MONOABS 0.5 10/07/2021 0526   MONOABS 0.6 11/09/2016 0834   EOSABS 0.2 10/07/2021 0526   EOSABS 0.3 11/09/2016 0834  BASOSABS 0.1 10/07/2021 0526   BASOSABS 0.1 11/09/2016 0834    No results found for: "POCLITH", "LITHIUM"   No results found for: "PHENYTOIN", "PHENOBARB", "VALPROATE", "CBMZ"   .res Assessment: Plan:    Plan:  PDMP reviewed  Wellbutrin XL 150mg  every morning. Increase Lamictal 100mg  to 150mg  at bedtime. Add Hydroxyzine 25mg  - 1 to 2 at hs.  Set up with therapist  RTC 4 weeks  Patient advised to contact office with any questions, adverse effects, or acute worsening in signs and symptoms.   Counseled patient regarding potential benefits, risks, and side effects of Lamictal to include potential risk of Stevens-Johnson syndrome. Advised patient to stop taking Lamictal and contact office immediately if rash develops and to seek urgent medical attention if rash is severe and/or spreading quickly.    Time spent with patient was 15 minutes. Greater than 50% of face to face time with patient was spent on counseling and coordination of care.    Diagnoses and all orders for this visit:  Bipolar I disorder (HCC)     Please see After Visit Summary for patient specific instructions.  No future appointments.   No orders of the defined types were placed in this encounter.   -------------------------------

## 2022-05-20 ENCOUNTER — Other Ambulatory Visit: Payer: Self-pay | Admitting: Adult Health

## 2022-05-20 DIAGNOSIS — F319 Bipolar disorder, unspecified: Secondary | ICD-10-CM

## 2022-06-10 ENCOUNTER — Other Ambulatory Visit: Payer: Self-pay | Admitting: Adult Health

## 2022-06-10 DIAGNOSIS — G4700 Insomnia, unspecified: Secondary | ICD-10-CM

## 2022-06-16 ENCOUNTER — Ambulatory Visit (INDEPENDENT_AMBULATORY_CARE_PROVIDER_SITE_OTHER): Payer: 59 | Admitting: Adult Health

## 2022-06-16 ENCOUNTER — Encounter: Payer: Self-pay | Admitting: Adult Health

## 2022-06-16 DIAGNOSIS — F319 Bipolar disorder, unspecified: Secondary | ICD-10-CM

## 2022-06-16 NOTE — Progress Notes (Signed)
Charlene [name] [id number] [birth year] 52 y.o.  Virtual Visit via Telephone Note  I connected with pt on 06/16/22 at  9:40 AM EDT by telephone and verified that I am speaking with the correct person using two identifiers.   I discussed the limitations, risks, security and privacy concerns of performing an evaluation and management service by telephone and the availability of in person appointments. I also discussed with the patient that there may be a patient responsible charge related to this service. The patient expressed understanding and agreed to proceed.   I discussed the assessment and treatment plan with the patient. The patient was provided an opportunity to ask questions and all were answered. The patient agreed with the plan and demonstrated an understanding of the instructions.   The patient was advised to call back or seek an in-person evaluation if the symptoms worsen or if the condition fails to improve as anticipated.  I provided 10 minutes of non-face-to-face time during this encounter.  The patient was located at home.  The provider was located at [facility].   Dorothyann Gibbs, NP   Subjective:   Patient ID:  Charlene [name] is a 52 y.o. (DOB 08-Apr-1970) female.  Chief Complaint: No chief complaint on file.   HPI Charlene [name] presents for follow-up of Bipolar disorder.  Previously seen at the [facility].  Describes mood today as "better". Mood symptoms - reports some situational depression - one of her children is having some mental health issues. Denies anxiety and irritability. Reports some worry, rumination, and over thinking. Reports obsessive thoughts, denies acts. Mood is consistent. Stating "I feel like I'm doing better". Feels like medications are helpful. Plans to see therapist. Reports improved interest and motivation. Taking medications as prescribed. Energy levels improved. Active, does not have a regular exercise  routine. Has started walking in the evenings. Enjoys some usual interests and activities. Divorced. Has 2 children at home 14 and 11. Mother living with her. Spending time with family. Appetite adequate. Weight gain. Sleeps well most nights. Averages 6 or mores hours. Focus and concentration stable - really well. Completing tasks. Managing aspects of household. Working full time. Denies SI or HI.  Denies AH or VH. Denies self harming. Denies alcohol use.  Previous medication trials: Lexapro, Lamictal, Wellbutrin, Naltrexone, Trazadone - nightmares   Review of Systems:  Review of Systems  Musculoskeletal:  Negative for gait problem.  Neurological:  Negative for tremors.  Psychiatric/Behavioral:         Please refer to HPI    Medications: I have reviewed the patient's current medications.  Current Outpatient Medications  Medication Sig Dispense Refill   acetaminophen (TYLENOL) 500 MG tablet Take 2 tablets (1,000 mg total) by mouth every 6 (six) hours as needed. 60 tablet 1   amLODipine (NORVASC) 5 MG tablet Take 5 mg by mouth daily.     buPROPion (WELLBUTRIN XL) 150 MG 24 hr tablet TAKE 1 TABLET BY MOUTH EVERY DAY 90 tablet 1   cholecalciferol (VITAMIN D3) 25 MCG (1000 UNIT) tablet Take 1,000 Units by mouth daily.     cyanocobalamin (,VITAMIN B-12,) 1000 MCG/ML injection Inject 1,000 mcg into the muscle every 30 (thirty) days.      hydrochlorothiazide (MICROZIDE) 12.5 MG capsule Take 12.5 mg by mouth daily.     hydrOXYzine (ATARAX) 25 MG tablet Take one to two tablets at bedtime. 60 tablet 2   ibuprofen (ADVIL) 800 MG tablet Take 1 tablet (800 mg total) by mouth every 8 (  eight) hours as needed for moderate pain. 60 tablet 1   lamoTRIgine (LAMICTAL) 150 MG tablet Take one tablet at bedtime. 90 tablet 1   levothyroxine (SYNTHROID) 75 MCG tablet Take 75 mcg by mouth daily before breakfast.      Potassium 99 MG TABS Take 99 mg by mouth every other day.     Vitamin D, Ergocalciferol,  (DRISDOL) 50000 units CAPS capsule Take 1 capsule once a week by mouth.  (Patient not taking: Reported on 11/06/2019)     No current facility-administered medications for this visit.    Medication Side Effects: None  Allergies:  Allergies  Allergen Reactions   Penicillins Hives and Shortness Of Breath    Heart rate increases   Shellfish Allergy Anaphylaxis    Swells up face Swells up face    Past Medical History:  Diagnosis Date   Hypertension    Lactating mother 07/13/2010   No pertinent past medical history     Family History  Problem Relation Age of Onset   Hypertension Mother    Heart disease Father    Hypertension Father     Social History   Socioeconomic History   Marital status: Divorced    Spouse name: Not on file   Number of children: Not on file   Years of education: Not on file   Highest education level: Not on file  Occupational History   Not on file  Tobacco Use   Smoking status: Never   Smokeless tobacco: Never  Vaping Use   Vaping Use: Never used  Substance and Sexual Activity   Alcohol use: Yes    Comment: social drinker   Drug use: No   Sexual activity: Yes    Birth control/protection: I.U.D.  Other Topics Concern   Not on file  Social History Narrative   Not on file   Social Determinants of Health   Financial Resource Strain: Not on file  Food Insecurity: Not on file  Transportation Needs: Not on file  Physical Activity: Not on file  Stress: Not on file  Social Connections: Not on file  Intimate Partner Violence: Not on file    Past Medical History, Surgical history, Social history, and Family history were reviewed and updated as appropriate.   Please see review of systems for further details on the patient's review from today.   Objective:   Physical Exam:  LMP 09/17/2019   Physical Exam Constitutional:      General: She is not in acute distress. Musculoskeletal:        General: No deformity.  Neurological:     Mental  Status: She is alert and oriented to person, place, and time.     Coordination: Coordination normal.  Psychiatric:        Attention and Perception: Attention and perception normal. She does not perceive auditory or visual hallucinations.        Mood and Affect: Mood normal. Mood is not anxious or depressed. Affect is not labile, blunt, angry or inappropriate.        Speech: Speech normal.        Behavior: Behavior normal.        Thought Content: Thought content normal. Thought content is not paranoid or delusional. Thought content does not include homicidal or suicidal ideation. Thought content does not include homicidal or suicidal plan.        Cognition and Memory: Cognition and memory normal.        Judgment: Judgment normal.  Comments: Insight intact     Lab Review:     Component Value Date/Time   NA 140 10/07/2021 0526   NA 139 11/09/2016 0834   K 3.1 (L) 10/07/2021 0526   K 3.9 11/09/2016 0834   CL 102 10/07/2021 0526   CO2 23 10/07/2021 0526   CO2 22 11/09/2016 0834   GLUCOSE 85 10/07/2021 0526   GLUCOSE 91 11/09/2016 0834   BUN 5 (L) 10/07/2021 0526   BUN 8.4 11/09/2016 0834   CREATININE 0.63 10/07/2021 0526   CREATININE 0.78 10/08/2017 0838   CREATININE 0.8 11/09/2016 0834   CALCIUM 8.8 (L) 10/07/2021 0526   CALCIUM 8.7 11/09/2016 0834   PROT 7.3 10/07/2021 0526   PROT 7.4 11/09/2016 0834   ALBUMIN 4.1 10/07/2021 0526   ALBUMIN 3.8 11/09/2016 0834   AST 129 (H) 10/07/2021 0526   AST 25 10/08/2017 0838   AST 30 11/09/2016 0834   ALT 52 (H) 10/07/2021 0526   ALT 21 10/08/2017 0838   ALT 32 11/09/2016 0834   ALKPHOS 58 10/07/2021 0526   ALKPHOS 69 11/09/2016 0834   BILITOT 1.2 10/07/2021 0526   BILITOT 0.4 10/08/2017 0838   BILITOT 0.43 11/09/2016 0834   GFRNONAA >60 10/07/2021 0526   GFRNONAA >60 10/08/2017 0838   GFRAA >60 10/08/2017 0838       Component Value Date/Time   WBC 4.9 10/07/2021 0526   RBC 4.23 10/07/2021 0526   HGB 13.1 10/07/2021 0526    HGB 11.9 10/08/2017 0838   HGB 12.5 11/09/2016 0834   HCT 36.8 10/07/2021 0526   HCT 37.1 11/09/2016 0834   PLT 227 10/07/2021 0526   PLT 238 10/08/2017 0838   PLT 214 11/09/2016 0834   MCV 87.0 10/07/2021 0526   MCV 83.9 11/09/2016 0834   MCH 31.0 10/07/2021 0526   MCHC 35.6 10/07/2021 0526   RDW 14.3 10/07/2021 0526   RDW 18.9 (H) 11/09/2016 0834   LYMPHSABS 1.9 10/07/2021 0526   LYMPHSABS 1.6 11/09/2016 0834   MONOABS 0.5 10/07/2021 0526   MONOABS 0.6 11/09/2016 0834   EOSABS 0.2 10/07/2021 0526   EOSABS 0.3 11/09/2016 0834   BASOSABS 0.1 10/07/2021 0526   BASOSABS 0.1 11/09/2016 0834    No results found for: "POCLITH", "LITHIUM"   No results found for: "PHENYTOIN", "PHENOBARB", "VALPROATE", "CBMZ"   .res Assessment: Plan:    Plan:  PDMP reviewed  Wellbutrin XL 150mg  every morning. Lamictal 150mg  at bedtime. Hydroxyzine 25mg  - 1 to 2 at hs.  RTC 4 weeks  Patient advised to contact office with any questions, adverse effects, or acute worsening in signs and symptoms.   Counseled patient regarding potential benefits, risks, and side effects of Lamictal to include potential risk of Stevens-Johnson syndrome. Advised patient to stop taking Lamictal and contact office immediately if rash develops and to seek urgent medical attention if rash is severe and/or spreading quickly.     There are no diagnoses linked to this encounter.  Please see After Visit Summary for patient specific instructions.  Future Appointments  Date Time Provider Department Center  06/16/2022  9:40 AM Duanne Duchesne, Thereasa Solo, NP [facility] None    No orders of the defined types were placed in this encounter.     -------------------------------

## 2022-07-21 ENCOUNTER — Ambulatory Visit (INDEPENDENT_AMBULATORY_CARE_PROVIDER_SITE_OTHER): Payer: Self-pay | Admitting: Adult Health

## 2022-07-21 DIAGNOSIS — Z0389 Encounter for observation for other suspected diseases and conditions ruled out: Secondary | ICD-10-CM

## 2022-07-21 NOTE — Progress Notes (Signed)
Patient no show appointment. ? ?

## 2022-09-12 ENCOUNTER — Encounter: Payer: Self-pay | Admitting: Adult Health

## 2022-09-12 ENCOUNTER — Ambulatory Visit (INDEPENDENT_AMBULATORY_CARE_PROVIDER_SITE_OTHER): Payer: 59 | Admitting: Adult Health

## 2022-09-12 DIAGNOSIS — F319 Bipolar disorder, unspecified: Secondary | ICD-10-CM | POA: Diagnosis not present

## 2022-09-12 DIAGNOSIS — G4700 Insomnia, unspecified: Secondary | ICD-10-CM | POA: Diagnosis not present

## 2022-09-12 MED ORDER — LAMOTRIGINE 200 MG PO TABS: ORAL_TABLET | ORAL | 1 refills | Status: DC

## 2022-09-12 NOTE — Progress Notes (Signed)
Charlene [name] [id number] 26-Jan-1970 52 y.o.  Subjective:   Patient ID:  Charlene [name] is a 52 y.o. (DOB 12/24/1970) female.  Chief Complaint: No chief complaint on file.   HPI Charlene [name] presents to the office today for follow-up of Bipolar disorder.  Previously seen at the [facility].  Describes mood today as "better". Mood symptoms - reports some depression and anxiety - "more situational". Denies irritability. Denies panic attacks. Reports some worry, rumination, and over thinking. Denies obsessive thoughts and acts. Mood is consistent. Stating "I feel like I'm doing ok". Feels like medications are helpful. Plans to see therapist. Reports improved interest and motivation. Taking medications as prescribed. Energy levels improved. Active, does not have a regular exercise routine - walking some days. Enjoys some usual interests and activities. Divorced. Has 2 children at home. Mother living with her. Spending time with family. Appetite adequate. Weight gain. Sleeps well most nights. Averages 6 to 7 hours during the week and longer on the weekends. Focus and concentration stable - really well. Completing tasks. Managing aspects of household. Reports being laid off from current job. Denies SI or HI.  Denies AH or VH. Denies self harming. Denies alcohol use.  Previous medication trials: Lexapro, Lamictal, Wellbutrin, Naltrexone, Trazadone - nightmares   Flowsheet Row ED from 10/07/2021 in [facility] Emergency Department at [facility] ED from 10/03/2020 in [facility] Emergency Department at [facility]  C-SSRS RISK CATEGORY No Risk No Risk        Review of Systems:  Review of Systems  Musculoskeletal:  Negative for gait problem.  Neurological:  Negative for tremors.  Psychiatric/Behavioral:         Please refer to HPI    Medications: I have reviewed the patient's current medications.  Current Outpatient Medications  Medication Sig  Dispense Refill   acetaminophen (TYLENOL) 500 MG tablet Take 2 tablets (1,000 mg total) by mouth every 6 (six) hours as needed. 60 tablet 1   amLODipine (NORVASC) 5 MG tablet Take 5 mg by mouth daily.     buPROPion (WELLBUTRIN XL) 150 MG 24 hr tablet TAKE 1 TABLET BY MOUTH EVERY DAY 90 tablet 1   cholecalciferol (VITAMIN D3) 25 MCG (1000 UNIT) tablet Take 1,000 Units by mouth daily.     cyanocobalamin (,VITAMIN B-12,) 1000 MCG/ML injection Inject 1,000 mcg into the muscle every 30 (thirty) days.      hydrochlorothiazide (MICROZIDE) 12.5 MG capsule Take 12.5 mg by mouth daily.     hydrOXYzine (ATARAX) 25 MG tablet Take one to two tablets at bedtime. 60 tablet 2   ibuprofen (ADVIL) 800 MG tablet Take 1 tablet (800 mg total) by mouth every 8 (eight) hours as needed for moderate pain. 60 tablet 1   lamoTRIgine (LAMICTAL) 200 MG tablet Take one tablet at bedtime. 90 tablet 1   levothyroxine (SYNTHROID) 75 MCG tablet Take 75 mcg by mouth daily before breakfast.      Potassium 99 MG TABS Take 99 mg by mouth every other day.     Vitamin D, Ergocalciferol, (DRISDOL) 50000 units CAPS capsule Take 1 capsule once a week by mouth.  (Patient not taking: Reported on 11/06/2019)     No current facility-administered medications for this visit.    Medication Side Effects: None  Allergies:  Allergies  Allergen Reactions   Penicillins Hives and Shortness Of Breath    Heart rate increases   Shellfish Allergy Anaphylaxis    Swells up face Swells up face  Past Medical History:  Diagnosis Date   Hypertension    Lactating mother 07/13/2010   No pertinent past medical history     Past Medical History, Surgical history, Social history, and Family history were reviewed and updated as appropriate.   Please see review of systems for further details on the patient's review from today.   Objective:   Physical Exam:  LMP 09/17/2019   Physical Exam Constitutional:      General: She is not in acute  distress. Musculoskeletal:        General: No deformity.  Neurological:     Mental Status: She is alert and oriented to person, place, and time.     Coordination: Coordination normal.  Psychiatric:        Attention and Perception: Attention and perception normal. She does not perceive auditory or visual hallucinations.        Mood and Affect: Affect is not labile, blunt, angry or inappropriate.        Speech: Speech normal.        Behavior: Behavior normal.        Thought Content: Thought content normal. Thought content is not paranoid or delusional. Thought content does not include homicidal or suicidal ideation. Thought content does not include homicidal or suicidal plan.        Cognition and Memory: Cognition and memory normal.        Judgment: Judgment normal.     Comments: Insight intact     Lab Review:     Component Value Date/Time   NA 140 10/07/2021 0526   NA 139 11/09/2016 0834   K 3.1 (L) 10/07/2021 0526   K 3.9 11/09/2016 0834   CL 102 10/07/2021 0526   CO2 23 10/07/2021 0526   CO2 22 11/09/2016 0834   GLUCOSE 85 10/07/2021 0526   GLUCOSE 91 11/09/2016 0834   BUN 5 (L) 10/07/2021 0526   BUN 8.4 11/09/2016 0834   CREATININE 0.63 10/07/2021 0526   CREATININE 0.78 10/08/2017 0838   CREATININE 0.8 11/09/2016 0834   CALCIUM 8.8 (L) 10/07/2021 0526   CALCIUM 8.7 11/09/2016 0834   PROT 7.3 10/07/2021 0526   PROT 7.4 11/09/2016 0834   ALBUMIN 4.1 10/07/2021 0526   ALBUMIN 3.8 11/09/2016 0834   AST 129 (H) 10/07/2021 0526   AST 25 10/08/2017 0838   AST 30 11/09/2016 0834   ALT 52 (H) 10/07/2021 0526   ALT 21 10/08/2017 0838   ALT 32 11/09/2016 0834   ALKPHOS 58 10/07/2021 0526   ALKPHOS 69 11/09/2016 0834   BILITOT 1.2 10/07/2021 0526   BILITOT 0.4 10/08/2017 0838   BILITOT 0.43 11/09/2016 0834   GFRNONAA >60 10/07/2021 0526   GFRNONAA >60 10/08/2017 0838   GFRAA >60 10/08/2017 0838       Component Value Date/Time   WBC 4.9 10/07/2021 0526   RBC 4.23  10/07/2021 0526   HGB 13.1 10/07/2021 0526   HGB 11.9 10/08/2017 0838   HGB 12.5 11/09/2016 0834   HCT 36.8 10/07/2021 0526   HCT 37.1 11/09/2016 0834   PLT 227 10/07/2021 0526   PLT 238 10/08/2017 0838   PLT 214 11/09/2016 0834   MCV 87.0 10/07/2021 0526   MCV 83.9 11/09/2016 0834   MCH 31.0 10/07/2021 0526   MCHC 35.6 10/07/2021 0526   RDW 14.3 10/07/2021 0526   RDW 18.9 (H) 11/09/2016 0834   LYMPHSABS 1.9 10/07/2021 0526   LYMPHSABS 1.6 11/09/2016 0834   MONOABS 0.5 10/07/2021 0526  MONOABS 0.6 11/09/2016 0834   EOSABS 0.2 10/07/2021 0526   EOSABS 0.3 11/09/2016 0834   BASOSABS 0.1 10/07/2021 0526   BASOSABS 0.1 11/09/2016 0834    No results found for: "POCLITH", "LITHIUM"   No results found for: "PHENYTOIN", "PHENOBARB", "VALPROATE", "CBMZ"   .res Assessment: Plan:    Plan:  PDMP reviewed  Increase Lamictal 150mg  to 200mg  at bedtime.  Wellbutrin XL 150mg  every morning. Hydroxyzine 25mg  - 1 to 2 at hs.  RTC 4 weeks  Patient advised to contact office with any questions, adverse effects, or acute worsening in signs and symptoms.   Counseled patient regarding potential benefits, risks, and side effects of Lamictal to include potential risk of Stevens-Johnson syndrome. Advised patient to stop taking Lamictal and contact office immediately if rash develops and to seek urgent medical attention if rash is severe and/or spreading quickly.     Diagnoses and all orders for this visit:  Bipolar I disorder (HCC) -     lamoTRIgine (LAMICTAL) 200 MG tablet; Take one tablet at bedtime.     Please see After Visit Summary for patient specific instructions.  No future appointments.   No orders of the defined types were placed in this encounter.   -------------------------------

## 2022-09-21 ENCOUNTER — Other Ambulatory Visit: Payer: Self-pay | Admitting: Gastroenterology

## 2022-09-21 DIAGNOSIS — R7401 Elevation of levels of liver transaminase levels: Secondary | ICD-10-CM

## 2022-09-23 ENCOUNTER — Other Ambulatory Visit: Payer: Self-pay | Admitting: Adult Health

## 2022-09-23 DIAGNOSIS — F319 Bipolar disorder, unspecified: Secondary | ICD-10-CM

## 2022-10-03 ENCOUNTER — Encounter: Payer: Self-pay | Admitting: Physician Assistant

## 2022-10-10 ENCOUNTER — Encounter: Payer: Self-pay | Admitting: Physician Assistant

## 2022-10-10 ENCOUNTER — Ambulatory Visit (INDEPENDENT_AMBULATORY_CARE_PROVIDER_SITE_OTHER): Payer: 59 | Admitting: Adult Health

## 2022-10-15 ENCOUNTER — Other Ambulatory Visit: Payer: Self-pay | Admitting: Adult Health

## 2022-10-15 DIAGNOSIS — G4700 Insomnia, unspecified: Secondary | ICD-10-CM

## 2023-10-22 ENCOUNTER — Observation Stay (HOSPITAL_COMMUNITY)
Admission: EM | Admit: 2023-10-22 | Discharge: 2023-10-24 | Payer: Self-pay | Attending: Internal Medicine | Admitting: Internal Medicine

## 2023-10-22 ENCOUNTER — Encounter: Payer: Self-pay | Admitting: Physician Assistant

## 2023-10-22 DIAGNOSIS — I10 Essential (primary) hypertension: Secondary | ICD-10-CM | POA: Insufficient documentation

## 2023-10-22 DIAGNOSIS — Z79899 Other long term (current) drug therapy: Secondary | ICD-10-CM | POA: Insufficient documentation

## 2023-10-22 DIAGNOSIS — X58XXXA Exposure to other specified factors, initial encounter: Secondary | ICD-10-CM | POA: Insufficient documentation

## 2023-10-22 DIAGNOSIS — R9431 Abnormal electrocardiogram [ECG] [EKG]: Secondary | ICD-10-CM | POA: Diagnosis present

## 2023-10-22 DIAGNOSIS — D61818 Other pancytopenia: Secondary | ICD-10-CM | POA: Insufficient documentation

## 2023-10-22 DIAGNOSIS — E876 Hypokalemia: Secondary | ICD-10-CM | POA: Insufficient documentation

## 2023-10-22 DIAGNOSIS — D509 Iron deficiency anemia, unspecified: Secondary | ICD-10-CM | POA: Insufficient documentation

## 2023-10-22 DIAGNOSIS — E039 Hypothyroidism, unspecified: Secondary | ICD-10-CM | POA: Insufficient documentation

## 2023-10-22 DIAGNOSIS — F1010 Alcohol abuse, uncomplicated: Secondary | ICD-10-CM | POA: Insufficient documentation

## 2023-10-22 DIAGNOSIS — T1491XA Suicide attempt, initial encounter: Principal | ICD-10-CM | POA: Insufficient documentation

## 2023-10-22 DIAGNOSIS — T50902A Poisoning by unspecified drugs, medicaments and biological substances, intentional self-harm, initial encounter: Secondary | ICD-10-CM | POA: Insufficient documentation

## 2023-10-22 DIAGNOSIS — F1090 Alcohol use, unspecified, uncomplicated: Secondary | ICD-10-CM | POA: Insufficient documentation

## 2023-10-22 DIAGNOSIS — I4581 Long QT syndrome: Principal | ICD-10-CM | POA: Insufficient documentation

## 2023-10-22 LAB — URINE DRUG SCREEN
Amphetamines: NEGATIVE
Barbiturates: NEGATIVE
Benzodiazepines: NEGATIVE
Cocaine: NEGATIVE
Fentanyl: NEGATIVE
Methadone Scn, Ur: NEGATIVE
Opiates: NEGATIVE
Tetrahydrocannabinol: NEGATIVE

## 2023-10-22 LAB — CBC WITH DIFFERENTIAL/PLATELET
Abs Immature Granulocytes: 0.01 K/uL (ref 0.00–0.07)
Basophils Absolute: 0 K/uL (ref 0.0–0.1)
Basophils Relative: 1 %
Eosinophils Absolute: 0.2 K/uL (ref 0.0–0.5)
Eosinophils Relative: 5 %
HCT: 32.9 % — ABNORMAL LOW (ref 36.0–46.0)
Hemoglobin: 10.8 g/dL — ABNORMAL LOW (ref 12.0–15.0)
Immature Granulocytes: 0 %
Lymphocytes Relative: 52 %
Lymphs Abs: 1.8 K/uL (ref 0.7–4.0)
MCH: 24.5 pg — ABNORMAL LOW (ref 26.0–34.0)
MCHC: 32.8 g/dL (ref 30.0–36.0)
MCV: 74.6 fL — ABNORMAL LOW (ref 80.0–100.0)
Monocytes Absolute: 0.5 K/uL (ref 0.1–1.0)
Monocytes Relative: 15 %
Neutro Abs: 1 K/uL — ABNORMAL LOW (ref 1.7–7.7)
Neutrophils Relative %: 27 %
Platelets: 122 K/uL — ABNORMAL LOW (ref 150–400)
RBC: 4.41 MIL/uL (ref 3.87–5.11)
RDW: 22.4 % — ABNORMAL HIGH (ref 11.5–15.5)
WBC: 3.6 K/uL — ABNORMAL LOW (ref 4.0–10.5)
nRBC: 0 % (ref 0.0–0.2)

## 2023-10-22 LAB — SALICYLATE LEVEL: Salicylate Lvl: <7 mg/dL — ABNORMAL LOW (ref 7.0–30.0)

## 2023-10-22 LAB — COMPREHENSIVE METABOLIC PANEL WITH GFR
ALT: 77 U/L — ABNORMAL HIGH (ref 0–44)
AST: 274 U/L — ABNORMAL HIGH (ref 15–41)
Albumin: 4.1 g/dL (ref 3.5–5.0)
Alkaline Phosphatase: 101 U/L (ref 38–126)
Anion gap: 17 — ABNORMAL HIGH (ref 5–15)
BUN: 6 mg/dL (ref 6–20)
CO2: 18 mmol/L — ABNORMAL LOW (ref 22–32)
Calcium: 8.4 mg/dL — ABNORMAL LOW (ref 8.9–10.3)
Chloride: 99 mmol/L (ref 98–111)
Creatinine, Ser: 0.69 mg/dL (ref 0.44–1.00)
GFR, Estimated: >60 mL/min (ref >60)
Glucose, Bld: 93 mg/dL (ref 70–99)
Potassium: 2.7 mmol/L — CL (ref 3.5–5.1)
Sodium: 134 mmol/L — ABNORMAL LOW (ref 135–145)
Total Bilirubin: 1.4 mg/dL — ABNORMAL HIGH (ref 0.0–1.2)
Total Protein: 7.2 g/dL (ref 6.5–8.1)

## 2023-10-22 LAB — MAGNESIUM: Magnesium: 1.8 mg/dL (ref 1.7–2.4)

## 2023-10-22 LAB — BASIC METABOLIC PANEL WITH GFR
Anion gap: 13 (ref 5–15)
BUN: 5 mg/dL — ABNORMAL LOW (ref 6–20)
CO2: 19 mmol/L — ABNORMAL LOW (ref 22–32)
Calcium: 8 mg/dL — ABNORMAL LOW (ref 8.9–10.3)
Chloride: 104 mmol/L (ref 98–111)
Creatinine, Ser: 0.65 mg/dL (ref 0.44–1.00)
GFR, Estimated: >60 mL/min (ref >60)
Glucose, Bld: 88 mg/dL (ref 70–99)
Potassium: 4.9 mmol/L (ref 3.5–5.1)
Sodium: 136 mmol/L (ref 135–145)

## 2023-10-22 LAB — ETHANOL: Alcohol, Ethyl (B): 332 mg/dL (ref <15)

## 2023-10-22 LAB — ACETAMINOPHEN LEVEL
Acetaminophen (Tylenol), Serum: <10 ug/mL — ABNORMAL LOW (ref 10–30)
Acetaminophen (Tylenol), Serum: <10 ug/mL — ABNORMAL LOW (ref 10–30)

## 2023-10-22 MED ORDER — SODIUM CHLORIDE 0.9 % IV BOLUS
1000.0000 mL | Freq: Once | INTRAVENOUS | Status: AC
  Administered 2023-10-22: 1000 mL via INTRAVENOUS

## 2023-10-22 MED ORDER — THIAMINE MONONITRATE 100 MG PO TABS
100.0000 mg | ORAL_TABLET | Freq: Every day | ORAL | Status: DC
  Administered 2023-10-23 – 2023-10-24 (×2): 100 mg via ORAL
  Filled 2023-10-22 (×2): qty 1

## 2023-10-22 MED ORDER — LORAZEPAM 1 MG PO TABS
0.0000 mg | ORAL_TABLET | Freq: Four times a day (QID) | ORAL | Status: DC
  Administered 2023-10-23: 1 mg via ORAL
  Administered 2023-10-23: 2 mg via ORAL
  Filled 2023-10-22: qty 1
  Filled 2023-10-22: qty 2

## 2023-10-22 MED ORDER — POTASSIUM CHLORIDE CRYS ER 20 MEQ PO TBCR: 40.0000 meq | EXTENDED_RELEASE_TABLET | Freq: Once | ORAL | Status: DC

## 2023-10-22 MED ORDER — THIAMINE HCL 100 MG/ML IJ SOLN: 100.0000 mg | Freq: Every day | INTRAMUSCULAR | Status: DC

## 2023-10-22 MED ORDER — MAGNESIUM SULFATE 50 % IJ SOLN: 1.0000 g | Freq: Once | INTRAMUSCULAR | Status: DC

## 2023-10-22 MED ORDER — POTASSIUM CHLORIDE CRYS ER 20 MEQ PO TBCR
40.0000 meq | EXTENDED_RELEASE_TABLET | Freq: Once | ORAL | Status: AC
  Administered 2023-10-22: 40 meq via ORAL
  Filled 2023-10-22: qty 2

## 2023-10-22 MED ORDER — POTASSIUM CHLORIDE 10 MEQ/100ML IV SOLN
10.0000 meq | INTRAVENOUS | Status: AC
  Administered 2023-10-22: 10 meq via INTRAVENOUS
  Filled 2023-10-22: qty 100

## 2023-10-22 MED ORDER — MAGNESIUM SULFATE IN D5W 1-5 GM/100ML-% IV SOLN
1.0000 g | Freq: Once | INTRAVENOUS | Status: AC
  Administered 2023-10-22: 1 g via INTRAVENOUS
  Filled 2023-10-22: qty 100

## 2023-10-22 MED ORDER — LORAZEPAM 2 MG/ML IJ SOLN: 0.0000 mg | Freq: Four times a day (QID) | INTRAMUSCULAR | Status: DC

## 2023-10-22 MED ORDER — LORAZEPAM 2 MG/ML IJ SOLN: 0.0000 mg | Freq: Two times a day (BID) | INTRAMUSCULAR | Status: DC

## 2023-10-22 MED ORDER — LORAZEPAM 1 MG PO TABS: 0.0000 mg | ORAL_TABLET | Freq: Two times a day (BID) | ORAL | Status: DC

## 2023-10-22 NOTE — ED Triage Notes (Signed)
 Pt BIB EMS from home d/t intentional overdose on hydroxyzine . Pt cites 60 tablets, 25 mg each and 2 beers; no other substances. Mother found her and reports hx of chronic ETOH abuse. This is supposedly her first suicide attempt. Hx of anxiety. AO4 but somnolent. No current vomiting. EMS has called poison control.   EMS vitals  100/60(73) HR 86 SPO2 95 RA (put on 2L, now 99%)  CBG 111   Current bp 96/56 (67)

## 2023-10-22 NOTE — ED Provider Notes (Addendum)
 [place] EMERGENCY DEPARTMENT AT [facility] Provider Note   CSN: [id number] Arrival date & time: 10/22/23  1601     Patient presents with: No chief complaint on file.   Charlene [name] is a 53 y.o. female.   Patient here after a suicide attempt where she took 60 tablets of 25 mg hydroxyzine .  She admits alcohol use as well.  She is tearful on exam.  Normal vitals with EMS.  She has a history of anxiety but no major mental health problems otherwise.  Sounds like maybe history of alcohol abuse.  She will not answer any questions about why she did this.  She denies any chest pain shortness of breath abdominal pain nausea or vomiting.  But she was little nauseous with EMS.  The history is provided by the patient.       Prior to Admission medications   Medication Sig Start Date End Date Taking? Authorizing Provider  acetaminophen  (TYLENOL ) 500 MG tablet Take 2 tablets (1,000 mg total) by mouth every 6 (six) hours as needed. 11/18/19   Kandyce Sor, MD  amLODipine (NORVASC) 5 MG tablet Take 5 mg by mouth daily. 02/20/19   [provider]  buPROPion  (WELLBUTRIN  XL) 150 MG 24 hr tablet TAKE 1 TABLET BY MOUTH EVERY DAY 02/12/22   Mozingo, Regina Nattalie, NP  cholecalciferol  (VITAMIN D3) 25 MCG (1000 UNIT) tablet Take 1,000 Units by mouth daily.    [provider]  cyanocobalamin (,VITAMIN B-12,) 1000 MCG/ML injection Inject 1,000 mcg into the muscle every 30 (thirty) days.  05/09/19   [provider]  hydrochlorothiazide (MICROZIDE) 12.5 MG capsule Take 12.5 mg by mouth daily.    [provider]  hydrOXYzine  (ATARAX ) 25 MG tablet TAKE 1 TO 2 TABLETS BY MOUTH AT BEDTIME 10/16/22   Mozingo, Regina Nattalie, NP  ibuprofen  (ADVIL ) 800 MG tablet Take 1 tablet (800 mg total) by mouth every 8 (eight) hours as needed for moderate pain. 11/18/19   Kandyce Sor, MD  lamoTRIgine  (LAMICTAL ) 200 MG tablet Take one tablet at bedtime. 09/12/22   Mozingo,  Regina Nattalie, NP  levothyroxine  (SYNTHROID ) 75 MCG tablet Take 75 mcg by mouth daily before breakfast.  05/20/19   [provider]  Potassium 99 MG TABS Take 99 mg by mouth every other day.    [provider]  Vitamin D , Ergocalciferol , (DRISDOL) 50000 units CAPS capsule Take 1 capsule once a week by mouth.  Patient not taking: Reported on 11/06/2019    [provider]    Allergies: Penicillins and Shellfish allergy    Review of Systems  Updated Vital Signs BP 130/67   Pulse 68   Temp 98.5 F (36.9 C)   Resp 18   Ht 5' 9 (1.753 m)   Wt 127.9 kg   LMP 09/17/2019   SpO2 98%   BMI 41.64 kg/m   Physical Exam Vitals and nursing note reviewed.  Constitutional:      General: She is not in acute distress.    Appearance: She is well-developed. She is not ill-appearing.  HENT:     Head: Normocephalic and atraumatic.     Nose: Nose normal.     Mouth/Throat:     Mouth: Mucous membranes are moist.  Eyes:     Extraocular Movements: Extraocular movements intact.     Conjunctiva/sclera: Conjunctivae normal.     Pupils: Pupils are equal, round, and reactive to light.  Cardiovascular:     Rate and Rhythm: Normal rate and  regular rhythm.     Pulses: Normal pulses.     Heart sounds: Normal heart sounds. No murmur heard. Pulmonary:     Effort: Pulmonary effort is normal. No respiratory distress.     Breath sounds: Normal breath sounds.  Abdominal:     General: Abdomen is flat.     Palpations: Abdomen is soft.     Tenderness: There is no abdominal tenderness.  Musculoskeletal:        General: No swelling.     Cervical back: Normal range of motion and neck supple.  Skin:    General: Skin is warm and dry.     Capillary Refill: Capillary refill takes less than 2 seconds.  Neurological:     Mental Status: She is alert.  Psychiatric:     Comments: Tearful emotional admits to feeling suicidal     (all labs ordered are listed, but only abnormal results  are displayed) Labs Reviewed  COMPREHENSIVE METABOLIC PANEL WITH GFR - Abnormal; Notable for the following components:      Result Value   Sodium 134 (*)    Potassium 2.7 (*)    CO2 18 (*)    Calcium 8.4 (*)    AST 274 (*)    ALT 77 (*)    Total Bilirubin 1.4 (*)    Anion gap 17 (*)    All other components within normal limits  ETHANOL - Abnormal; Notable for the following components:   Alcohol, Ethyl (B) 332 (*)    All other components within normal limits  CBC WITH DIFFERENTIAL/PLATELET - Abnormal; Notable for the following components:   WBC 3.6 (*)    Hemoglobin 10.8 (*)    HCT 32.9 (*)    MCV 74.6 (*)    MCH 24.5 (*)    RDW 22.4 (*)    Platelets 122 (*)    Neutro Abs 1.0 (*)    All other components within normal limits  SALICYLATE LEVEL - Abnormal; Notable for the following components:   Salicylate Lvl <7.0 (*)    All other components within normal limits  ACETAMINOPHEN  LEVEL - Abnormal; Notable for the following components:   Acetaminophen  (Tylenol ), Serum <10 (*)    All other components within normal limits  BASIC METABOLIC PANEL WITH GFR - Abnormal; Notable for the following components:   CO2 19 (*)    BUN 5 (*)    Calcium 8.0 (*)    All other components within normal limits  ACETAMINOPHEN  LEVEL - Abnormal; Notable for the following components:   Acetaminophen  (Tylenol ), Serum <10 (*)    All other components within normal limits  URINE DRUG SCREEN  MAGNESIUM  MAGNESIUM    EKG: EKG Interpretation Date/Time:  Monday October 22 2023 22:36:58 EDT Ventricular Rate:  83 PR Interval:  181 QRS Duration:  90 QT Interval:  494 QTC Calculation: 581 R Axis:   13  Text Interpretation: Sinus rhythm Abnormal R-wave progression, early transition Confirmed by Ruthe Cornet [id number]) on 10/22/2023 11:22:54 PM  Radiology: No results found.   Procedures   Medications Ordered in the ED  potassium chloride 10 mEq in 100 mL IVPB (0 mEq Intravenous Stopped 10/22/23 2000)   LORazepam  (ATIVAN ) injection 0-4 mg ( Intravenous Not Given 10/22/23 2238)    Or  LORazepam  (ATIVAN ) tablet 0-4 mg ( Oral See Alternative 10/22/23 2238)  LORazepam  (ATIVAN ) injection 0-4 mg (has no administration in time range)    Or  LORazepam  (ATIVAN ) tablet 0-4 mg (has no administration in time  range)  thiamine (VITAMIN B1) tablet 100 mg (has no administration in time range)    Or  thiamine (VITAMIN B1) injection 100 mg (has no administration in time range)  magnesium sulfate IVPB 1 g 100 mL (has no administration in time range)  sodium chloride  0.9 % bolus 1,000 mL (0 mLs Intravenous Stopped 10/22/23 1820)  potassium chloride SA (KLOR-CON M) CR tablet 40 mEq (40 mEq Oral Given 10/22/23 2045)  magnesium sulfate IVPB 1 g 100 mL (1 g Intravenous New Bag/Given 10/22/23 2201)                                    Medical Decision Making Amount and/or Complexity of Data Reviewed Labs: ordered.  Risk OTC drugs. Prescription drug management.   Jame Seelig is here after suicide attempt.  Took 6025 mg tablets of hydroxyzine  earlier today.  Do not really know when this ingestion occurred.  No prior history of suicide attempt.  History of anxiety.  Otherwise vital signs are unremarkable.  Blood pressure is 96/56.  Poison control was called recommend monitor for 7 to 8 hours.  Watch for anticholinergic effect.  Will check a set of labs now and then repeat a BMP EKG and Tylenol  levels of 4 hours.  Right now she is fairly asymptomatic.  Hemodynamically stable.  IVC has been filled.  Overall lab work shows potassium 2.7.  This is repleted.  Liver enzymes mildly elevated likely in the setting of chronic alcohol abuse.  I have not any abdominal tenderness and no concern for intra-abdominal process.  Urine drug screen is negative.  Alcohol level was elevated at 332.  But she had no significant leukocytosis anemia otherwise.  Tylenol  and salicylate level were normal.  Repeat BMP showed a  potassium of 4.9.  Repeat Tylenol  level was undetectable.  Ultimately she is hemodynamically stable.    Patient handed off with patient pending repeat EKG and magnesium.  Anticipate her to be medically cleared overnight.  Is doing well.  This chart was dictated using voice recognition software.  Despite best efforts to proofread,  errors can occur which can change the documentation meaning.      Final diagnoses:  Suicide attempt [facility])    ED Discharge Orders     None          Ruthe Cornet, DO 10/22/23 2302    Ruthe Cornet, DO 10/22/23 2323    Ruthe Cornet, DO 10/22/23 2331

## 2023-10-22 NOTE — ED Notes (Signed)
EKG done and handed to doctor.

## 2023-10-22 NOTE — ED Notes (Addendum)
 Pt calm, confused, speaking nonsensical, family at [facility].

## 2023-10-22 NOTE — ED Notes (Signed)
 Pt assisted to restroom.

## 2023-10-22 NOTE — ED Notes (Addendum)
 Per poison control Monitor for 7 to 8hrs Anticholinergic effect More mild than benadryl  overdose Tachycardia Hypertension  Repeat q 4hrs Bmp EKG Acetaminophen  levels

## 2023-10-23 ENCOUNTER — Encounter (HOSPITAL_COMMUNITY): Payer: Self-pay | Admitting: Internal Medicine

## 2023-10-23 ENCOUNTER — Other Ambulatory Visit: Payer: Self-pay

## 2023-10-23 DIAGNOSIS — F32A Depression, unspecified: Secondary | ICD-10-CM

## 2023-10-23 DIAGNOSIS — T1491XA Suicide attempt, initial encounter: Principal | ICD-10-CM

## 2023-10-23 DIAGNOSIS — E669 Obesity, unspecified: Secondary | ICD-10-CM

## 2023-10-23 DIAGNOSIS — F1010 Alcohol abuse, uncomplicated: Secondary | ICD-10-CM

## 2023-10-23 DIAGNOSIS — T50902A Poisoning by unspecified drugs, medicaments and biological substances, intentional self-harm, initial encounter: Secondary | ICD-10-CM

## 2023-10-23 DIAGNOSIS — R9431 Abnormal electrocardiogram [ECG] [EKG]: Secondary | ICD-10-CM

## 2023-10-23 DIAGNOSIS — F1090 Alcohol use, unspecified, uncomplicated: Secondary | ICD-10-CM | POA: Insufficient documentation

## 2023-10-23 LAB — COMPREHENSIVE METABOLIC PANEL WITH GFR
ALT: 73 U/L — ABNORMAL HIGH (ref 0–44)
AST: 246 U/L — ABNORMAL HIGH (ref 15–41)
Albumin: 3.8 g/dL (ref 3.5–5.0)
Alkaline Phosphatase: 82 U/L (ref 38–126)
Anion gap: 15 (ref 5–15)
BUN: 5 mg/dL — ABNORMAL LOW (ref 6–20)
CO2: 17 mmol/L — ABNORMAL LOW (ref 22–32)
Calcium: 8.3 mg/dL — ABNORMAL LOW (ref 8.9–10.3)
Chloride: 107 mmol/L (ref 98–111)
Creatinine, Ser: 0.62 mg/dL (ref 0.44–1.00)
GFR, Estimated: >60 mL/min (ref >60)
Glucose, Bld: 70 mg/dL (ref 70–99)
Potassium: 4.2 mmol/L (ref 3.5–5.1)
Sodium: 139 mmol/L (ref 135–145)
Total Bilirubin: 1.7 mg/dL — ABNORMAL HIGH (ref 0.0–1.2)
Total Protein: 6.7 g/dL (ref 6.5–8.1)

## 2023-10-23 LAB — BASIC METABOLIC PANEL WITH GFR
Anion gap: 14 (ref 5–15)
Anion gap: 15 (ref 5–15)
BUN: 6 mg/dL (ref 6–20)
BUN: 8 mg/dL (ref 6–20)
CO2: 20 mmol/L — ABNORMAL LOW (ref 22–32)
CO2: 18 mmol/L — ABNORMAL LOW (ref 22–32)
Calcium: 8.3 mg/dL — ABNORMAL LOW (ref 8.9–10.3)
Calcium: 8.7 mg/dL — ABNORMAL LOW (ref 8.9–10.3)
Chloride: 104 mmol/L (ref 98–111)
Chloride: 105 mmol/L (ref 98–111)
Creatinine, Ser: 0.72 mg/dL (ref 0.44–1.00)
Creatinine, Ser: 0.95 mg/dL (ref 0.44–1.00)
GFR, Estimated: >60 mL/min (ref >60)
GFR, Estimated: >60 mL/min (ref >60)
Glucose, Bld: 85 mg/dL (ref 70–99)
Glucose, Bld: 164 mg/dL — ABNORMAL HIGH (ref 70–99)
Potassium: 3.7 mmol/L (ref 3.5–5.1)
Potassium: 4.3 mmol/L (ref 3.5–5.1)
Sodium: 138 mmol/L (ref 135–145)
Sodium: 138 mmol/L (ref 135–145)

## 2023-10-23 LAB — CBC WITH DIFFERENTIAL/PLATELET
Abs Immature Granulocytes: 0.01 K/uL (ref 0.00–0.07)
Basophils Absolute: 0.1 K/uL (ref 0.0–0.1)
Basophils Relative: 1 %
Eosinophils Absolute: 0.2 K/uL (ref 0.0–0.5)
Eosinophils Relative: 4 %
HCT: 30.6 % — ABNORMAL LOW (ref 36.0–46.0)
Hemoglobin: 10.1 g/dL — ABNORMAL LOW (ref 12.0–15.0)
Immature Granulocytes: 0 %
Lymphocytes Relative: 30 %
Lymphs Abs: 1.1 K/uL (ref 0.7–4.0)
MCH: 24.6 pg — ABNORMAL LOW (ref 26.0–34.0)
MCHC: 33 g/dL (ref 30.0–36.0)
MCV: 74.6 fL — ABNORMAL LOW (ref 80.0–100.0)
Monocytes Absolute: 0.6 K/uL (ref 0.1–1.0)
Monocytes Relative: 15 %
Neutro Abs: 1.9 K/uL (ref 1.7–7.7)
Neutrophils Relative %: 50 %
Platelets: 123 K/uL — ABNORMAL LOW (ref 150–400)
RBC: 4.1 MIL/uL (ref 3.87–5.11)
RDW: 22.9 % — ABNORMAL HIGH (ref 11.5–15.5)
Smear Review: NORMAL
WBC: 3.8 K/uL — ABNORMAL LOW (ref 4.0–10.5)
nRBC: 0 % (ref 0.0–0.2)

## 2023-10-23 LAB — MAGNESIUM
Magnesium: 2.7 mg/dL — ABNORMAL HIGH (ref 1.7–2.4)
Magnesium: 2.2 mg/dL (ref 1.7–2.4)
Magnesium: 2.1 mg/dL (ref 1.7–2.4)

## 2023-10-23 MED ORDER — ACETAMINOPHEN 325 MG PO TABS
650.0000 mg | ORAL_TABLET | Freq: Four times a day (QID) | ORAL | Status: DC | PRN
  Administered 2023-10-24: 650 mg via ORAL
  Filled 2023-10-23: qty 2

## 2023-10-23 MED ORDER — ADULT MULTIVITAMIN W/MINERALS CH
1.0000 | ORAL_TABLET | Freq: Every day | ORAL | Status: DC
  Administered 2023-10-23 – 2023-10-24 (×2): 1 via ORAL
  Filled 2023-10-23 (×2): qty 1

## 2023-10-23 MED ORDER — ACETAMINOPHEN 650 MG RE SUPP: 650.0000 mg | Freq: Four times a day (QID) | RECTAL | Status: DC | PRN

## 2023-10-23 MED ORDER — POTASSIUM CHLORIDE CRYS ER 20 MEQ PO TBCR
40.0000 meq | EXTENDED_RELEASE_TABLET | Freq: Once | ORAL | Status: AC
  Administered 2023-10-23: 40 meq via ORAL
  Filled 2023-10-23: qty 2

## 2023-10-23 MED ORDER — POTASSIUM CHLORIDE CRYS ER 20 MEQ PO TBCR
20.0000 meq | EXTENDED_RELEASE_TABLET | Freq: Once | ORAL | Status: AC
  Administered 2023-10-23: 20 meq via ORAL
  Filled 2023-10-23: qty 1

## 2023-10-23 NOTE — ED Notes (Addendum)
 Pt received lunch tray, sitting up in stretcher and talking with visitor.

## 2023-10-23 NOTE — ED Provider Notes (Signed)
 Care assumed from Dr. Ruthe.  Intentional ingestion of hydroxyzine .  QT remains prolonged at 577 and seems to be lengthening.  Potassium and magnesium have been replaced.  Discussed with poison control.  Will plan prolonged medical observation before medical clearance.   Given persistently prolonged QT we will plan medical observation before she is cleared for psychiatry.  Potassium has downtrended again and will be further supplemented.  Potassium was 2.7, she received 20 mill equivalents IV potassium and 40 mill equivalents p.o. potassium. Repeat potassium was 4.9 but is now back to 3.7  Discussed with Dr. Marcene.    Carita Senior, MD 10/23/23 [phone]

## 2023-10-23 NOTE — ED Notes (Addendum)
 Pt was given dinner tray. Sitting on the edge of the bed eating. 1 fork and 1 spoon on tray.

## 2023-10-23 NOTE — ED Notes (Signed)
 Brought pt breakfast tray, along with toothbrush and toothpaste for oral care.

## 2023-10-23 NOTE — Assessment & Plan Note (Addendum)
 Patient self-reports regular alcohol use including 2-3 beers daily as well as intermittent liquor intake Alcohol level 332. Will start on CIWA protocol Multivitamin supplementation Monitor

## 2023-10-23 NOTE — Assessment & Plan Note (Signed)
 Noted QT prolongation with QTc in the 570s in the setting of intentional overdose with hydroxyzine  and hypokalemia Case discussed with poison control with recommendations for maintaining potassium greater than 4 and magnesium greater than 2 and goal QTc of less than 500 ms Continue with telemetry monitoring as well as serial EKGs Most recent K4.2 and magnesium 2.2 at 0630 today  Monitor closely

## 2023-10-23 NOTE — H&P (Signed)
 History and Physical    Patient: Charlene [name] FMW:[id number] DOB: [birth year] DOA: 10/22/2023 DOS: the patient was seen and examined on 10/23/2023 PCP: Verena Mems, MD  Patient coming from: Home  Chief Complaint: No chief complaint on file.  HPI: Charlene [name] is a 53 y.o. female with medical history significant of obesity, hypertension, alcohol abuse, depression presenting with intentional overdose, QT prolongation.  Patient reports ingesting roughly 40 to 50 pills of hydroxyzine  yesterday.  No regular alcohol intake.  Drinking roughly 3+ beers daily as well as intermittent hard liquor intake.  Patient states she has a baseline history of chronic depression.  Has been out of medication secondary to job loss for multiple months.  Denies any known history of HI/SI in the past.  No chest pain or shortness of breath.  Chills.  No abdominal pain.  Positive mild nausea.  Patient noted to have been found by her mother after the event at home.  Presented to the ER afebrile, hemodynamically stable.  Satting well on room air.  White count 3.6, hemoglobin 10.8, platelets 123.  Creatinine 0.62.  Salicylate, Tylenol  level as well as drug screen all within normal limits.  Alcohol level 332.  Initial EKG with QTc in the 570s.  Case discussed with poison control with recommendation of keeping potassium greater than 4 and magnesium greater than 2 in the setting of prolonged QT.    Review of Systems: As mentioned in the history of present illness. All other systems reviewed and are negative. Past Medical History:  Diagnosis Date   Hypertension    Lactating mother 07/13/2010   No pertinent past medical history    Past Surgical History:  Procedure Laterality Date   GASTRIC BYPASS  2000   HERNIA REPAIR  2004   ROBOTIC ASSISTED LAPAROSCOPIC HYSTERECTOMY AND SALPINGECTOMY Bilateral 11/17/2019   Procedure: XI ROBOTIC ASSISTED LAPAROSCOPIC HYSTERECTOMY AND BILATERAL SALPINGECTOMY;  Surgeon: Kandyce Sor, MD;  Location: [facility];  Service: Gynecology;  Laterality: Bilateral;   VENTRAL HERNIA REPAIR  2004   Social History:  reports that she has never smoked. She has never used smokeless tobacco. She reports current alcohol use. She reports that she does not use drugs.  Allergies  Allergen Reactions   Penicillins Hives and Shortness Of Breath    Heart rate increases   Shellfish Allergy Anaphylaxis    Swells up face Swells up face    Family History  Problem Relation Age of Onset   Hypertension Mother    Heart disease Father    Hypertension Father     Prior to Admission medications   Medication Sig Start Date End Date Taking? Authorizing Provider  acetaminophen  (TYLENOL ) 500 MG tablet Take 2 tablets (1,000 mg total) by mouth every 6 (six) hours as needed. Patient not taking: Reported on 10/23/2023 11/18/19   Kandyce Sor, MD  amLODipine (NORVASC) 5 MG tablet Take 5 mg by mouth daily. Patient not taking: Reported on 10/23/2023 02/20/19   [provider]  buPROPion  (WELLBUTRIN  XL) 150 MG 24 hr tablet TAKE 1 TABLET BY MOUTH EVERY DAY Patient not taking: Reported on 10/23/2023 02/12/22   Mozingo, Regina Nattalie, NP  cholecalciferol  (VITAMIN D3) 25 MCG (1000 UNIT) tablet Take 1,000 Units by mouth daily. Patient not taking: Reported on 10/23/2023    [provider]  cyanocobalamin (,VITAMIN B-12,) 1000 MCG/ML injection Inject 1,000 mcg into the muscle every 30 (thirty) days.  Patient not taking: Reported on 10/23/2023 05/09/19   [provider]  hydrochlorothiazide (  MICROZIDE) 12.5 MG capsule Take 12.5 mg by mouth daily. Patient not taking: Reported on 10/23/2023    [provider]  hydrOXYzine  (ATARAX ) 25 MG tablet TAKE 1 TO 2 TABLETS BY MOUTH AT BEDTIME Patient not taking: Reported on 10/23/2023 10/16/22   Mozingo, Regina Nattalie, NP  ibuprofen  (ADVIL ) 800 MG tablet Take 1 tablet (800 mg total) by mouth every 8 (eight) hours as  needed for moderate pain. Patient not taking: Reported on 10/23/2023 11/18/19   Kandyce Sor, MD  lamoTRIgine  (LAMICTAL ) 200 MG tablet Take one tablet at bedtime. Patient not taking: Reported on 10/23/2023 09/12/22   Mozingo, Regina Nattalie, NP  levothyroxine  (SYNTHROID ) 75 MCG tablet Take 75 mcg by mouth daily before breakfast.  Patient not taking: Reported on 10/23/2023 05/20/19   [provider]  Potassium 99 MG TABS Take 99 mg by mouth every other day. Patient not taking: Reported on 10/23/2023    [provider]  Vitamin D , Ergocalciferol , (DRISDOL) 50000 units CAPS capsule Take 1 capsule once a week by mouth.  Patient not taking: Reported on 11/06/2019    [provider]    Physical Exam: Vitals:   10/22/23 1718 10/22/23 2019 10/23/23 0518 10/23/23 0901  BP: (!) 131/97 130/67 138/78 136/76  Pulse: 92 68 77 91  Resp: 20 18 20 20   Temp: (!) 97.5 F (36.4 C) 98.5 F (36.9 C) 98.5 F (36.9 C) 98.3 F (36.8 C)  TempSrc: Axillary   Oral  SpO2: 100% 98% 96% 95%  Weight:      Height:       Physical Exam Constitutional:      Appearance: She is obese.  HENT:     Head: Normocephalic and atraumatic.     Nose: Nose normal.     Mouth/Throat:     Mouth: Mucous membranes are moist.  Eyes:     Pupils: Pupils are equal, round, and reactive to light.  Cardiovascular:     Rate and Rhythm: Normal rate and regular rhythm.  Pulmonary:     Effort: Pulmonary effort is normal.  Abdominal:     General: Bowel sounds are normal.  Musculoskeletal:        General: Normal range of motion.  Skin:    General: Skin is warm.  Neurological:     General: No focal deficit present.  Psychiatric:        Mood and Affect: Mood normal.     Data Reviewed:  There are no new results to review at this time.  DG Hip Port Unilat W or [place] Pelvis 1 View Left CLINICAL DATA:  Clemens 2 weeks ago with continued left hip pain.  EXAM: DG HIP (WITH OR WITHOUT PELVIS) 1V PORT  LEFT  COMPARISON:  None Available.  FINDINGS: There is no evidence of hip fracture or dislocation. There is mild symmetric joint space loss and osteophytosis both hips. No pelvic fracture or diastasis is seen. Area soft tissues are unremarkable.  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  Electronically Signed   By: Francis Quam M.D.   On: 10/07/2021 06:07 CT HEAD WO CONTRAST CLINICAL DATA:  Fall several days ago. Awoke with left lower extremity numbness  EXAM: CT HEAD WITHOUT CONTRAST  TECHNIQUE: Contiguous axial images were obtained from the base of the skull through the vertex without intravenous contrast.  RADIATION DOSE REDUCTION: This exam was performed according to the departmental dose-optimization program which includes automated exposure control, adjustment of the mA and/or kV according to patient  size and/or use of iterative reconstruction technique.  COMPARISON:  None Available.  FINDINGS: Brain: No evidence of acute infarction, hemorrhage, hydrocephalus, extra-axial collection or mass lesion/mass effect.  Vascular: No hyperdense vessel or unexpected calcification.  Skull: Normal. Negative for fracture or focal lesion.  Sinuses/Orbits: No acute finding.  IMPRESSION: Negative head CT.  Electronically Signed   By: Dorn Roulette M.D.   On: 10/07/2021 05:14  Lab Results  Component Value Date   WBC 3.8 (L) 10/23/2023   HGB 10.1 (L) 10/23/2023   HCT 30.6 (L) 10/23/2023   MCV 74.6 (L) 10/23/2023   PLT 123 (L) 10/23/2023   Last metabolic panel Lab Results  Component Value Date   GLUCOSE 70 10/23/2023   NA 139 10/23/2023   K 4.2 10/23/2023   CL 107 10/23/2023   CO2 17 (L) 10/23/2023   BUN 5 (L) 10/23/2023   CREATININE 0.62 10/23/2023   GFRNONAA >60 10/23/2023   CALCIUM 8.3 (L) 10/23/2023   PROT 6.7 10/23/2023   ALBUMIN  3.8 10/23/2023   BILITOT 1.7 (H) 10/23/2023   ALKPHOS 82 10/23/2023   AST 246 (H) 10/23/2023   ALT 73  (H) 10/23/2023   ANIONGAP 15 10/23/2023    Assessment and Plan: * QT prolongation Noted QT prolongation with QTc in the 570s in the setting of intentional overdose with hydroxyzine  and hypokalemia Case discussed with poison control with recommendations for maintaining potassium greater than 4 and magnesium greater than 2 and goal QTc of less than 500 ms Continue with telemetry monitoring as well as serial EKGs Most recent K4.2 and magnesium 2.2 at 0630 today  Monitor closely     Suicide attempt [facility]) Intentional overdose Noted prior suicide attempt with ingestion of roughly 40 to 50 pills of hydroxyzine  at home in the setting of baseline depression no longer on treatment Patient IVC by psychiatry Will monitor electrolytes per poison control protocol Follow up [facility] recommendations  Monitor  Alcohol abuse Patient self-reports regular alcohol use including 2-3 beers daily as well as intermittent liquor intake Will start on CIWA protocol Multivitamin supplementation Monitor       Advance Care Planning:   Code Status: Full Code   Consults: BH   Family Communication: No family at the bedside   Severity of Illness: The appropriate patient status for this patient is OBSERVATION. Observation status is judged to be reasonable and necessary in order to provide the required intensity of service to ensure the patient's safety. The patient's presenting symptoms, physical exam findings, and initial radiographic and laboratory data in the context of their medical condition is felt to place them at decreased risk for further clinical deterioration. Furthermore, it is anticipated that the patient will be medically stable for discharge from the hospital within 2 midnights of admission.   Author: Elspeth JINNY Masters, MD 10/23/2023 12:44 PM  For on call review [email or web address].

## 2023-10-23 NOTE — Consult Note (Signed)
 [facility] Consult Note  Patient Name: Charlene [name] MRN: [id number] DOB: Feb 19, 1970 DATE OF Consult: 10/23/2023  PRIMARY PSYCHIATRIC DIAGNOSES   1.  Major Depression, Recurrent, Severe without Psychotic Features 2.  Alcohol Use Disorder, Severe   RECOMMENDATIONS  Recommendations: Medication recommendations: Would not start any psychotropic medication at this point, given her continuing QTC issues.  Continue with CIWA protocol for withdrawal sx's. Non-Medication/therapeutic recommendations: Patient seems to minimize some the severity of her current overdose. She will require suicide precautions while she is in medical observation.  Continue with matter-of-fact emotional support in ED, pending transfer to psychiatry.   Is inpatient psychiatric hospitalization recommended for this patient? Yes (Explain why): Patient is quite depressed and took a significant, intentional overdose of hydroxyzine  earlier today, with family history of suicide Is another care setting recommended for this patient? (examples may include Crisis Stabilization Unit, Residential/Recovery Treatment, ALF/SNF, Memory Care Unit)  No (Explain why): As above From a psychiatric perspective, is this patient appropriate for discharge to an outpatient setting/resource or other less restrictive environment for continued care?  No (Explain why): As above Follow-Up Telepsychiatry C/L services: We will sign off for now. Please re-consult our service if needed for any concerning changes in the patient's condition, discharge planning, or questions. Communication: Treatment team members (and family members if applicable) who were involved in treatment/care discussions and planning, and with whom we spoke or engaged with via secure text/chat, include the following: Secure message sent to Dr. Marcene, ED attending, and staff, outlining recommendations.  Patient is not yet medically cleared, and will be admitted for observation.   Recommend another referral to [facility] during the day to monitor her progress.  Thank you for involving us  in the care of this patient. If you have any additional questions or concerns, please call [phone] and ask for the provider on-call.   TELEPSYCHIATRY ATTESTATION & CONSENT   As the provider for this telehealth consult, I attest that I verified the patient's identity using two separate identifiers, introduced myself to the patient, provided my credentials, disclosed my location, and performed this encounter via a HIPAA-compliant, real-time, face-to-face, two-way, interactive audio and video platform and with the full consent and agreement of the patient (or guardian as applicable.)   Patient physical location: [facility]. Telehealth provider physical location: home office in state of [place] .  Video start time: 0415h EDT  Video end time: 0430h EDT   Total time spent in this encounter was 30 minutes, including record review, clinical interview, behavior observations, discussion of impressions and recommendations (including medications and hospitalization), and consultation/communication with relevant parties   IDENTIFYING DATA  Charlene [name] is a 53 y.o. year-old female for whom a psychiatric consultation has been ordered by the primary provider. The patient was identified using two separate identifiers.  CHIEF COMPLAINT/REASON FOR CONSULT   I'm just overwhelmed, and I can't take it   HISTORY OF PRESENT ILLNESS (HPI)  The patient reports that she has been struggling with depression since her teen years, but it has especially been bad over the past ten years.  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  Has two teen daughters who are having behavioral and mood issues. Also lives with her  mother, who has struggled with depression.  In addition, her sister killed herself  in 2007 by walking into traffic.  Today, patient felt overwhelmed and unable to look to the future, and overdosed on a significant supply of hydroxyzine .  She still is not stable from a medical standpoint, and she is being monitored for cardiac effects.  Has been drinking daily (at least five beers or several glasses of wine) for at least the past few months, and has long struggled with alcohol use disorder.  Denies other drugs.  BAL 332 on admission.  UDS negative.  Patient denies history of severe withdrawal sx's.  No homicidal ideation.  No psychotic sx's.     PAST PSYCHIATRIC HISTORY  As above Otherwise as per HPI above.  PAST MEDICAL HISTORY  Past Medical History:  Diagnosis Date   Hypertension    Lactating mother 07/13/2010   No pertinent past medical history      HOME MEDICATIONS  Facility Ordered Medications  Medication   [COMPLETED] sodium chloride  0.9 % bolus 1,000 mL   [EXPIRED] potassium chloride 10 mEq in 100 mL IVPB   [COMPLETED] potassium chloride SA (KLOR-CON M) CR tablet 40 mEq   [COMPLETED] magnesium sulfate IVPB 1 g 100 mL   LORazepam  (ATIVAN ) injection 0-4 mg   Or   LORazepam  (ATIVAN ) tablet 0-4 mg   [START ON 10/25/2023] LORazepam  (ATIVAN ) injection 0-4 mg   Or   [START ON 10/25/2023] LORazepam  (ATIVAN ) tablet 0-4 mg   thiamine (VITAMIN B1) tablet 100 mg   Or   thiamine (VITAMIN B1) injection 100 mg   [COMPLETED] magnesium sulfate IVPB 1 g 100 mL   [COMPLETED] potassium chloride SA (KLOR-CON M) CR tablet 40 mEq   acetaminophen  (TYLENOL ) tablet 650 mg   Or   acetaminophen  (TYLENOL ) suppository 650 mg   PTA Medications  Medication Sig   hydrochlorothiazide (MICROZIDE) 12.5 MG capsule Take 12.5 mg by mouth daily. (Patient not taking: Reported on 10/23/2023)   Vitamin D , Ergocalciferol , (DRISDOL) 50000 units CAPS capsule Take 1 capsule once a week by mouth.  (Patient not  taking: Reported on 11/06/2019)   levothyroxine  (SYNTHROID ) 75 MCG tablet Take 75 mcg by mouth daily before breakfast.  (Patient not taking: Reported on 10/23/2023)   cyanocobalamin (,VITAMIN B-12,) 1000 MCG/ML injection Inject 1,000 mcg into the muscle every 30 (thirty) days.  (Patient not taking: Reported on 10/23/2023)   amLODipine (NORVASC) 5 MG tablet Take 5 mg by mouth daily. (Patient not taking: Reported on 10/23/2023)   cholecalciferol  (VITAMIN D3) 25 MCG (1000 UNIT) tablet Take 1,000 Units by mouth daily. (Patient not taking: Reported on 10/23/2023)   Potassium 99 MG TABS Take 99 mg by mouth every other day. (Patient not taking: Reported on 10/23/2023)   acetaminophen  (TYLENOL ) 500 MG tablet Take 2 tablets (1,000 mg total) by mouth every 6 (six) hours as needed. (Patient not taking: Reported on 10/23/2023)   ibuprofen  (ADVIL ) 800 MG tablet Take 1 tablet (800 mg total) by mouth every 8 (eight) hours as needed for moderate pain. (Patient not taking: Reported on 10/23/2023)   buPROPion  (WELLBUTRIN  XL) 150 MG 24 hr tablet TAKE 1 TABLET BY MOUTH EVERY DAY (Patient not taking: Reported on 10/23/2023)   lamoTRIgine  (LAMICTAL ) 200 MG tablet Take one tablet at bedtime. (Patient not taking: Reported on 10/23/2023)   hydrOXYzine  (ATARAX ) 25 MG tablet TAKE 1 TO 2 TABLETS BY MOUTH AT BEDTIME (Patient not taking: Reported on 10/23/2023)     ALLERGIES  Allergies  Allergen Reactions   Penicillins Hives and Shortness Of Breath    Heart rate  increases   Shellfish Allergy Anaphylaxis    Swells up face Swells up face    SOCIAL & SUBSTANCE USE HISTORY  Social History   Socioeconomic History   Marital status: Divorced    Spouse name: Not on file   Number of children: Not on file   Years of education: Not on file   Highest education level: Not on file  Occupational History   Not on file  Tobacco Use   Smoking status: Never   Smokeless tobacco: Never  Vaping Use   Vaping status: Never Used   Substance and Sexual Activity   Alcohol use: Yes    Comment: social drinker   Drug use: No   Sexual activity: Yes    Birth control/protection: I.U.D.  Other Topics Concern   Not on file  Social History Narrative   Not on file   Social Drivers of Health   Financial Resource Strain: Not on file  Food Insecurity: Not on file  Transportation Needs: Not on file  Physical Activity: Not on file  Stress: Not on file (02/11/2019)  Social Connections: Unknown (05/13/2021)   Received from [facility]   Social Network    Social Network: Not on file   Social History   Tobacco Use  Smoking Status Never  Smokeless Tobacco Never   Social History   Substance and Sexual Activity  Alcohol Use Yes   Comment: social drinker   Social History   Substance and Sexual Activity  Drug Use No    .  FAMILY HISTORY  Family History  Problem Relation Age of Onset   Hypertension Mother    Heart disease Father    Hypertension Father    Family Psychiatric History (if known):  Mother and sister with depression. Father and sister with substance abuse.  Sister killed herself by walking into traffic in 2007.   MENTAL STATUS EXAM (MSE)  Mental Status Exam: General Appearance: Fairly Groomed  Orientation:  Full (Time, Place, and Person)  Memory:  Immediate;   Fair Recent;   Fair Remote;   Fair  Concentration:  Concentration: Poor and Attention Span: Poor  Recall:  Fair  Attention  Poor  Eye Contact:  Minimal  Speech:  Slow  Language:  Good  Volume:  Decreased  Mood: I'm not doing well  Affect:  Depressed and Flat  Thought Process:  Descriptions of Associations: Circumstantial  Thought Content:  Rumination  Suicidal Thoughts:  Yes.  with intent/plan  Homicidal Thoughts:  No  Judgement:  Impaired  Insight:  Shallow  Psychomotor Activity:  Decreased  Akathisia:  NA  Fund of Knowledge:  Fair    Assets:  Manufacturing systems engineer Desire for Improvement Housing Social Support  Cognition:   WNL  ADL's:  Intact  AIMS (if indicated):       VITALS  Blood pressure 130/67, pulse 68, temperature 98.5 F (36.9 C), resp. rate 18, height 5' 9 (1.753 m), weight 127.9 kg, last menstrual period 09/17/2019, SpO2 98%.  LABS  Admission on 10/22/2023  Component Date Value Ref Range Status   Sodium 10/22/2023 134 (L)  135 - 145 mmol/L Final   Potassium 10/22/2023 2.7 (LL)  3.5 - 5.1 mmol/L Final   Critical Value, Read Back and verified with LAJUANA BROCKS RN AT 1734 ON 10/22/2023 BY PRUDY, K   Chloride 10/22/2023 99  98 - 111 mmol/L Final   CO2 10/22/2023 18 (L)  22 - 32 mmol/L Final   Glucose, Bld 10/22/2023 93  70 -  99 mg/dL Final   Glucose reference range applies only to samples taken after fasting for at least 8 hours.   BUN 10/22/2023 6  6 - 20 mg/dL Final   Creatinine, Ser 10/22/2023 0.69  0.44 - 1.00 mg/dL Final   Calcium 89/79/7974 8.4 (L)  8.9 - 10.3 mg/dL Final   Total Protein 89/79/7974 7.2  6.5 - 8.1 g/dL Final   Albumin  10/22/2023 4.1  3.5 - 5.0 g/dL Final   AST 89/79/7974 274 (H)  15 - 41 U/L Final   ALT 10/22/2023 77 (H)  0 - 44 U/L Final   Alkaline Phosphatase 10/22/2023 101  38 - 126 U/L Final   Total Bilirubin 10/22/2023 1.4 (H)  0.0 - 1.2 mg/dL Final   GFR, Estimated 10/22/2023 >60  >60 mL/min Final   Comment: (NOTE) Calculated using the CKD-EPI Creatinine Equation (2021)    Anion gap 10/22/2023 17 (H)  5 - 15 Final   Performed at [facility], [address], [place], [place] [address]   Alcohol, Ethyl (B) 10/22/2023 332 (HH)  <15 mg/dL Final   Comment: Critical Value, Read Back and verified with LAJUANA BROCKS RN AT 1734 ON 10/22/2023 BY PRUDY, K (NOTE) For medical purposes only. Performed at [facility], [address] [place], [place] [address]    Opiates 10/22/2023 NEGATIVE  NEGATIVE Final   Cocaine 10/22/2023 NEGATIVE  NEGATIVE Final   Benzodiazepines 10/22/2023 NEGATIVE  NEGATIVE Final   Amphetamines 10/22/2023 NEGATIVE   NEGATIVE Final   Tetrahydrocannabinol 10/22/2023 NEGATIVE  NEGATIVE Final   Barbiturates 10/22/2023 NEGATIVE  NEGATIVE Final   Methadone Scn, Ur 10/22/2023 NEGATIVE  NEGATIVE Final   Fentanyl  10/22/2023 NEGATIVE  NEGATIVE Final   Comment: (NOTE) Drug screen is for Medical Purposes only. Positive results are preliminary only. If confirmation is needed, notify lab within 5 days.  Drug Class                 Cutoff (ng/mL) Amphetamine and metabolites 1000 Barbiturate and metabolites 200 Benzodiazepine              200 Opiates and metabolites     300 Cocaine and metabolites     300 THC                         50 Fentanyl                     5 Methadone                   300  Trazodone is metabolized in vivo to several metabolites,  including pharmacologically active m-CPP, which is excreted in the  urine.  Immunoassay screens for amphetamines and MDMA have potential  cross-reactivity with these compounds and may provide false positive  result.  Performed at [facility], [address] [place], [place] [address]    WBC 10/22/2023 3.6 (L)  4.0 - 10.5 K/uL Final   RBC 10/22/2023 4.41  3.87 - 5.11 MIL/uL Final   Hemoglobin 10/22/2023 10.8 (L)  12.0 - 15.0 g/dL Final   HCT 89/79/7974 32.9 (L)  36.0 - 46.0 % Final   MCV 10/22/2023 74.6 (L)  80.0 - 100.0 fL Final   MCH 10/22/2023 24.5 (L)  26.0 - 34.0 pg Final   MCHC 10/22/2023 32.8  30.0 - 36.0 g/dL Final   RDW 89/79/7974 22.4 (H)  11.5 - 15.5 % Final   Platelets  10/22/2023 122 (L)  150 - 400 K/uL Final   nRBC 10/22/2023 0.0  0.0 - 0.2 % Final   Neutrophils Relative % 10/22/2023 27  % Final   Neutro Abs 10/22/2023 1.0 (L)  1.7 - 7.7 K/uL Final   Lymphocytes Relative 10/22/2023 52  % Final   Lymphs Abs 10/22/2023 1.8  0.7 - 4.0 K/uL Final   Monocytes Relative 10/22/2023 15  % Final   Monocytes Absolute 10/22/2023 0.5  0.1 - 1.0 K/uL Final   Eosinophils Relative 10/22/2023 5  % Final   Eosinophils Absolute  10/22/2023 0.2  0.0 - 0.5 K/uL Final   Basophils Relative 10/22/2023 1  % Final   Basophils Absolute 10/22/2023 0.0  0.0 - 0.1 K/uL Final   WBC Morphology 10/22/2023 MORPHOLOGY UNREMARKABLE   Final   RBC Morphology 10/22/2023 MORPHOLOGY UNREMARKABLE   Final   Smear Review 10/22/2023 See Note   Final   PLATELETS APPEAR DECREASED   Immature Granulocytes 10/22/2023 0  % Final   Abs Immature Granulocytes 10/22/2023 0.01  0.00 - 0.07 K/uL Final   Performed at [facility], [address] [place], [place] [address]   Salicylate Lvl 10/22/2023 <7.0 (L)  7.0 - 30.0 mg/dL Final   Performed at [facility], [address] [place], [place] [address]   Acetaminophen  (Tylenol ), Serum 10/22/2023 <10 (L)  10 - 30 ug/mL Final   Comment: (NOTE) Toxic concentrations can be more effectively related to post dose interval; > 200, > 100, and > 50 ug/mL serum concentrations correspond to toxic concentrations at 4, 8, and 12 hours post dose, respectively.  Performed at [facility], [address] [place], [place] [address]    Sodium 10/22/2023 136  135 - 145 mmol/L Final   Potassium 10/22/2023 4.9  3.5 - 5.1 mmol/L Final   Delta check noted    Chloride 10/22/2023 104  98 - 111 mmol/L Final   CO2 10/22/2023 19 (L)  22 - 32 mmol/L Final   Glucose, Bld 10/22/2023 88  70 - 99 mg/dL Final   Glucose reference range applies only to samples taken after fasting for at least 8 hours.   BUN 10/22/2023 5 (L)  6 - 20 mg/dL Final   Creatinine, Ser 10/22/2023 0.65  0.44 - 1.00 mg/dL Final   Calcium 89/79/7974 8.0 (L)  8.9 - 10.3 mg/dL Final   GFR, Estimated 10/22/2023 >60  >60 mL/min Final   Comment: (NOTE) Calculated using the CKD-EPI Creatinine Equation (2021)    Anion gap 10/22/2023 13  5 - 15 Final   Performed at [facility], [address] [place], [place] [address]   Acetaminophen  (Tylenol ), Serum 10/22/2023 <10 (L)  10 - 30 ug/mL  Final   Comment: (NOTE) Toxic concentrations can be more effectively related to post dose interval; > 200, > 100, and > 50 ug/mL serum concentrations correspond to toxic concentrations at 4, 8, and 12 hours post dose, respectively.  Performed at [facility], [address], [place], [place] [address]    Magnesium 10/22/2023 1.8  1.7 - 2.4 mg/dL Final   Performed at [facility], [address] [place], [place] [address]   Magnesium 10/23/2023 2.7 (H)  1.7 - 2.4 mg/dL Final   Performed at [facility], [address] [place], [place] [address]   Sodium 10/23/2023 138  135 - 145 mmol/L Final   Potassium 10/23/2023 3.7  3.5 - 5.1  mmol/L Final   Delta check noted    Chloride 10/23/2023 104  98 - 111 mmol/L Final   CO2 10/23/2023 20 (L)  22 - 32 mmol/L Final   Glucose, Bld 10/23/2023 85  70 - 99 mg/dL Final   Glucose reference range applies only to samples taken after fasting for at least 8 hours.   BUN 10/23/2023 6  6 - 20 mg/dL Final   Creatinine, Ser 10/23/2023 0.72  0.44 - 1.00 mg/dL Final   Calcium 89/78/7974 8.3 (L)  8.9 - 10.3 mg/dL Final   GFR, Estimated 10/23/2023 >60  >60 mL/min Final   Comment: (NOTE) Calculated using the CKD-EPI Creatinine Equation (2021)    Anion gap 10/23/2023 14  5 - 15 Final   Performed at [facility], [address] [place], [place] [address]    PSYCHIATRIC REVIEW OF SYSTEMS (ROS)  ROS: Notable for the following relevant positive findings: Review of Systems  Constitutional: Negative.   HENT: Negative.    Eyes: Negative.   Respiratory: Negative.    Cardiovascular: Negative.   Gastrointestinal: Negative.   Genitourinary: Negative.   Musculoskeletal: Negative.   Skin: Negative.   Neurological: Negative.   Endo/Heme/Allergies: Negative.   Psychiatric/Behavioral:  Positive for depression, substance abuse and suicidal ideas. The patient is nervous/anxious.      Additional findings:      Musculoskeletal: No abnormal movements observed      Gait & Station: Laying/Sitting      Pain Screening: Denies      Nutrition & Dental Concerns: Reviewed   RISK FORMULATION/ASSESSMENT  Is the patient experiencing any suicidal or homicidal ideations: Yes       Explain if yes:   Patient minimizing suicidal intent, but she admits to intentional overdose, and has longstanding depression, recently untreated, with a sister who committed suicide  Protective factors considered for safety management:   Patient's family has been unable to provide the needed safety to keep patient from harming herself.  Risk factors/concerns considered for safety management:  Family history of suicide Depression Substance abuse/dependence Recent loss Access to lethal means Hopelessness Impulsivity Isolation Barriers to accessing treatment Unmarried  Is there a safety management plan with the patient and treatment team to minimize risk factors and promote protective factors: No           Explain: As above, family have not been able to provide reliably a safe environment.  Is crisis care placement or psychiatric hospitalization recommended: Yes     Based on my current evaluation and risk assessment, patient is determined at this time to be at:  High risk  *RISK ASSESSMENT Risk assessment is a dynamic process; it is possible that this patient's condition, and risk level, may change. This should be re-evaluated and managed over time as appropriate. Please re-consult psychiatric consult services if additional assistance is needed in terms of risk assessment and management. If your team decides to discharge this patient, please advise the patient how to best access emergency psychiatric services, or to call 911, if their condition worsens or they feel unsafe in any way.   Adriana JINNY Pontes, MD Telepsychiatry Consult Services

## 2023-10-23 NOTE — ED Notes (Signed)
 Pt ambulated to the bathroom with no problem and provided oral care by self.

## 2023-10-23 NOTE — Assessment & Plan Note (Addendum)
 Intentional overdose Noted prior suicide attempt with ingestion of roughly 40 to 50 pills of hydroxyzine  at home in the setting of baseline depression no longer on treatment Patient IVC by psychiatry Will monitor electrolytes per poison control protocol Follow up [facility] recommendations  Monitor

## 2023-10-23 NOTE — Progress Notes (Signed)
  Carryover admission to the Day Admitter.  I discussed this case with the EDP, Dr. Carita.  Per these discussions:   This is a 53 year old female who is being admitted with intentional hydroxyzine  overdose associated with QTc prolongation after presenting to the ED following intentional ingestion of 60 tabs of 25 mg hydroxyzine  just before 1600 on 10/22/2023.  Poison control recommends maintaining potassium level greater than 4.0 and magnesium level greater than 2.0, along with a goal QTc of less than 500 ms.   Most recent magnesium level is 2.7.  Initial potassium level 2.7, prompting 20 mill equivalents of IV potassium chloride as well as 40 mill equivalents of oral potassium chloride, with ensuing increase in potassium level to 4.9, with repeat potassium level trending down to 3.7.  She is currently receiving an additional 40 mill equivalents of oral potassium chloride.  Additionally, patient's QTc remains prolonged at 580 ms.   pt is IVC'ed. suicide precautions in place.  I have placed an order for  obs to med-tele for further evaluation management of the above.  I have placed some additional preliminary admit orders via the adult multi-morbid admission order set. I have also ordered morning labs that include CMP, CBC, and magnesium level.  Additionally, I ordered a repeat EKG for 8 AM this morning.    Eva Pore, DO Hospitalist

## 2023-10-23 NOTE — ED Notes (Signed)
 Pt assisted to restroom.

## 2023-10-23 NOTE — ED Notes (Signed)
 Tele psych consult in 4am

## 2023-10-24 ENCOUNTER — Inpatient Hospital Stay
Admission: AD | Admit: 2023-10-24 | Discharge: 2023-10-31 | DRG: 885 | Disposition: A | Discharge status: Home / Self Care | Payer: 59 | Source: Intra-hospital | Attending: Student in an Organized Health Care Education/Training Program | Admitting: Student in an Organized Health Care Education/Training Program

## 2023-10-24 ENCOUNTER — Encounter: Payer: Self-pay | Admitting: Family

## 2023-10-24 DIAGNOSIS — R45851 Suicidal ideations: Secondary | ICD-10-CM | POA: Diagnosis present

## 2023-10-24 DIAGNOSIS — Z91013 Allergy to seafood: Secondary | ICD-10-CM

## 2023-10-24 DIAGNOSIS — I10 Essential (primary) hypertension: Secondary | ICD-10-CM

## 2023-10-24 DIAGNOSIS — F419 Anxiety disorder, unspecified: Secondary | ICD-10-CM | POA: Diagnosis present

## 2023-10-24 DIAGNOSIS — E876 Hypokalemia: Secondary | ICD-10-CM

## 2023-10-24 DIAGNOSIS — E039 Hypothyroidism, unspecified: Secondary | ICD-10-CM

## 2023-10-24 DIAGNOSIS — Z9884 Bariatric surgery status: Secondary | ICD-10-CM | POA: Diagnosis not present

## 2023-10-24 DIAGNOSIS — Z23 Encounter for immunization: Secondary | ICD-10-CM | POA: Diagnosis not present

## 2023-10-24 DIAGNOSIS — F332 Major depressive disorder, recurrent severe without psychotic features: Principal | ICD-10-CM | POA: Diagnosis present

## 2023-10-24 DIAGNOSIS — F1010 Alcohol abuse, uncomplicated: Secondary | ICD-10-CM | POA: Diagnosis present

## 2023-10-24 DIAGNOSIS — Z8249 Family history of ischemic heart disease and other diseases of the circulatory system: Secondary | ICD-10-CM

## 2023-10-24 DIAGNOSIS — Z818 Family history of other mental and behavioral disorders: Secondary | ICD-10-CM

## 2023-10-24 DIAGNOSIS — D61818 Other pancytopenia: Secondary | ICD-10-CM

## 2023-10-24 DIAGNOSIS — R21 Rash and other nonspecific skin eruption: Secondary | ICD-10-CM | POA: Diagnosis present

## 2023-10-24 DIAGNOSIS — Z880 Allergy status to penicillin: Secondary | ICD-10-CM

## 2023-10-24 HISTORY — DX: Anemia, unspecified: D64.9

## 2023-10-24 LAB — BASIC METABOLIC PANEL WITH GFR
Anion gap: 13 (ref 5–15)
Anion gap: 11 (ref 5–15)
BUN: 9 mg/dL (ref 6–20)
BUN: 9 mg/dL (ref 6–20)
CO2: 21 mmol/L — ABNORMAL LOW (ref 22–32)
CO2: 21 mmol/L — ABNORMAL LOW (ref 22–32)
Calcium: 8.9 mg/dL (ref 8.9–10.3)
Calcium: 9.1 mg/dL (ref 8.9–10.3)
Chloride: 104 mmol/L (ref 98–111)
Chloride: 103 mmol/L (ref 98–111)
Creatinine, Ser: 0.77 mg/dL (ref 0.44–1.00)
Creatinine, Ser: 0.73 mg/dL (ref 0.44–1.00)
GFR, Estimated: >60 mL/min (ref >60)
GFR, Estimated: >60 mL/min (ref >60)
Glucose, Bld: 94 mg/dL (ref 70–99)
Glucose, Bld: 96 mg/dL (ref 70–99)
Potassium: 3.6 mmol/L (ref 3.5–5.1)
Potassium: 4.4 mmol/L (ref 3.5–5.1)
Sodium: 137 mmol/L (ref 135–145)
Sodium: 136 mmol/L (ref 135–145)

## 2023-10-24 LAB — PHOSPHORUS: Phosphorus: 2.6 mg/dL (ref 2.5–4.6)

## 2023-10-24 LAB — MAGNESIUM: Magnesium: 2.1 mg/dL (ref 1.7–2.4)

## 2023-10-24 MED ORDER — LEVOTHYROXINE SODIUM 50 MCG PO TABS
75.0000 ug | ORAL_TABLET | Freq: Every day | ORAL | Status: DC
  Administered 2023-10-26 – 2023-10-31 (×6): 75 ug via ORAL
  Filled 2023-10-24 (×7): qty 2

## 2023-10-24 MED ORDER — DIPHENHYDRAMINE HCL 25 MG PO CAPS
50.0000 mg | ORAL_CAPSULE | Freq: Three times a day (TID) | ORAL | Status: DC | PRN
Start: 2023-10-24 — End: 2023-10-31
  Administered 2023-10-26 – 2023-10-30 (×5): 50 mg via ORAL
  Filled 2023-10-24 (×5): qty 2

## 2023-10-24 MED ORDER — LORAZEPAM 2 MG PO TABS: 0.0000 mg | ORAL_TABLET | Freq: Four times a day (QID) | ORAL | Status: AC

## 2023-10-24 MED ORDER — MAGNESIUM HYDROXIDE 400 MG/5ML PO SUSP
30.0000 mL | Freq: Every day | ORAL | Status: DC | PRN
Start: 2023-10-24 — End: 2023-10-31

## 2023-10-24 MED ORDER — DIPHENHYDRAMINE HCL 50 MG/ML IJ SOLN: 50.0000 mg | Freq: Three times a day (TID) | INTRAMUSCULAR | Status: DC | PRN

## 2023-10-24 MED ORDER — BUPROPION HCL ER (XL) 150 MG PO TB24
150.0000 mg | ORAL_TABLET | Freq: Every day | ORAL | Status: DC
  Administered 2023-10-25 – 2023-10-31 (×7): 150 mg via ORAL
  Filled 2023-10-24 (×8): qty 1

## 2023-10-24 MED ORDER — POTASSIUM CHLORIDE CRYS ER 10 MEQ PO TBCR
40.0000 meq | EXTENDED_RELEASE_TABLET | Freq: Once | ORAL | Status: AC
  Administered 2023-10-24: 40 meq via ORAL
  Filled 2023-10-24: qty 4

## 2023-10-24 MED ORDER — ADULT MULTIVITAMIN W/MINERALS CH: 1.0000 | ORAL_TABLET | Freq: Every day | ORAL | Status: DC

## 2023-10-24 MED ORDER — AMLODIPINE BESYLATE 5 MG PO TABS
5.0000 mg | ORAL_TABLET | Freq: Every day | ORAL | Status: DC
  Administered 2023-10-24 – 2023-10-31 (×8): 5 mg via ORAL
  Filled 2023-10-24 (×8): qty 1

## 2023-10-24 MED ORDER — ALUM & MAG HYDROXIDE-SIMETH 200-200-20 MG/5ML PO SUSP: 30.0000 mL | ORAL | Status: DC | PRN

## 2023-10-24 MED ORDER — VITAMIN D 25 MCG (1000 UNIT) PO TABS
1000.0000 [IU] | ORAL_TABLET | Freq: Every day | ORAL | Status: DC
  Administered 2023-10-25 – 2023-10-31 (×7): 1000 [IU] via ORAL
  Filled 2023-10-24 (×7): qty 1

## 2023-10-24 MED ORDER — HALOPERIDOL 5 MG PO TABS
5.0000 mg | ORAL_TABLET | Freq: Three times a day (TID) | ORAL | Status: DC | PRN
Start: 2023-10-24 — End: 2023-10-31
  Administered 2023-10-26: 5 mg via ORAL
  Filled 2023-10-24: qty 1

## 2023-10-24 MED ORDER — THIAMINE MONONITRATE 100 MG PO TABS
100.0000 mg | ORAL_TABLET | Freq: Every day | ORAL | Status: DC
  Administered 2023-10-26 – 2023-10-31 (×6): 100 mg via ORAL
  Filled 2023-10-24 (×6): qty 1

## 2023-10-24 MED ORDER — LORAZEPAM 2 MG/ML IJ SOLN: 2.0000 mg | Freq: Three times a day (TID) | INTRAMUSCULAR | Status: DC | PRN

## 2023-10-24 MED ORDER — FOLIC ACID 1 MG PO TABS: 1.0000 mg | ORAL_TABLET | Freq: Every day | ORAL | Status: DC

## 2023-10-24 MED ORDER — HALOPERIDOL LACTATE 5 MG/ML IJ SOLN: 5.0000 mg | Freq: Three times a day (TID) | INTRAMUSCULAR | Status: DC | PRN

## 2023-10-24 MED ORDER — HALOPERIDOL LACTATE 5 MG/ML IJ SOLN: 10.0000 mg | Freq: Three times a day (TID) | INTRAMUSCULAR | Status: DC | PRN

## 2023-10-24 MED ORDER — POTASSIUM 99 MG PO TABS: 99.0000 mg | ORAL_TABLET | ORAL | Status: DC

## 2023-10-24 MED ORDER — VITAMIN B-1 100 MG PO TABS: 100.0000 mg | ORAL_TABLET | Freq: Every day | ORAL | Status: DC

## 2023-10-24 MED ORDER — LORAZEPAM 2 MG/ML IJ SOLN: 0.0000 mg | Freq: Four times a day (QID) | INTRAMUSCULAR | Status: AC

## 2023-10-24 MED ORDER — LORAZEPAM 2 MG PO TABS
0.0000 mg | ORAL_TABLET | Freq: Two times a day (BID) | ORAL | Status: AC
  Filled 2023-10-24: qty 1

## 2023-10-24 MED ORDER — LORAZEPAM 2 MG/ML IJ SOLN: 0.0000 mg | Freq: Two times a day (BID) | INTRAMUSCULAR | Status: AC

## 2023-10-24 MED ORDER — VITAMIN D 25 MCG (1000 UNIT) PO TABS
1000.0000 [IU] | ORAL_TABLET | Freq: Every day | ORAL | Status: DC
  Administered 2023-10-24: 1000 [IU] via ORAL
  Filled 2023-10-24: qty 1

## 2023-10-24 MED ORDER — ADULT MULTIVITAMIN W/MINERALS CH
1.0000 | ORAL_TABLET | Freq: Every day | ORAL | Status: DC
  Administered 2023-10-26 – 2023-10-31 (×6): 1 via ORAL
  Filled 2023-10-24 (×6): qty 1

## 2023-10-24 MED ORDER — LEVOTHYROXINE SODIUM 75 MCG PO TABS: 75.0000 ug | ORAL_TABLET | Freq: Every day | ORAL | Status: DC

## 2023-10-24 MED ORDER — ACETAMINOPHEN 325 MG PO TABS
650.0000 mg | ORAL_TABLET | Freq: Four times a day (QID) | ORAL | Status: DC | PRN
  Administered 2023-10-27 – 2023-10-28 (×2): 650 mg via ORAL
  Filled 2023-10-24 (×2): qty 2

## 2023-10-24 MED ORDER — THIAMINE HCL 100 MG/ML IJ SOLN: 100.0000 mg | Freq: Every day | INTRAMUSCULAR | Status: DC

## 2023-10-24 NOTE — TOC Transition Note (Addendum)
 Transition of Care [facility]) - Discharge Note   Patient Details  Name: Charlene [name] MRN: [id number] Date of Birth: [birth year]  Transition of [facility]) CM/SW Contact:  Bascom Service, RN Phone Number: 10/24/2023, 1:57 PM   Clinical Narrative: Accepted to [facility] [address] [place] [phone], BMU rm#[id number],report#[phone] [id number]. Sheriff for transport-tel#[phone]. Will inform nurse to call report once all info confirmed.Then I will call Sheriff for transport.  -3:40p I called the sheriff @ [phone]-he will contact nurse Jodi @ [phone] to give  an ETA, also to complete the emtala form he says its time sensitve. No further CM needs.    Final next level of care: Psychiatric Hospital Barriers to Discharge: No Barriers Identified   Patient Goals and CMS Choice Patient states their goals for this hospitalization and ongoing recovery are:: IVC-Inpt Psych          Discharge Placement                       Discharge Plan and Services Additional resources added to the After Visit Summary for     Discharge Planning Services: CM Consult                                 Social Drivers of Health (SDOH) Interventions SDOH Screenings   Food Insecurity: No Food Insecurity (10/23/2023)  Housing: Low Risk  (10/23/2023)  Transportation Needs: No Transportation Needs (10/23/2023)  Utilities: Not At Risk (10/23/2023)  Social Connections: Unknown (05/13/2021)   Received from [facility]  Tobacco Use: Low Risk  (10/23/2023)     Readmission Risk Interventions     No data to display

## 2023-10-24 NOTE — Progress Notes (Signed)
 Gina from [employer] called about this patient asking for the QTC and QRS duration. She said that since the QTC is down trending they are going to close the case of this patient. Should there be any question and concerns, call [phone].

## 2023-10-24 NOTE — Plan of Care (Signed)

## 2023-10-24 NOTE — Progress Notes (Signed)
 Patient arrived ambulatory on the unit from [facility]. Patient is alert, oriented, pleasant and cooperative. She reports taking around 40 Hydroxyzine  tablets in an attempt to sleep or escape from her recent stressors versus a suicide attempt. Patient denies current SI/HI/AVH.   Patient reports she is feeling nervous but hopeful. States she has a goal of going to IOP at the [facility]. Reports she had been in that program several years ago and was successful. Her goals include:   Stop drinking Work out more Weight loss Being more present with her children  Patient searched and no contraband found. All forms signed. Patient oriented to unit and all questions answered.    10/24/23 1800  Psych Admission Type (Psych Patients Only)  Admission Status Involuntary  Psychosocial Assessment  Patient Complaints Nervousness;Substance abuse  Eye Contact Fair  Facial Expression Animated  Affect Appropriate to circumstance  Speech Logical/coherent  Interaction Assertive  Motor Activity Fidgety  Appearance/Hygiene Unremarkable;In scrubs  Behavior Characteristics Cooperative;Appropriate to situation  Mood Pleasant;Euthymic  Thought Process  Coherency WDL  Content WDL  Delusions None reported or observed  Perception WDL  Hallucination None reported or observed  Judgment WDL  Confusion None  Danger to Self  Current suicidal ideation? Denies  Danger to Others  Danger to Others None reported or observed

## 2023-10-24 NOTE — Consult Note (Incomplete)
 [facility] {CHL [facility] INITIAL OR FOLLOW LE:68184}  Patient Name: .Charlene [name]  MRN: [id number]  DOB: 27-Oct-1970  Consult Order details:  Orders (From admission, onward)     Start     Ordered   10/24/23 0759  IP CONSULT TO PSYCHIATRY       Ordering Provider: Sebastian Toribio GAILS, MD  Provider:  (Not yet assigned)  Question Answer Comment  Location [facility]   Reason for Consult? Suicidal ideation/untreated depression      10/24/23 0758   10/22/23 2213  CONSULT TO CALL ACT TEAM       Ordering Provider: Ruthe Cornet, DO  Provider:  (Not yet assigned)  Question:  Reason for Consult?  Answer:  Psych consult   10/22/23 2212             Mode of Visit: {Type of visit:31911}    Psychiatry Consult Evaluation  Service Date: October 24, 2023 LOS:  LOS: 0 days  Chief Complaint ***  Primary Psychiatric Diagnoses  *** 2.  *** 3.  ***  Assessment  Mele Aydelott is a 53 y.o. female admitted: {CHL BH Medical or Presented to ZI:68182}qnm 10/22/2023  4:01 PM for ***. She carries the psychiatric diagnoses of *** and has a past medical history of  ***.   Her current presentation of *** is most consistent with ***. She meets criteria for *** based on ***.  Current outpatient psychotropic medications include *** and historically she has had a *** response to these medications. She was *** compliant with medications prior to admission as evidenced by ***. On initial examination, patient ***. Please see plan below for detailed recommendations.   Diagnoses:  Active Hospital problems: Principal Problem:   QT prolongation Active Problems:   Suicide attempt (HCC)   Alcohol abuse    Plan   ## Psychiatric Medication Recommendations:  ***  ## Medical Decision Making Capacity: {CHL BH MEDICAL DECISION MAKING CAPACITY:31818}  ## Further Work-up:  -- *** {CHLmacgeneralandspecificworkuprecs:31821} -- most recent EKG on *** had QtC of  *** -- Pertinent labwork reviewed earlier this admission includes: ***   ## Disposition:-- {CHLmaccldispo:31820}  ## Behavioral / Environmental: -{CHLmacbehavioralenvironmental2:31847}    ## Safety and Observation Level:  - Based on my clinical evaluation, I estimate the patient to be at *** risk of self harm in the current setting. - At this time, we recommend  {CHL BH SUICIDE OBSERVATION LEVEL:31850}. This decision is based on my review of the chart including patient's history and current presentation, interview of the patient, mental status examination, and consideration of suicide risk including evaluating suicidal ideation, plan, intent, suicidal or self-harm behaviors, risk factors, and protective factors. This judgment is based on our ability to directly address suicide risk, implement suicide prevention strategies, and develop a safety plan while the patient is in the clinical setting. Please contact our team if there is a concern that risk level has changed.  CSSR Risk Category:C-SSRS RISK CATEGORY: High Risk  Suicide Risk Assessment: Patient has following modifiable risk factors for suicide: {CHLmacmodifiablesuicideriskfactors:31822}, which we are addressing by ***. Patient has following non-modifiable or demographic risk factors for suicide: {CHLmacnonmodifiablesuicideriskfactors:31823} Patient has the following protective factors against suicide: {CHLmacprotectivefactors:31824}  Thank you for this consult request. Recommendations have been communicated to the primary team.  We will *** at this time.   Majel GORMAN Ramp, FNP       History of Present Illness  Relevant Aspects of [facility] or ED  course:31819} Course:  Admitted on 10/22/2023 for ***. They ***.   Patient Report:  ***  Psych ROS:  Depression: *** Anxiety:  *** Mania (lifetime and current): *** Psychosis: (lifetime and current): ***  Collateral information:  Contacted *** at *** on  ***  ROS   Psychiatric and Social History  Psychiatric History:  Information collected from ***  Prev Dx/Sx: *** Current Psych Provider: *** Home Meds (current): *** Previous Med Trials: *** Therapy: ***  Prior Psych Hospitalization: ***  Prior Self Harm: *** Prior Violence: ***  Family Psych History: *** Family Hx suicide: ***  Social History:  Developmental Hx: *** Educational Hx: *** Occupational Hx: *** Legal Hx: *** Living Situation: *** Spiritual Hx: *** Access to weapons/lethal means: ***   Substance History Alcohol: ***  Type of alcohol *** Last Drink *** Number of drinks per day *** History of alcohol withdrawal seizures *** History of DT's *** Tobacco: *** Illicit drugs: *** Prescription drug abuse: *** Rehab hx: ***  Exam Findings  Physical Exam: *** Vital Signs:  Temp:  [98 F (36.7 C)-98.8 F (37.1 C)] 98 F (36.7 C) (10/22 0730) Pulse Rate:  [79-104] 88 (10/22 0730) Resp:  [14-20] 14 (10/22 0730) BP: (130-155)/(71-88) 131/88 (10/22 0730) SpO2:  [95 %-100 %] 99 % (10/22 0730) Weight:  [126.4 kg-126.5 kg] 126.4 kg (10/22 0500) Blood pressure 131/88, pulse 88, temperature 98 F (36.7 C), temperature source Oral, resp. rate 14, height 5' 10 (1.778 m), weight 126.4 kg, last menstrual period 09/17/2019, SpO2 99%. Body mass index is 39.98 kg/m.  Physical Exam  Mental Status Exam: General Appearance: {Appearance:22683}  Orientation:  {BHH ORIENTATION (PAA):22689}  Memory:  {BHH MEMORY:22881}  Concentration:  {Concentration:21399}  Recall:  {BHH GOOD/FAIR/POOR:22877}  Attention  {BH Attention Span:31825}  Eye Contact:  {BHH EYE CONTACT:22684}  Speech:  {Speech:22685}  Language:  {BHH GOOD/FAIR/POOR:22877}  Volume:  {Volume (PAA):22686}  Mood: ***  Affect:  {Affect (PAA):22687}  Thought Process:  {Thought Process (PAA):22688}  Thought Content:  {Thought Content:22690}  Suicidal Thoughts:  {ST/HT (PAA):22692}  Homicidal Thoughts:  {ST/HT  (PAA):22692}  Judgement:  {Judgement (PAA):22694}  Insight:  {Insight (PAA):22695}  Psychomotor Activity:  {Psychomotor (PAA):22696}  Akathisia:  {BHH YES OR NO:22294}  Fund of Knowledge:  {BHH GOOD/FAIR/POOR:22877}      Assets:  {Assets (PAA):22698}  Cognition:  {chl bhh cognition:304700322}  ADL's:  {BHH JIO'D:77709}  AIMS (if indicated):        Other History   These have been pulled in through the EMR, reviewed, and updated if appropriate.  Family History:  The patient's family history includes Heart disease in her father; Hypertension in her father and mother.  Medical History: Past Medical History:  Diagnosis Date  . Hypertension   . Lactating mother 07/13/2010  . No pertinent past medical history     Surgical History: Past Surgical History:  Procedure Laterality Date  . GASTRIC BYPASS  2000  . HERNIA REPAIR  2004  . ROBOTIC ASSISTED LAPAROSCOPIC HYSTERECTOMY AND SALPINGECTOMY Bilateral 11/17/2019   Procedure: XI ROBOTIC ASSISTED LAPAROSCOPIC HYSTERECTOMY AND BILATERAL SALPINGECTOMY;  Surgeon: Kandyce Sor, MD;  Location: [facility];  Service: Gynecology;  Laterality: Bilateral;  . VENTRAL HERNIA REPAIR  2004     Medications:   Current Facility-Administered Medications:  .  acetaminophen  (TYLENOL ) tablet 650 mg, 650 mg, Oral, Q6H PRN **OR** acetaminophen  (TYLENOL ) suppository 650 mg, 650 mg, Rectal, Q6H PRN, Howerter, Justin B, DO .  cholecalciferol  (VITAMIN D3) 25 MCG (1000 UNIT) tablet  1,000 Units, 1,000 Units, Oral, Daily, Sebastian Toribio GAILS, MD .  NOREEN ON 10/25/2023] levothyroxine  (SYNTHROID ) tablet 75 mcg, 75 mcg, Oral, Q0600, Sebastian Toribio GAILS, MD .  LORazepam  (ATIVAN ) injection 0-4 mg, 0-4 mg, Intravenous, Q6H **OR** LORazepam  (ATIVAN ) tablet 0-4 mg, 0-4 mg, Oral, Q6H, Curatolo, Adam, DO, 2 mg at 10/23/23 1536 .  [START ON 10/25/2023] LORazepam  (ATIVAN ) injection 0-4 mg, 0-4 mg, Intravenous, Q12H **OR** [START ON 10/25/2023] LORazepam   (ATIVAN ) tablet 0-4 mg, 0-4 mg, Oral, Q12H, Curatolo, Adam, DO .  multivitamin with minerals tablet 1 tablet, 1 tablet, Oral, Daily, Eldonna Elspeth PARAS, MD, 1 tablet at 10/23/23 2220 .  potassium chloride (KLOR-CON M) CR tablet 40 mEq, 40 mEq, Oral, Once, Sebastian Toribio GAILS, MD .  thiamine (VITAMIN B1) tablet 100 mg, 100 mg, Oral, Daily, 100 mg at 10/23/23 0941 **OR** thiamine (VITAMIN B1) injection 100 mg, 100 mg, Intravenous, Daily, Curatolo, Adam, DO  Allergies: Allergies  Allergen Reactions  . Penicillins Hives and Shortness Of Breath    Heart rate increases  . Shellfish Allergy Anaphylaxis    Swells up face Swells up face    Majel GORMAN Ramp, FNP

## 2023-10-24 NOTE — TOC Initial Note (Addendum)
 Transition of Care [facility]) - Initial/Assessment Note    Patient Details  Name: Charlene [name] MRN: [id number] Date of Birth: [birth year]  Transition of Care [facility] CM/SW Contact:    Bascom Service, RN Phone Number: 10/24/2023, 9:43 AM  Clinical Narrative: IVC 10/20-10/26 @ 5:56p. Will check with [facility] for inpt acceptance.Sheriff for transport @ d/c while IVC.                  Expected Discharge Plan: Psychiatric Hospital Barriers to Discharge: Continued Medical Work up   Patient Goals and CMS Choice Patient states their goals for this hospitalization and ongoing recovery are:: IVC-Inpt Psych          Expected Discharge Plan and Services   Discharge Planning Services: CM Consult   Living arrangements for the past 2 months: Single Family Home                                      Prior Living Arrangements/Services Living arrangements for the past 2 months: Single Family Home Lives with:: Parents                   Activities of Daily Living   ADL Screening (condition at time of admission) Independently performs ADLs?: Yes (appropriate for developmental age) Is the patient deaf or have difficulty hearing?: No Does the patient have difficulty seeing, even when wearing glasses/contacts?: No Does the patient have difficulty concentrating, remembering, or making decisions?: No  Permission Sought/Granted                  Emotional Assessment              Admission diagnosis:  Suicide attempt (HCC) [T14.91XA] Prolonged Q-T interval on ECG [R94.31] QT prolongation [R94.31] Patient Active Problem List   Diagnosis Date Noted   QT prolongation 10/23/2023   Suicide attempt (HCC) 10/23/2023   Alcohol abuse 10/23/2023   Menorrhagia 11/17/2019   S/P hysterectomy 11/17/2019   Iron deficiency anemia 10/08/2017   Gastric bypass status for obesity 11/14/2016   Anemia 08/01/2011   PCP:  Verena Mems, MD Pharmacy:   [id number] -  [place], [place] - [address] [place] [address] Phone: [phone] Fax: [phone]     Social Drivers of Health (SDOH) Social History: SDOH Screenings   Food Insecurity: No Food Insecurity (10/23/2023)  Housing: Low Risk  (10/23/2023)  Transportation Needs: No Transportation Needs (10/23/2023)  Utilities: Not At Risk (10/23/2023)  Social Connections: Unknown (05/13/2021)   Received from [facility]  Tobacco Use: Low Risk  (10/23/2023)   SDOH Interventions:     Readmission Risk Interventions     No data to display

## 2023-10-24 NOTE — Discharge Summary (Signed)
 Physician Discharge Summary  Charlene [name] FMW:[id number] DOB: [birth year] DOA: 10/22/2023  PCP: Verena Mems, MD  Admit date: 10/22/2023 Discharge date: 10/24/2023  Time spent: 60 minutes  Recommendations for Outpatient Follow-up:  Discharge to inpatient psychiatry at [facility].  Will likely need basic metabolic profile done in the next 1 to 2 days to follow-up on electrolytes and renal function. Follow-up with Verena Mems, MD 2 weeks postdischarge from inpatient psychiatry.  On follow-up patient will need a comprehensive metabolic profile done to follow-up on electrolytes and renal function and LFTs, CBC done to follow-up on counts, TSH done to follow-up on hypothyroidism.  Patient's blood pressure will need to be reassessed on follow-up as well as chronic medical issues..   Discharge Diagnoses:  Principal Problem:   QT prolongation Active Problems:   Suicide attempt (HCC)   Iron deficiency anemia   Alcohol abuse   Hypothyroidism   Hypokalemia   Hypertension   Pancytopenia (HCC)   Discharge Condition: Stable and improved.  Diet recommendation: Regular  Filed Weights   10/22/23 1623 10/23/23 1940 10/24/23 0500  Weight: 127.9 kg 126.5 kg 126.4 kg    History of present illness:  HPI per Dr. Eldonna Calton Essex is a 53 y.o. female with medical history significant of obesity, hypertension, alcohol abuse, depression presenting with intentional overdose, QT prolongation.  Patient reports ingesting roughly 40 to 50 pills of hydroxyzine  yesterday.  No regular alcohol intake.  Drinking roughly 3+ beers daily as well as intermittent hard liquor intake.  Patient states she has a baseline history of chronic depression.  Has been out of medication secondary to job loss for multiple months.  Denies any known history of HI/SI in the past.  No chest pain or shortness of breath.  Chills.  No abdominal pain.  Positive mild nausea.  Patient noted to have been found by her mother after  the event at home.  Presented to the ER afebrile, hemodynamically stable.  Satting well on room air.  White count 3.6, hemoglobin 10.8, platelets 123.  Creatinine 0.62.  Salicylate, Tylenol  level as well as drug screen all within normal limits.  Alcohol level 332.  Initial EKG with QTc in the 570s.  Case discussed with poison control with recommendation of keeping potassium greater than 4 and magnesium greater than 2 in the setting of prolonged QT.    Hospital Course:  #1 QT prolongation -Patient noted to have QT prolongation with a QTc of 570s in the setting of intentional overdose with hydroxyzine  and also noted to have electrolyte abnormality of hypokalemia. - Case discussed with poison control with recommendation of maintaining potassium greater than 4 magnesium greater than 2 and goal QTc of less than 500 ms. - Patient placed on telemetry and serial EKGs obtained with repeat EKG done on the day of discharge with resolution of QT prolongation. - Electrolytes repleted. - Outpatient follow-up with PCP.  2.  Hypokalemia -Repleted.  3.  Suicide attempt/intentional overdose -Patient did endorse suicidal ideation with drug overdose of hydroxyzine  stating that she wanted to just sleep for a few days. - Patient with noted prior suicide attempt with ingestion of roughly 40 to 50 pills of hydroxyzine  on presentation at home in the setting of baseline depression and no longer on treatment due to financial issues and loss of job. - Patient stated had not taken any of her antidepressant medications in over a year. - Patient seen by telepsychiatry on admission patient IVC and recommended inpatient admission when medically stable. - Inpatient psychiatric  consultation also placed and inpatient psychiatry recommended. - Patient medically stable and will be discharged to inpatient psychiatry at [facility].  4.  Alcohol abuse -Patient noted to have reported regular alcohol use including beer and liquor. - Patient  was placed on the Ativan  withdrawal protocol, thiamine, folic acid, multivitamin. - Patient did not have any significant withdrawal signs or symptoms during the hospitalization will be discharged in stable condition. - Alcohol cessation stressed to patient.  5.  Hypertension -Patient noted to have been supposedly on Norvasc and HCTZ prior to admission which patient was not taking due to financial issues. - Patient's antihypertensive medications will be resumed on discharge.  6.  Hypokalemia -Repleted during the hospitalization. - Outpatient follow-up with PCP.  7.  Hypothyroidism -Patient's home regimen Synthroid  resumed during the hospitalization. - Outpatient follow-up with PCP.  8.  Pancytopenia -Likely secondary to chronic alcohol use. - Patient with no overt bleeding, no signs or symptoms of infection. - Outpatient follow-up with PCP.  Procedures: None  Consultations: Psychiatry  Discharge Exam: Vitals:   10/24/23 0730 10/24/23 1306  BP: 131/88 130/80  Pulse: 88 97  Resp: 14 16  Temp: 98 F (36.7 C) 97.6 F (36.4 C)  SpO2: 99% 99%    General: NAD Cardiovascular: RRR no murmurs rubs gallops with no JVD.  No lower extremity edema. Respiratory: Clear to auscultation bilaterally.  No wheezes, no crackles, no rhonchi.  Fair air movement.  Speaking in full sentences.  Discharge Instructions   Discharge Instructions     Diet general   Complete by: As directed    Increase activity slowly   Complete by: As directed       Allergies as of 10/24/2023       Reactions   Penicillins Hives, Shortness Of Breath   Heart rate increases   Shellfish Allergy Anaphylaxis   Swells up face Swells up face        Medication List     PAUSE taking these medications    buPROPion  150 MG 24 hr tablet Wait to take this until your doctor or other care provider tells you to start again. Commonly known as: WELLBUTRIN  XL TAKE 1 TABLET BY MOUTH EVERY DAY   hydrOXYzine  25  MG tablet Wait to take this until your doctor or other care provider tells you to start again. Commonly known as: ATARAX  TAKE 1 TO 2 TABLETS BY MOUTH AT BEDTIME   lamoTRIgine  200 MG tablet Wait to take this until your doctor or other care provider tells you to start again. Commonly known as: LaMICtal  Take one tablet at bedtime.   Potassium 99 MG Tabs Wait to take this until your doctor or other care provider tells you to start again. Take 1 tablet (99 mg total) by mouth every other day.   Vitamin D  (Ergocalciferol ) 1.25 MG (50000 UNIT) Caps capsule Wait to take this until your doctor or other care provider tells you to start again. Commonly known as: DRISDOL Take 1 capsule once a week by mouth.       STOP taking these medications    ibuprofen  800 MG tablet Commonly known as: ADVIL        TAKE these medications    acetaminophen  500 MG tablet Commonly known as: TYLENOL  Take 2 tablets (1,000 mg total) by mouth every 6 (six) hours as needed.   amLODipine 5 MG tablet Commonly known as: NORVASC Take 5 mg by mouth daily.   cholecalciferol  25 MCG (1000 UNIT) tablet Commonly known as: VITAMIN  D3 Take 1,000 Units by mouth daily.   cyanocobalamin 1000 MCG/ML injection Commonly known as: VITAMIN B12 Inject 1,000 mcg into the muscle every 30 (thirty) days.   folic acid 1 MG tablet Commonly known as: FOLVITE Take 1 tablet (1 mg total) by mouth daily.   hydrochlorothiazide 12.5 MG capsule Commonly known as: MICROZIDE Take 12.5 mg by mouth daily.   levothyroxine  75 MCG tablet Commonly known as: SYNTHROID  Take 75 mcg by mouth daily before breakfast.   multivitamin with minerals Tabs tablet Take 1 tablet by mouth daily. Start taking on: October 25, 2023   thiamine 100 MG tablet Commonly known as: Vitamin B-1 Take 1 tablet (100 mg total) by mouth daily. Start taking on: October 25, 2023       Allergies  Allergen Reactions   Penicillins Hives and Shortness Of  Breath    Heart rate increases   Shellfish Allergy Anaphylaxis    Swells up face Swells up face    Follow-up Information     Verena Mems, MD. Schedule an appointment as soon as possible for a visit in 2 week(s).   Specialty: Family Medicine Contact information: [address] [place] [address] [phone]                  The results of significant diagnostics from this hospitalization (including imaging, microbiology, ancillary and laboratory) are listed below for reference.    Significant Diagnostic Studies: No results found.  Microbiology: No results found for this or any previous visit (from the past 240 hours).   Labs: Basic Metabolic Panel: Recent Labs  Lab 10/22/23 2015 10/23/23 0054 10/23/23 0200 10/23/23 0630 10/23/23 1401 10/23/23 2047 10/24/23 0057 10/24/23 1214  NA 136 138  --  139 138  --  137 136  K 4.9 3.7  --  4.2 4.3  --  3.6 4.4  CL 104 104  --  107 105  --  104 103  CO2 19* 20*  --  17* 18*  --  21* 21*  GLUCOSE 88 85  --  70 164*  --  94 96  BUN 5* 6  --  5* 8  --  9 9  CREATININE 0.65 0.72  --  0.62 0.95  --  0.77 0.73  CALCIUM 8.0* 8.3*  --  8.3* 8.7*  --  8.9 9.1  MG 1.8  --  2.7* 2.2  --  2.1 2.1  --   PHOS  --   --   --   --   --   --  2.6  --    Liver Function Tests: Recent Labs  Lab 10/22/23 1702 10/23/23 0630  AST 274* 246*  ALT 77* 73*  ALKPHOS 101 82  BILITOT 1.4* 1.7*  PROT 7.2 6.7  ALBUMIN  4.1 3.8   No results for input(s): LIPASE, AMYLASE in the last 168 hours. No results for input(s): AMMONIA in the last 168 hours. CBC: Recent Labs  Lab 10/22/23 1702 10/23/23 0630  WBC 3.6* 3.8*  NEUTROABS 1.0* 1.9  HGB 10.8* 10.1*  HCT 32.9* 30.6*  MCV 74.6* 74.6*  PLT 122* 123*   Cardiac Enzymes: No results for input(s): CKTOTAL, CKMB, CKMBINDEX, TROPONINI in the last 168 hours. BNP: BNP (last 3 results) No results for input(s): BNP in the last 8760 hours.  ProBNP  (last 3 results) No results for input(s): PROBNP in the last 8760 hours.  CBG: No results for input(s): GLUCAP in the last 168 hours.  Signed:  Toribio Hummer MD.  [facility] 10/24/2023, 3:11 PM

## 2023-10-24 NOTE — Group Note (Signed)
 Date:  10/24/2023 Time:  9:07 PM  Group Topic/Focus:  Wrap-Up Group:   The focus of this group is to help patients review their daily goal of treatment and discuss progress on daily workbooks.    Participation Level:  Active  Participation Quality:  Appropriate and Attentive  Affect:  Appropriate  Cognitive:  Alert and Appropriate  Insight: Appropriate and Good  Engagement in Group:  Engaged  Modes of Intervention:  Orientation  Additional Comments:     Arlester CHRISTELLA Servant 10/24/2023, 9:07 PM

## 2023-10-24 NOTE — Plan of Care (Signed)
  Problem: Education: Goal: Knowledge of General Education information will improve Description: Including pain rating scale, medication(s)/side effects and non-pharmacologic comfort measures 10/24/2023 1520 by Alaina Dozier PARAS, RN Outcome: Adequate for Discharge 10/24/2023 [phone] by Alaina Dozier PARAS, RN Outcome: Progressing   Problem: Health Behavior/Discharge Planning: Goal: Ability to manage health-related needs will improve 10/24/2023 1520 by Alaina Dozier PARAS, RN Outcome: Adequate for Discharge 10/24/2023 [id number] by Alaina Dozier PARAS, RN Outcome: Progressing   Problem: Clinical Measurements: Goal: Ability to maintain clinical measurements within normal limits will improve 10/24/2023 1520 by Alaina Dozier PARAS, RN Outcome: Adequate for Discharge 10/24/2023 9257 by Alaina Dozier PARAS, RN Outcome: Progressing Goal: Will remain free from infection 10/24/2023 1520 by Alaina Dozier PARAS, RN Outcome: Adequate for Discharge 10/24/2023 9257 by Alaina Dozier PARAS, RN Outcome: Progressing Goal: Diagnostic test results will improve 10/24/2023 1520 by Alaina Dozier PARAS, RN Outcome: Adequate for Discharge 10/24/2023 9257 by Alaina Dozier PARAS, RN Outcome: Progressing Goal: Respiratory complications will improve Outcome: Adequate for Discharge Goal: Cardiovascular complication will be avoided Outcome: Adequate for Discharge   Problem: Activity: Goal: Risk for activity intolerance will decrease Outcome: Adequate for Discharge   Problem: Nutrition: Goal: Adequate nutrition will be maintained Outcome: Adequate for Discharge   Problem: Coping: Goal: Level of anxiety will decrease Outcome: Adequate for Discharge   Problem: Elimination: Goal: Will not experience complications related to bowel motility Outcome: Adequate for Discharge Goal: Will not experience complications related to urinary retention Outcome: Adequate for Discharge   Problem: Pain  Managment: Goal: General experience of comfort will improve and/or be controlled Outcome: Adequate for Discharge   Problem: Safety: Goal: Ability to remain free from injury will improve Outcome: Adequate for Discharge   Problem: Skin Integrity: Goal: Risk for impaired skin integrity will decrease Outcome: Adequate for Discharge

## 2023-10-24 NOTE — Tx Team (Signed)
 Initial Treatment Plan 10/24/2023 6:49 PM Brookelin Felber FMW:[id number]    PATIENT STRESSORS: Marital or family conflict   Occupational concerns   Substance abuse     PATIENT STRENGTHS: Ability for insight  Average or above average intelligence  Capable of independent living  Arboriculturist fund of knowledge  Motivation for treatment/growth  Supportive family/friends  Work skills    PATIENT IDENTIFIED PROBLEMS:                      DISCHARGE CRITERIA:  Ability to meet basic life and health needs Improved stabilization in mood, thinking, and/or behavior Verbal commitment to aftercare and medication compliance Withdrawal symptoms are absent or subacute and managed without 24-hour nursing intervention  PRELIMINARY DISCHARGE PLAN: Attend PHP/IOP Outpatient therapy Return to previous living arrangement Return to previous work or school arrangements  PATIENT/FAMILY INVOLVEMENT: This treatment plan has been presented to and reviewed with the patient, Keryl Gholson. The patient has been given the opportunity to ask questions and make suggestions.  Leita MARLA Rosella, RN 10/24/2023, 6:49 PM

## 2023-10-25 ENCOUNTER — Encounter: Payer: Self-pay | Admitting: Physician Assistant

## 2023-10-25 DIAGNOSIS — F332 Major depressive disorder, recurrent severe without psychotic features: Secondary | ICD-10-CM | POA: Diagnosis not present

## 2023-10-25 NOTE — Group Note (Signed)
 [facility] LCSW Group Therapy Note   Group Date: 10/25/2023 Start Time: 1300 End Time: 1345   Type of Therapy/Topic:  Group Therapy:  Balance in Life  Participation Level:  Active   Description of Group:    This group will address the concept of balance and how it feels and looks when one is unbalanced. Patients will be encouraged to process areas in their lives that are out of balance, and identify reasons for remaining unbalanced. Facilitators will guide patients utilizing problem- solving interventions to address and correct the stressor making their life unbalanced. Understanding and applying boundaries will be explored and addressed for obtaining  and maintaining a balanced life. Patients will be encouraged to explore ways to assertively make their unbalanced needs known to significant others in their lives, using other group members and facilitator for support and feedback.  Therapeutic Goals: Patient will identify two or more emotions or situations they have that consume much of in their lives. Patient will identify signs/triggers that life has become out of balance:  Patient will identify two ways to set boundaries in order to achieve balance in their lives:  Patient will demonstrate ability to communicate their needs through discussion and/or role plays  Summary of Patient Progress: Patient was present for the entirety of the group process. She was engaged in the discussion and her comments were pertinent. Pt appeared to have significant insight into herself and the topic. She appeared to be open and receptive to feedback/comments from both her peers and the facilitator.   Therapeutic Modalities:   Cognitive Behavioral Therapy Solution-Focused Therapy Assertiveness Training   Nadara JONELLE Fam, LCSW

## 2023-10-25 NOTE — BHH Counselor (Signed)
 CSW contacted [facility] for referral for the patient.   CSW spoke with [facility].  CSW was informed that there IOP program is for mental health as opposed to SUD.  CSW will need to follow up with the patient and reassess.   Sherryle Margo, MSW, LCSW 10/25/2023 1:25 PM

## 2023-10-25 NOTE — Progress Notes (Signed)
   10/24/23 2200  Psych Admission Type (Psych Patients Only)  Admission Status Involuntary  Psychosocial Assessment  Patient Complaints None  Eye Contact Fair  Facial Expression Animated  Affect Appropriate to circumstance  Speech Logical/coherent  Interaction Assertive  Motor Activity Slow  Appearance/Hygiene Unremarkable  Behavior Characteristics Appropriate to situation;Cooperative  Mood Depressed;Anxious  Thought Process  Coherency WDL  Content WDL  Delusions None reported or observed  Perception WDL  Hallucination None reported or observed  Judgment WDL  Confusion None  Danger to Self  Current suicidal ideation? Denies  Agreement Not to Harm Self Yes  Description of Agreement Verbal  Danger to Others  Danger to Others None reported or observed

## 2023-10-25 NOTE — Progress Notes (Signed)
 Pt agreed and took some of her am meds.  Vital signs taken.  See flowsheet for vital signs.  Then pt felt good enough to join the group out in the courtyard to get some fresh air and exercise.
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
## 2023-10-25 NOTE — BHH Suicide Risk Assessment (Signed)
 BHH INPATIENT:  Family/Significant Other Suicide Prevention Education  Suicide Prevention Education:  Patient Refusal for Family/Significant Other Suicide Prevention Education: The patient Charlene [name] has refused to provide written consent for family/significant other to be provided Family/Significant Other Suicide Prevention Education during admission and/or prior to discharge.  Physician notified. SPE completed with pt, as pt refused to consent to family contact. SPI pamphlet provided to pt and pt was encouraged to share information with support network, ask questions, and talk about any concerns relating to SPE. Pt denies access to guns/firearms and verbalized understanding of information provided. Mobile Crisis information also provided to pt.    [name] 10/25/2023, 12:18 PM

## 2023-10-25 NOTE — Progress Notes (Signed)
   10/25/23 2100  Psych Admission Type (Psych Patients Only)  Admission Status Involuntary  Psychosocial Assessment  Patient Complaints None  Eye Contact Fair  Facial Expression Animated  Affect Appropriate to circumstance  Speech Logical/coherent  Interaction Assertive  Motor Activity Slow  Appearance/Hygiene Unremarkable  Behavior Characteristics Appropriate to situation  Mood Depressed  Thought Process  Coherency WDL  Content WDL  Delusions None reported or observed  Perception WDL  Hallucination None reported or observed  Judgment WDL  Confusion None  Danger to Self  Current suicidal ideation? Denies  Agreement Not to Harm Self Yes  Description of Agreement verbal  Danger to Others  Danger to Others None reported or observed

## 2023-10-25 NOTE — Progress Notes (Signed)
   10/25/23 0900  Psych Admission Type (Psych Patients Only)  Admission Status Involuntary  Psychosocial Assessment  Patient Complaints Nervousness;Substance abuse  Eye Contact Fair  Facial Expression Animated  Affect Appropriate to circumstance  Speech Logical/coherent  Interaction Assertive  Motor Activity Slow  Appearance/Hygiene Unremarkable  Behavior Characteristics Guarded  Mood Depressed;Anxious  Aggressive Behavior  Effect Self-harm (OD'd on her home med, hydroxyzine ; developed prolonged QT as a result)  Thought Process  Coherency WDL  Content WDL  Delusions None reported or observed  Perception WDL  Hallucination None reported or observed  Judgment WDL  Confusion None  Danger to Self  Current suicidal ideation? Denies  Agreement Not to Harm Self Yes  Description of Agreement Verbal  Danger to Others  Danger to Others None reported or observed

## 2023-10-25 NOTE — Group Note (Signed)
 Date:  10/25/2023 Time:  11:18 PM  Group Topic/Focus:  Coping With Mental Health Crisis:   The purpose of this group is to help patients identify strategies for coping with mental health crisis.  Group discusses possible causes of crisis and ways to manage them effectively.    Participation Level:  Active  Participation Quality:  Appropriate  Affect:  Appropriate  Cognitive:  Appropriate  Insight: Appropriate  Engagement in Group:  Engaged  Modes of Intervention:  Education  Additional Comments:    Charlene [name] 10/25/2023, 11:18 PM

## 2023-10-25 NOTE — Plan of Care (Signed)
   Problem: Safety: Goal: Periods of time without injury will increase Outcome: Progressing

## 2023-10-25 NOTE — Group Note (Signed)
 Recreation Therapy Group Note   Group Topic:Coping Skills  Group Date: 10/25/2023 Start Time: 1000 End Time: 1055 Facilitators: Celestia Jeoffrey BRAVO, LRT, CTRS Location: Craft Room  Group Description: Mind Map.  Patient was provided a blank template of a diagram with 32 blank boxes in a tiered system, branching from the center (similar to a bubble chart). LRT directed patients to label the middle of the diagram Coping Skills. LRT and patients then came up with 8 different coping skills as examples. Pt were directed to record their coping skills in the 2nd tier boxes closest to the center.  Patients would then share their coping skills with the group as LRT wrote them out. LRT gave a handout of 99 different coping skills at the end of group.   Goal Area(s) Addressed: Patients will be able to define "coping skills". Patient will identify new coping skills.  Patient will increase communication.  Affect/Mood: Appropriate   Participation Level: Active and Engaged   Participation Quality: Independent   Behavior: Appropriate, Calm, and Cooperative   Speech/Thought Process: Directed   Insight: Good   Judgement: Good   Modes of Intervention: Clarification, Education, Worksheet, and Writing   Patient Response to Interventions:  Attentive, Engaged, Interested , and Receptive   Education Outcome:  Acknowledges education   Clinical Observations/Individualized Feedback: Charlene [name] was active in their participation of session activities and group discussion. Pt identified massage and cook as coping skills.    Plan: Continue to engage patient in RT group sessions 2-3x/week.   Jeoffrey BRAVO Celestia, LRT, CTRS 10/25/2023 11:34 AM

## 2023-10-25 NOTE — Group Note (Signed)
 Recreation Therapy Group Note   Group Topic:General Recreation  Group Date: 10/25/2023 Start Time: 1520 End Time: 1620 Facilitators: Celestia Jeoffrey BRAVO, LRT, CTRS Location: Courtyard  Group Description: [employer]. LRT and patients played games of basketball, drew with chalk, and played corn hole while outside in the courtyard while getting fresh air and sunlight. Music was being played in the background. LRT and peers conversed about different games they have played before, what they do in their free time and anything else that is on their minds. LRT encouraged pts to drink water after being outside, sweating and getting their heart rate up.  Goal Area(s) Addressed: Patient will build on frustration tolerance skills. Patients will partake in a competitive play game with peers. Patients will gain knowledge of new leisure interest/hobby.   Affect/Mood: Appropriate   Participation Level: Active   Participation Quality: Independent   Behavior: Appropriate   Speech/Thought Process: Coherent   Insight: Good   Judgement: Good   Modes of Intervention: Activity   Patient Response to Interventions:  Receptive   Education Outcome:  Acknowledges education   Clinical Observations/Individualized Feedback: Charlene [name] was active in their participation of session activities and group discussion. Pt interacted well with LRT and peers duration of session.    Plan: Continue to engage patient in RT group sessions 2-3x/week.   Jeoffrey BRAVO Celestia, LRT, CTRS 10/25/2023 5:30 PM

## 2023-10-25 NOTE — [facility] Suicide Risk Assessment (Signed)
 [facility] Admission Suicide Risk Assessment   Nursing information obtained from:  Patient Demographic factors:  Divorced or widowed Current Mental Status:  NA Loss Factors:  NA Historical Factors:  Family history of suicide, Family history of mental illness or substance abuse Risk Reduction Factors:  Responsible for children under 51 years of age, Sense of responsibility to family, Employed, Living with another person, especially a relative, Positive social support, Positive therapeutic relationship  Total Time spent with patient: 30 minutes Principal Problem: MDD (major depressive disorder), recurrent episode, severe (HCC) Diagnosis:  Principal Problem:   MDD (major depressive disorder), recurrent episode, severe (HCC)  Subjective Data: This is a 53 year old female with history of depression, anxiety, and alcohol abuse who presented to ED following intentional overdose of hydroxyzine .  Presenting issues include worsening depression, feelings of overwhelm, and anxiety.  She does not currently take psychotropic medication.  Family history is significant for depression in mom and sister and suicide in sister.  Patient is admitted to the adult inpatient unit with every 15-minute safety monitoring.  Multidisciplinary team approach was offered.  Medication management, group/milieu therapy is offered.  Continued Clinical Symptoms:  Alcohol Use Disorder Identification Test Final Score (AUDIT): 20 The Alcohol Use Disorders Identification Test, Guidelines for Use in Primary Care, Second Edition.  World Science writer [facility]). Score between 0-7:  no or low risk or alcohol related problems. Score between 8-15:  moderate risk of alcohol related problems. Score between 16-19:  high risk of alcohol related problems. Score 20 or above:  warrants further diagnostic evaluation for alcohol dependence and treatment.   CLINICAL FACTORS:   Depression:   Comorbid alcohol  abuse/dependence   Musculoskeletal: Strength & Muscle Tone: within normal limits Gait & Station: normal Patient leans: N/A  Psychiatric Specialty Exam:  Presentation  General Appearance:  Appropriate for Environment  Eye Contact: Fair  Speech: Clear and Coherent  Speech Volume: Normal  Handedness:No data recorded  Mood and Affect  Mood: Depressed  Affect: Congruent   Thought Process  Thought Processes: Coherent  Descriptions of Associations:Intact  Orientation:Full (Time, Place and Person)  Thought Content:WDL  History of Schizophrenia/Schizoaffective disorder:No data recorded Duration of Psychotic Symptoms:No data recorded Hallucinations:Hallucinations: None  Ideas of Reference:None  Suicidal Thoughts:Suicidal Thoughts: No  Homicidal Thoughts:Homicidal Thoughts: No   Sensorium  Memory: Immediate Fair; Recent Fair; Remote Fair  Judgment: Fair  Insight: Poor   Executive Functions  Concentration: Fair  Attention Span: Fair  Recall: Fair  Fund of Knowledge: Fair  Language: Fair   Psychomotor Activity  Psychomotor Activity: Psychomotor Activity: Normal   Assets  Assets: Manufacturing systems engineer; Vocational/Educational; Housing; Social Support   Sleep  Sleep: Sleep: Fair    Physical Exam: Physical Exam ROS Blood pressure (!) 147/70, pulse 93, temperature 98.9 F (37.2 C), temperature source Oral, resp. rate 16, height 5' 10 (1.778 m), weight 126.6 kg, last menstrual period 09/17/2019, SpO2 100%. Body mass index is 40.03 kg/m.   COGNITIVE FEATURES THAT CONTRIBUTE TO RISK:  None    SUICIDE RISK:   Severe:  Frequent, intense, and enduring suicidal ideation, specific plan, no subjective intent, but some objective markers of intent (i.e., choice of lethal method), the method is accessible, some limited preparatory behavior, evidence of impaired self-control, severe dysphoria/symptomatology, multiple risk factors present,  and few if any protective factors, particularly a lack of social support.  PLAN OF CARE: Patient is admitted to the adult inpatient unit with every 15-minute safety monitoring.  Multidisciplinary team approach was offered.  Medication  management, group/milieu therapy is offered.  I certify that inpatient services furnished can reasonably be expected to improve the patient's condition.   Jett Kulzer L Esa Raden, PA-C 10/25/2023, 1:25 PM

## 2023-10-25 NOTE — [facility] Group Notes (Signed)
 Spirituality Group   Focus of discussion: Gratitude and Strength Awareness   Process: Following theoretical framework of group therapy of Irvin Yalom and further informed by Rogerian and Relational Cultural Theory approaches, participants invited to name:   Sources of gratitude (internal>external)   Articulate gratitude for self   Name a personal strength/gift/skill   Locate points of resonance among group members/engage the here and now   Conclude with grounding/breathwork   Observations: Charlene [name] was an active participant in the group discussion. She named her helping nature as a strength and source of purpose.  [name] L. [name].Div

## 2023-10-25 NOTE — Plan of Care (Signed)
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
## 2023-10-25 NOTE — Progress Notes (Signed)
 Pt refusing all meds until bloodwork is drawn including bp meds.  Provider made aware.

## 2023-10-26 DIAGNOSIS — F332 Major depressive disorder, recurrent severe without psychotic features: Secondary | ICD-10-CM | POA: Diagnosis not present

## 2023-10-26 LAB — BASIC METABOLIC PANEL WITH GFR
Anion gap: 10 (ref 5–15)
BUN: 9 mg/dL (ref 6–20)
CO2: 21 mmol/L — ABNORMAL LOW (ref 22–32)
Calcium: 8.9 mg/dL (ref 8.9–10.3)
Chloride: 107 mmol/L (ref 98–111)
Creatinine, Ser: 0.85 mg/dL (ref 0.44–1.00)
GFR, Estimated: >60 mL/min (ref >60)
Glucose, Bld: 146 mg/dL — ABNORMAL HIGH (ref 70–99)
Potassium: 3.5 mmol/L (ref 3.5–5.1)
Sodium: 138 mmol/L (ref 135–145)

## 2023-10-26 MED ORDER — VITAMINS A & D EX OINT
TOPICAL_OINTMENT | CUTANEOUS | Status: DC | PRN
  Administered 2023-10-27 – 2023-10-29 (×4): 42.5 via TOPICAL
  Filled 2023-10-26: qty 1

## 2023-10-26 MED ORDER — LAMOTRIGINE 25 MG PO TABS
25.0000 mg | ORAL_TABLET | Freq: Every day | ORAL | Status: DC
  Administered 2023-10-27 – 2023-10-28 (×2): 25 mg via ORAL
  Filled 2023-10-26 (×2): qty 1

## 2023-10-26 NOTE — Progress Notes (Signed)
 [facility] MD Progress Note  10/26/2023 8:53 AM Charlene [name]  MRN:  [id number]   Subjective:  Chart reviewed, case discussed in multidisciplinary meeting, patient seen during rounds.   10/24: On interview today, patient is found resting in her room.  She notes 1 transient episode of lightheadedness overnight but denies any further episodes today.  She rates depression as 5 out of 10 and anxiety is 5 out of 10 today.  She denies SI/HI/plan and denies hallucinations.  She identifies one of her goals of treatment is to work on Pharmacologist.  She is considering IOP upon discharge.  She denies alcohol withdrawal symptoms.  She has been actively participating in group therapies.  She is tolerating current medication regimen well without adverse effects.  She is agreeable to adding Lamictal , which she states she has previously done well on.  Sleep: Fair  Appetite:  Good  Past Psychiatric History: see h&P Family History:  Family History  Problem Relation Age of Onset   Hypertension Mother    Heart disease Father    Hypertension Father    Social History:  Social History   Substance and Sexual Activity  Alcohol Use Yes   Comment: patient reports drinking 3-4 shots of vodka or 2-3 glasses of wine most days     Social History   Substance and Sexual Activity  Drug Use No    Social History   Socioeconomic History   Marital status: Divorced    Spouse name: Not on file   Number of children: Not on file   Years of education: Not on file   Highest education level: Not on file  Occupational History   Not on file  Tobacco Use   Smoking status: Never   Smokeless tobacco: Never  Vaping Use   Vaping status: Never Used  Substance and Sexual Activity   Alcohol use: Yes    Comment: patient reports drinking 3-4 shots of vodka or 2-3 glasses of wine most days   Drug use: No   Sexual activity: Yes    Birth control/protection: I.U.D.  Other Topics Concern   Not on file  Social History  Narrative   Not on file   Social Drivers of Health   Financial Resource Strain: Not on file  Food Insecurity: No Food Insecurity (10/24/2023)   Hunger Vital Sign    Worried About Running Out of Food in the Last Year: Never true    Ran Out of Food in the Last Year: Never true  Transportation Needs: No Transportation Needs (10/24/2023)   PRAPARE - Administrator, Civil Service (Medical): No    Lack of Transportation (Non-Medical): No  Physical Activity: Not on file  Stress: Not on file (02/11/2019)  Social Connections: Unknown (05/13/2021)   Received from [facility]   Social Network    Social Network: Not on file   Past Medical History:  Past Medical History:  Diagnosis Date   Anemia    Hypertension     Past Surgical History:  Procedure Laterality Date   GASTRIC BYPASS  2000   HERNIA REPAIR  2004   ROBOTIC ASSISTED LAPAROSCOPIC HYSTERECTOMY AND SALPINGECTOMY Bilateral 11/17/2019   Procedure: XI ROBOTIC ASSISTED LAPAROSCOPIC HYSTERECTOMY AND BILATERAL SALPINGECTOMY;  Surgeon: Kandyce Sor, MD;  Location: [facility];  Service: Gynecology;  Laterality: Bilateral;   VENTRAL HERNIA REPAIR  2004    Current Medications: Current Facility-Administered Medications  Medication Dose Route Frequency Provider Last Rate Last Admin   acetaminophen  (TYLENOL )  tablet 650 mg  650 mg Oral Q6H PRN Starkes-Perry, Takia S, FNP       alum & mag hydroxide-simeth (MAALOX/MYLANTA) 200-200-20 MG/5ML suspension 30 mL  30 mL Oral Q4H PRN Starkes-Perry, Takia S, FNP       amLODipine (NORVASC) tablet 5 mg  5 mg Oral Daily Starkes-Perry, Takia S, FNP   5 mg at 10/25/23 1542   buPROPion  (WELLBUTRIN  XL) 24 hr tablet 150 mg  150 mg Oral Daily Starkes-Perry, Takia S, FNP   150 mg at 10/25/23 1542   cholecalciferol  (VITAMIN D3) 25 MCG (1000 UNIT) tablet 1,000 Units  1,000 Units Oral Daily Starkes-Perry, Takia S, FNP   1,000 Units at 10/25/23 1543   haloperidol (HALDOL) tablet 5 mg  5  mg Oral TID PRN Wilkie Majel RAMAN, FNP       And   diphenhydrAMINE  (BENADRYL ) capsule 50 mg  50 mg Oral TID PRN Wilkie Majel RAMAN, FNP       haloperidol lactate (HALDOL) injection 5 mg  5 mg Intramuscular TID PRN Wilkie Majel RAMAN, FNP       And   diphenhydrAMINE  (BENADRYL ) injection 50 mg  50 mg Intramuscular TID PRN Starkes-Perry, Majel RAMAN, FNP       And   LORazepam  (ATIVAN ) injection 2 mg  2 mg Intramuscular TID PRN Starkes-Perry, Majel RAMAN, FNP       haloperidol lactate (HALDOL) injection 10 mg  10 mg Intramuscular TID PRN Wilkie Majel RAMAN, FNP       And   diphenhydrAMINE  (BENADRYL ) injection 50 mg  50 mg Intramuscular TID PRN Starkes-Perry, Majel RAMAN, FNP       And   LORazepam  (ATIVAN ) injection 2 mg  2 mg Intramuscular TID PRN Starkes-Perry, Takia S, FNP       levothyroxine  (SYNTHROID ) tablet 75 mcg  75 mcg Oral Q0600 Wilkie Majel RAMAN, FNP   75 mcg at 10/26/23 0559   LORazepam  (ATIVAN ) injection 0-4 mg  0-4 mg Intravenous Q12H Wilkie Majel RAMAN, FNP       Or   LORazepam  (ATIVAN ) tablet 0-4 mg  0-4 mg Oral Q12H Starkes-Perry, Majel RAMAN, FNP       magnesium hydroxide (MILK OF MAGNESIA) suspension 30 mL  30 mL Oral Daily PRN Starkes-Perry, Takia S, FNP       multivitamin with minerals tablet 1 tablet  1 tablet Oral Daily Starkes-Perry, Takia S, FNP       thiamine (VITAMIN B1) tablet 100 mg  100 mg Oral Daily Starkes-Perry, Majel RAMAN, FNP       Or   thiamine (VITAMIN B1) injection 100 mg  100 mg Intravenous Daily Wilkie Majel RAMAN, FNP        Lab Results:  Results for orders placed or performed during the hospital encounter of 10/22/23 (from the past 48 hours)  Basic metabolic panel     Status: Abnormal   Collection Time: 10/24/23 12:14 PM  Result Value Ref Range   Sodium 136 135 - 145 mmol/L   Potassium 4.4 3.5 - 5.1 mmol/L    Comment: Repeated to verify  Delta check noted     Chloride 103 98 - 111 mmol/L   CO2 21 (L) 22 - 32 mmol/L   Glucose, Bld 96  70 - 99 mg/dL    Comment: Glucose reference range applies only to samples taken after fasting for at least 8 hours.   BUN 9 6 - 20 mg/dL   Creatinine, Ser 9.26 0.44 - 1.00 mg/dL  Calcium 9.1 8.9 - 10.3 mg/dL   GFR, Estimated >39 >39 mL/min    Comment: (NOTE) Calculated using the CKD-EPI Creatinine Equation (2021)    Anion gap 11 5 - 15    Comment: Performed at [facility], [address], [place], [place] [address]    Blood Alcohol level:  Lab Results  Component Value Date   ETH 332 [facility]) 10/22/2023   ETH 260 (H) 10/07/2021    Metabolic Disorder Labs: No results found for: HGBA1C, MPG No results found for: PROLACTIN No results found for: CHOL, TRIG, HDL, CHOLHDL, VLDL, LDLCALC  Physical Findings: AIMS:  , ,  ,  ,    CIWA:  CIWA-Ar Total: 0 COWS:      Psychiatric Specialty Exam:  Presentation  General Appearance:  Appropriate for Environment  Eye Contact: Fair  Speech: Clear and Coherent  Speech Volume: Normal    Mood and Affect  Mood: Depressed  Affect: Congruent   Thought Process  Thought Processes: Coherent  Orientation:Full (Time, Place and Person)  Thought Content:WDL  Hallucinations:Hallucinations: None  Ideas of Reference:None  Suicidal Thoughts:Suicidal Thoughts: No  Homicidal Thoughts:Homicidal Thoughts: No   Sensorium  Memory: Immediate Fair; Recent Fair; Remote Fair  Judgment: Fair  Insight: Poor   Executive Functions  Concentration: Fair  Attention Span: Fair  Recall: Fair  Fund of Knowledge: Fair  Language: Fair   Psychomotor Activity  Psychomotor Activity: Psychomotor Activity: Normal  Musculoskeletal: Strength & Muscle Tone: within normal limits Gait & Station: normal Assets  Assets: Manufacturing systems engineer; Vocational/Educational; Housing; Social Support    Physical Exam: Physical Exam ROS Blood pressure (!) 151/99, pulse 100, temperature 98.6 F (37  C), temperature source Oral, resp. rate 20, height 5' 10 (1.778 m), weight 126.6 kg, last menstrual period 09/17/2019, SpO2 100%. Body mass index is 40.03 kg/m.  Diagnosis: Principal Problem:   MDD (major depressive disorder), recurrent severe, without psychosis (HCC)   PLAN: Safety and Monitoring:  -- Voluntary admission to inpatient psychiatric unit for safety, stabilization and treatment  -- Daily contact with patient to assess and evaluate symptoms and progress in treatment  -- Patient's case to be discussed in multi-disciplinary team meeting  -- Observation Level : q15 minute checks  -- Vital signs:  q12 hours  -- Precautions: suicide, elopement, and assault -- Encouraged patient to participate in unit milieu and in scheduled group therapies   2. Psychiatric Treatment:  Scheduled Medications: Wellbutrin  XL 150 mg Lamictal  25 mg once daily CIWA protocol     -- The risks/benefits/side-effects/alternatives to this medication were discussed in detail with the patient and time was given for questions. The patient consents to medication trial.   3. Medical Issues Being Addressed:  Amlodipine for hypertension Synthroid  for hypothyroidism A+D ointment as barrier cream for inframammary skin folds Repeated BMP today per recommendation of internal medicine   4. Discharge Planning:   -- Social work and case management to assist with discharge planning and identification of hospital follow-up needs prior to discharge  -- Estimated LOS: 5-7 days  Camelia LITTIE Lukes, PA-C 10/26/2023, 8:53 AM

## 2023-10-26 NOTE — Group Note (Signed)
 Date:  10/26/2023 Time:  5:49 PM  Group Topic/Focus:  Wellness Toolbox:   The focus of this group is to discuss various aspects of wellness, balancing those aspects and exploring ways to increase the ability to experience wellness.  Patients will create a wellness toolbox for use upon discharge.    Participation Level:  Active  Participation Quality:  Appropriate  Affect:  Appropriate  Cognitive:  Appropriate  Insight: Appropriate  Engagement in Group:  Engaged  Modes of Intervention:  Activity and Socialization  Additional Comments:    [name] 10/26/2023, 5:49 PM

## 2023-10-26 NOTE — Group Note (Signed)
 Recreation Therapy Group Note   Group Topic:Leisure Education  Group Date: 10/26/2023 Start Time: 1100 End Time: 1150 Facilitators: Celestia Jeoffrey BRAVO, LRT, CTRS Location: Craft Room  Group Description: Leisure. Patients were given the option to choose from journaling, coloring, drawing, making origami, playing with playdoh, listening to music or singing karaoke. LRT and pts discussed the meaning of leisure, the importance of participating in leisure during their free time/when they're outside of the hospital, as well as how our leisure interests can also serve as coping skills.   Goal Area(s) Addressed:  Patient will identify a current leisure interest.  Patient will learn the definition of "leisure". Patient will practice making a positive decision. Patient will have the opportunity to try a new leisure activity. Patient will communicate with peers and LRT.   Affect/Mood: Appropriate   Participation Level: Active and Engaged   Participation Quality: Independent   Behavior: Calm and Cooperative   Speech/Thought Process: Coherent   Insight: Good   Judgement: Good   Modes of Intervention: Education, Exploration, and Music   Patient Response to Interventions:  Attentive, Engaged, Interested , and Receptive   Education Outcome:  Acknowledges education   Clinical Observations/Individualized Feedback: Corita was active in their participation of session activities and group discussion. Pt identified cook and watch movies as things she does in her free time.   Plan: Continue to engage patient in RT group sessions 2-3x/week.   Jeoffrey BRAVO Celestia, LRT, CTRS 10/26/2023 1:17 PM

## 2023-10-26 NOTE — BH IP Treatment Plan (Signed)
 Interdisciplinary Treatment and Diagnostic Plan Update  10/26/2023 Time of Session: 10:52 AM Charlene [name] MRN: [id number]  Principal Diagnosis: MDD (major depressive disorder), recurrent severe, without psychosis (HCC)  Secondary Diagnoses: Principal Problem:   MDD (major depressive disorder), recurrent severe, without psychosis (HCC)   Current Medications:  Current Facility-Administered Medications  Medication Dose Route Frequency Provider Last Rate Last Admin   acetaminophen  (TYLENOL ) tablet 650 mg  650 mg Oral Q6H PRN Starkes-Perry, Takia S, FNP       alum & mag hydroxide-simeth (MAALOX/MYLANTA) 200-200-20 MG/5ML suspension 30 mL  30 mL Oral Q4H PRN Starkes-Perry, Majel RAMAN, FNP       amLODipine (NORVASC) tablet 5 mg  5 mg Oral Daily Starkes-Perry, Takia S, FNP   5 mg at 10/26/23 1005   buPROPion  (WELLBUTRIN  XL) 24 hr tablet 150 mg  150 mg Oral Daily Wilkie Majel RAMAN, FNP   150 mg at 10/26/23 1005   cholecalciferol  (VITAMIN D3) 25 MCG (1000 UNIT) tablet 1,000 Units  1,000 Units Oral Daily Wilkie Majel RAMAN, FNP   1,000 Units at 10/26/23 1005   haloperidol (HALDOL) tablet 5 mg  5 mg Oral TID PRN Wilkie Majel RAMAN, FNP   5 mg at 10/26/23 1005   And   diphenhydrAMINE  (BENADRYL ) capsule 50 mg  50 mg Oral TID PRN Wilkie Majel RAMAN, FNP   50 mg at 10/26/23 1005   haloperidol lactate (HALDOL) injection 5 mg  5 mg Intramuscular TID PRN Wilkie Majel RAMAN, FNP       And   diphenhydrAMINE  (BENADRYL ) injection 50 mg  50 mg Intramuscular TID PRN Starkes-Perry, Majel RAMAN, FNP       And   LORazepam  (ATIVAN ) injection 2 mg  2 mg Intramuscular TID PRN Starkes-Perry, Majel RAMAN, FNP       haloperidol lactate (HALDOL) injection 10 mg  10 mg Intramuscular TID PRN Wilkie Majel RAMAN, FNP       And   diphenhydrAMINE  (BENADRYL ) injection 50 mg  50 mg Intramuscular TID PRN Starkes-Perry, Majel RAMAN, FNP       And   LORazepam  (ATIVAN ) injection 2 mg  2 mg Intramuscular TID PRN  Starkes-Perry, Takia S, FNP       levothyroxine  (SYNTHROID ) tablet 75 mcg  75 mcg Oral Q0600 Wilkie Majel RAMAN, FNP   75 mcg at 10/26/23 0559   LORazepam  (ATIVAN ) injection 0-4 mg  0-4 mg Intravenous Q12H Wilkie Majel RAMAN, FNP       Or   LORazepam  (ATIVAN ) tablet 0-4 mg  0-4 mg Oral Q12H Starkes-Perry, Majel RAMAN, FNP       magnesium hydroxide (MILK OF MAGNESIA) suspension 30 mL  30 mL Oral Daily PRN Starkes-Perry, Takia S, FNP       multivitamin with minerals tablet 1 tablet  1 tablet Oral Daily Wilkie Majel RAMAN, FNP   1 tablet at 10/26/23 1005   thiamine (VITAMIN B1) tablet 100 mg  100 mg Oral Daily Starkes-Perry, Takia S, FNP   100 mg at 10/26/23 1005   Or   thiamine (VITAMIN B1) injection 100 mg  100 mg Intravenous Daily Wilkie Majel RAMAN, FNP       PTA Medications: Medications Prior to Admission  Medication Sig Dispense Refill Last Dose/Taking   acetaminophen  (TYLENOL ) 500 MG tablet Take 2 tablets (1,000 mg total) by mouth every 6 (six) hours as needed. (Patient not taking: Reported on 10/23/2023) 60 tablet 1    amLODipine (NORVASC) 5 MG tablet Take 5 mg by mouth daily. (  Patient not taking: Reported on 10/23/2023)      [Paused] buPROPion  (WELLBUTRIN  XL) 150 MG 24 hr tablet TAKE 1 TABLET BY MOUTH EVERY DAY (Patient not taking: Reported on 10/23/2023) 90 tablet 1    cholecalciferol  (VITAMIN D3) 25 MCG (1000 UNIT) tablet Take 1,000 Units by mouth daily. (Patient not taking: Reported on 10/23/2023)      cyanocobalamin (,VITAMIN B-12,) 1000 MCG/ML injection Inject 1,000 mcg into the muscle every 30 (thirty) days.  (Patient not taking: Reported on 10/23/2023)      folic acid (FOLVITE) 1 MG tablet Take 1 tablet (1 mg total) by mouth daily.      hydrochlorothiazide (MICROZIDE) 12.5 MG capsule Take 12.5 mg by mouth daily. (Patient not taking: Reported on 10/23/2023)      [Paused] hydrOXYzine  (ATARAX ) 25 MG tablet TAKE 1 TO 2 TABLETS BY MOUTH AT BEDTIME (Patient not taking:  Reported on 10/23/2023) 60 tablet 2    [Paused] lamoTRIgine  (LAMICTAL ) 200 MG tablet Take one tablet at bedtime. (Patient not taking: Reported on 10/23/2023) 90 tablet 1    levothyroxine  (SYNTHROID ) 75 MCG tablet Take 75 mcg by mouth daily before breakfast.  (Patient not taking: Reported on 10/23/2023)      Multiple Vitamin (MULTIVITAMIN WITH MINERALS) TABS tablet Take 1 tablet by mouth daily.      [MAR Hold] Potassium 99 MG TABS Take 1 tablet (99 mg total) by mouth every other day.      thiamine (VITAMIN B-1) 100 MG tablet Take 1 tablet (100 mg total) by mouth daily.      [Paused] Vitamin D , Ergocalciferol , (DRISDOL) 50000 units CAPS capsule Take 1 capsule once a week by mouth.  (Patient not taking: Reported on 11/06/2019)       Patient Stressors: Marital or family conflict   Occupational concerns   Substance abuse    Patient Strengths: Ability for insight  Average or above average intelligence  Capable of independent living  Arboriculturist fund of knowledge  Motivation for treatment/growth  Supportive family/friends  Work skills   Treatment Modalities: Medication Management, Group therapy, Case management,  1 to 1 session with clinician, Psychoeducation, Recreational therapy.   Physician Treatment Plan for Primary Diagnosis: MDD (major depressive disorder), recurrent severe, without psychosis (HCC) Long Term Goal(s): Improvement in symptoms so as ready for discharge   Short Term Goals: Ability to identify changes in lifestyle to reduce recurrence of condition will improve Ability to disclose and discuss suicidal ideas Ability to identify and develop effective coping behaviors will improve Ability to identify triggers associated with substance abuse/mental health issues will improve  Medication Management: Evaluate patient's response, side effects, and tolerance of medication regimen.  Therapeutic Interventions: 1 to 1 sessions, Unit Group sessions  and Medication administration.  Evaluation of Outcomes: Not Met  Physician Treatment Plan for Secondary Diagnosis: Principal Problem:   MDD (major depressive disorder), recurrent severe, without psychosis (HCC)  Long Term Goal(s): Improvement in symptoms so as ready for discharge   Short Term Goals: Ability to identify changes in lifestyle to reduce recurrence of condition will improve Ability to disclose and discuss suicidal ideas Ability to identify and develop effective coping behaviors will improve Ability to identify triggers associated with substance abuse/mental health issues will improve     Medication Management: Evaluate patient's response, side effects, and tolerance of medication regimen.  Therapeutic Interventions: 1 to 1 sessions, Unit Group sessions and Medication administration.  Evaluation of Outcomes: Not Met   RN Treatment  Plan for Primary Diagnosis: MDD (major depressive disorder), recurrent severe, without psychosis (HCC) Long Term Goal(s): Knowledge of disease and therapeutic regimen to maintain health will improve  Short Term Goals: Ability to verbalize frustration and anger appropriately will improve, Ability to demonstrate self-control, Ability to participate in decision making will improve, Ability to verbalize feelings will improve, Ability to disclose and discuss suicidal ideas, and Ability to identify and develop effective coping behaviors will improve  Medication Management: RN will administer medications as ordered by provider, will assess and evaluate patient's response and provide education to patient for prescribed medication. RN will report any adverse and/or side effects to prescribing provider.  Therapeutic Interventions: 1 on 1 counseling sessions, Psychoeducation, Medication administration, Evaluate responses to treatment, Monitor vital signs and CBGs as ordered, Perform/monitor CIWA, COWS, AIMS and Fall Risk screenings as ordered, Perform wound care  treatments as ordered.  Evaluation of Outcomes: Not Met   LCSW Treatment Plan for Primary Diagnosis: MDD (major depressive disorder), recurrent severe, without psychosis (HCC) Long Term Goal(s): Safe transition to appropriate next level of care at discharge, Engage patient in therapeutic group addressing interpersonal concerns.  Short Term Goals: Engage patient in aftercare planning with referrals and resources, Increase social support, Increase ability to appropriately verbalize feelings, Increase emotional regulation, Facilitate acceptance of mental health diagnosis and concerns, Facilitate patient progression through stages of change regarding substance use diagnoses and concerns, Identify triggers associated with mental health/substance abuse issues, and Increase skills for wellness and recovery  Therapeutic Interventions: Assess for all discharge needs, 1 to 1 time with Social worker, Explore available resources and support systems, Assess for adequacy in community support network, Educate family and significant other(s) on suicide prevention, Complete Psychosocial Assessment, Interpersonal group therapy.  Evaluation of Outcomes: Not Met   Progress in Treatment: Attending groups: Yes. Participating in groups: Yes. Taking medication as prescribed: Yes. Toleration medication: Yes. Family/Significant other contact made: No, will contact:  Patient declined contact.  Patient understands diagnosis: Yes. Discussing patient identified problems/goals with staff: Yes. Medical problems stabilized or resolved: Yes. Denies suicidal/homicidal ideation: Yes. Issues/concerns per patient self-inventory: No. Other: None  New problem(s) identified: No, Describe:  None  New Short Term/Long Term Goal(s):detox, elimination of symptoms of psychosis, medication management for mood stabilization; elimination of SI thoughts; development of comprehensive mental wellness/sobriety plan.    Patient Goals:   Finding ways to deal with stress.  Discharge Plan or Barriers: CSW to assist with the development of appropriate discharge plan.    Reason for Continuation of Hospitalization: Anxiety Depression Suicidal ideation  Estimated Length of Stay: 1-7 days.  Last 3 [place] Suicide Severity Risk Score: Flowsheet Row Admission (Current) from 10/24/2023 in [facility] INPATIENT BEH[facility]AL MEDICINE ED to Hosp-Admission (Discharged) from 10/22/2023 in [place] LONG 4TH FLOOR PROGRESSIVE CARE AND UROLOGY ED from 10/07/2021 in [facility] Emergency Department at [facility]  C-SSRS RISK CATEGORY High Risk High Risk No Risk    Last PHQ 2/9 Scores:     No data to display          Scribe for Treatment Team: Kevante Lunt M Cintya Daughety, LCSW 10/26/2023 11:13 AM

## 2023-10-26 NOTE — Plan of Care (Signed)

## 2023-10-26 NOTE — Group Note (Signed)
 Recreation Therapy Group Note   Group Topic:Health and Wellness  Group Date: 10/26/2023 Start Time: 1510 End Time: 1600 Facilitators: Celestia Jeoffrey BRAVO, LRT, CTRS Location: Courtyard  Group Description: [employer]. LRT and patients played games of basketball, drew with chalk, and played corn hole while outside in the courtyard while getting fresh air and sunlight. Music was being played in the background. LRT and peers conversed about different games they have played before, what they do in their free time and anything else that is on their minds. LRT encouraged pts to drink water after being outside, sweating and getting their heart rate up.  Goal Area(s) Addressed: Patient will build on frustration tolerance skills. Patients will partake in a competitive play game with peers. Patients will gain knowledge of new leisure interest/hobby.    Affect/Mood: Appropriate   Participation Level: Active   Participation Quality: Independent   Behavior: Appropriate   Speech/Thought Process: Coherent   Insight: Good   Judgement: Good   Modes of Intervention: Activity   Patient Response to Interventions:  Receptive   Education Outcome:  Acknowledges education   Clinical Observations/Individualized Feedback: Charlene [name] was active in their participation of session activities and group discussion. Pt interacted well with LRT and peers duration of session.    Plan: Continue to engage patient in RT group sessions 2-3x/week.   Jeoffrey BRAVO Celestia, LRT, CTRS 10/26/2023 4:58 PM

## 2023-10-26 NOTE — Plan of Care (Signed)
  Problem: Education: Goal: Knowledge of [facility] Education information/materials will improve 10/26/2023 1724 by Shirley Jon FALCON, RN Outcome: Progressing 10/26/2023 1719 by Shirley Jon FALCON, RN Outcome: Progressing Goal: Emotional status will improve 10/26/2023 1724 by Shirley Jon FALCON, RN Outcome: Progressing 10/26/2023 1719 by Shirley Jon FALCON, RN Outcome: Progressing Goal: Mental status will improve 10/26/2023 1724 by Shirley Jon FALCON, RN Outcome: Progressing 10/26/2023 1719 by Shirley Jon FALCON, RN Outcome: Progressing Goal: Verbalization of understanding the information provided will improve 10/26/2023 1724 by Shirley Jon FALCON, RN Outcome: Progressing 10/26/2023 1719 by Shirley Jon FALCON, RN Outcome: Progressing   Problem: Activity: Goal: Interest or engagement in activities will improve 10/26/2023 1724 by Shirley Jon FALCON, RN Outcome: Progressing 10/26/2023 1719 by Shirley Jon FALCON, RN Outcome: Progressing Goal: Sleeping patterns will improve 10/26/2023 1724 by Shirley Jon FALCON, RN Outcome: Progressing 10/26/2023 1719 by Shirley Jon FALCON, RN Outcome: Progressing   Problem: Coping: Goal: Ability to verbalize frustrations and anger appropriately will improve 10/26/2023 1724 by Shirley Jon FALCON, RN Outcome: Progressing 10/26/2023 1719 by Shirley Jon FALCON, RN Outcome: Progressing Goal: Ability to demonstrate self-control will improve 10/26/2023 1724 by Shirley Jon FALCON, RN Outcome: Progressing 10/26/2023 1719 by Shirley Jon FALCON, RN Outcome: Progressing   Problem: Health Behavior/Discharge Planning: Goal: Identification of resources available to assist in meeting health care needs will improve 10/26/2023 1724 by Shirley Jon FALCON, RN Outcome: Progressing 10/26/2023 1719 by Shirley Jon FALCON, RN Outcome: Progressing Goal: Compliance with treatment plan for underlying cause of condition will improve 10/26/2023 1724 by Shirley Jon FALCON,  RN Outcome: Progressing 10/26/2023 1719 by Shirley Jon FALCON, RN Outcome: Progressing   Problem: Physical Regulation: Goal: Ability to maintain clinical measurements within normal limits will improve 10/26/2023 1724 by Shirley Jon FALCON, RN Outcome: Progressing 10/26/2023 1719 by Shirley Jon FALCON, RN Outcome: Progressing   Problem: Safety: Goal: Periods of time without injury will increase 10/26/2023 1724 by Shirley Jon FALCON, RN Outcome: Progressing 10/26/2023 1719 by Shirley Jon FALCON, RN Outcome: Progressing

## 2023-10-27 DIAGNOSIS — F332 Major depressive disorder, recurrent severe without psychotic features: Secondary | ICD-10-CM | POA: Diagnosis not present

## 2023-10-27 NOTE — Group Note (Signed)
 Recreation Therapy Group Note   Group Topic:Goal Setting  Group Date: 10/27/2023 Start Time: 1000 End Time: 1040 Facilitators: Celestia Jeoffrey BRAVO, LRT, CTRS Location: Craft Room  Group description: Now Future Wall. Patients were given a sheet of paper and asked to fold it into 3 sections, like a pamphlet. Top section, patients were encouraged to write what they are feeling or experiencing "now". The bottom section, patients were asked to fill out how they want to feel or things they want to experience in the "future".  In the middle section, patients were encouraged to fill out any "walls" or barriers that are getting in the way of them reaching their "future". On the back of the sheet, patients were encouraged to write positive coping skills that will help them get over or though the walls they experience. LRT and patients discussed each of the sections and shared them aloud in group. Patients are encouraged to keep this paper with them as a guide/plan post discharge.     Goal Area(s) Addressed:    Patients will identify walls/triggers.   Patients will identify and list coping skills to use post-discharge.   Patients will work on goal setting, short or long-term.   Patients will work on communication by reading aloud to group and engaging in post activity discussion.    Affect/Mood: Appropriate   Participation Level: Active   Participation Quality: Independent   Behavior: Appropriate   Speech/Thought Process: Coherent   Insight: Good   Judgement: Good   Modes of Intervention: Writing   Patient Response to Interventions:  Receptive   Education Outcome:  Acknowledges education   Clinical Observations/Individualized Feedback: Sherlynn was active in their participation of session activities and group discussion.    Plan: Continue to engage patient in RT group sessions 2-3x/week.   Jeoffrey BRAVO Celestia, LRT, CTRS 10/27/2023 10:58 AM

## 2023-10-27 NOTE — Group Note (Signed)
 Date:  10/27/2023 Time:  1:58 PM  Group Topic/Focus:    Chief Of Staff. Patients were given the opportunity to go outside to the courtyard to play basketball, corn-hole, draw with chalk and listen to music while enjoying fresh air and sunlight.    Participation Level:  Active   Charlene [name] 10/27/2023, 1:58 PM

## 2023-10-27 NOTE — Plan of Care (Signed)
  Problem: Education: Goal: Knowledge of [facility] Education information/materials will improve Outcome: Progressing   Problem: Education: Goal: Emotional status will improve Outcome: Progressing   Problem: Education: Goal: Mental status will improve Outcome: Progressing   

## 2023-10-27 NOTE — Group Note (Signed)
 Date:  10/27/2023 Time:  2:32 AM  Group Topic/Focus:  Identifying Needs:   The focus of this group is to help patients identify their personal needs that have been historically problematic and identify healthy behaviors to address their needs. Making Healthy Choices:   The focus of this group is to help patients identify negative/unhealthy choices they were using prior to admission and identify positive/healthier coping strategies to replace them upon discharge. Self Care:   The focus of this group is to help patients understand the importance of self-care in order to improve or restore emotional, physical, spiritual, interpersonal, and financial health. Wrap-Up Group:   The focus of this group is to help patients review their daily goal of treatment and discuss progress on daily workbooks.    Participation Level:  Active  Participation Quality:  Appropriate and Attentive  Affect:  Appropriate  Cognitive:  Alert, Appropriate, and Oriented  Insight: Appropriate and Good  Engagement in Group:  Engaged  Modes of Intervention:  Discussion and Support  Additional Comments:  N/A  Charlene [name] 10/27/2023, 2:32 AM

## 2023-10-27 NOTE — Progress Notes (Signed)
   10/27/23 0900  Charting Type  Charting Type Shift assessment  Safety Check Verification  Has the RN verified the 15 minute safety check completion? Yes  Neurological  Neuro (WDL) WDL  Glasgow Coma Scale  Eye Opening 4  Best Verbal Response (NON-intubated) 5  Best Motor Response 6  Glasgow Coma Scale Score 15  HEENT  HEENT (WDL) WDL  Vision Check No  Teeth Intact  Tongue Pink;Moist  Mucous Membrane(s) Moist;Pink  Voice Clear  Respiratory  Respiratory Pattern Regular  Respiratory (WDL) WDL  Cardiac  Cardiac (WDL) WDL  Pulse Regular  ECG Monitor No  Antiarrhythmic device No  Vascular  Vascular (WDL) WDL  Integumentary  Integumentary (WDL) WDL  Braden Scale (Ages 8 and up)  Sensory Perceptions 4  Moisture 3  Activity 4  Mobility 4  Nutrition 3  Friction and Shear 3  Braden Scale Score 21  Musculoskeletal  Musculoskeletal (WDL) WDL  Assistive Device None  Gastrointestinal  Gastrointestinal (WDL) WDL  GU Assessment  Genitourinary (WDL) WDL  Neurological  Level of Consciousness Alert

## 2023-10-27 NOTE — Progress Notes (Signed)
   10/26/23 2000  Psych Admission Type (Psych Patients Only)  Admission Status Involuntary  Psychosocial Assessment  Patient Complaints None  Eye Contact Brief;Fair  Facial Expression Animated  Affect Appropriate to circumstance  Speech Logical/coherent  Interaction Assertive  Motor Activity Slow  Appearance/Hygiene Unremarkable  Behavior Characteristics Cooperative  Mood Anxious;Depressed  Thought Process  Coherency WDL  Content WDL  Delusions None reported or observed  Perception WDL  Hallucination None reported or observed  Judgment Poor  Confusion None  Danger to Self  Current suicidal ideation? Denies  Agreement Not to Harm Self Yes  Description of Agreement verbal  Danger to Others  Danger to Others None reported or observed   Patient is alert and oriented x 4, affect is flat but brightens at approach, she denies SI/HI/AVH interacting with peers and staff. 15 minutes safety checks maintained.

## 2023-10-27 NOTE — Progress Notes (Signed)
 [facility] MD Progress Note  10/27/2023 4:52 PM Lovena Kluck  MRN:  [id number]   Subjective:  Chart reviewed, case discussed in multidisciplinary meeting, patient seen during rounds.   Upon assessment patient is found in the milieu. Patient is pleasant and cooperative. Reports she feels she is doing better. Patient reports she is sleeping and eating well. She has been active and participating in groups. Denies any concerns/complaints. She denies any side effects from medications.   Patient denies SI, HI, or AVH. Denies any symptoms of alcohol withdrawal.   Sleep: Good  Appetite:  Good  Past Psychiatric History: see h&P Family History:  Family History  Problem Relation Age of Onset   Hypertension Mother    Heart disease Father    Hypertension Father    Social History:  Social History   Substance and Sexual Activity  Alcohol Use Yes   Comment: patient reports drinking 3-4 shots of vodka or 2-3 glasses of wine most days     Social History   Substance and Sexual Activity  Drug Use No    Social History   Socioeconomic History   Marital status: Divorced    Spouse name: Not on file   Number of children: Not on file   Years of education: Not on file   Highest education level: Not on file  Occupational History   Not on file  Tobacco Use   Smoking status: Never   Smokeless tobacco: Never  Vaping Use   Vaping status: Never Used  Substance and Sexual Activity   Alcohol use: Yes    Comment: patient reports drinking 3-4 shots of vodka or 2-3 glasses of wine most days   Drug use: No   Sexual activity: Yes    Birth control/protection: I.U.D.  Other Topics Concern   Not on file  Social History Narrative   Not on file   Social Drivers of Health   Financial Resource Strain: Not on file  Food Insecurity: No Food Insecurity (10/24/2023)   Hunger Vital Sign    Worried About Running Out of Food in the Last Year: Never true    Ran Out of Food in the Last Year: Never true   Transportation Needs: No Transportation Needs (10/24/2023)   PRAPARE - Administrator, Civil Service (Medical): No    Lack of Transportation (Non-Medical): No  Physical Activity: Not on file  Stress: Not on file (02/11/2019)  Social Connections: Unknown (05/13/2021)   [facility]   Social Network    Social Network: Not on file   Past Medical History:  Past Medical History:  Diagnosis Date   Anemia    Hypertension     Past Surgical History:  Procedure Laterality Date   GASTRIC BYPASS  2000   HERNIA REPAIR  2004   ROBOTIC ASSISTED LAPAROSCOPIC HYSTERECTOMY AND SALPINGECTOMY Bilateral 11/17/2019   Procedure: XI ROBOTIC ASSISTED LAPAROSCOPIC HYSTERECTOMY AND BILATERAL SALPINGECTOMY;  Surgeon: Kandyce Sor, MD;  Location: [facility] [place];  Service: Gynecology;  Laterality: Bilateral;   VENTRAL HERNIA REPAIR  2004    Current Medications: Current Facility-Administered Medications  Medication Dose Route Frequency Provider Last Rate Last Admin   acetaminophen  (TYLENOL ) tablet 650 mg  650 mg Oral Q6H PRN Wilkie Majel RAMAN, FNP   650 mg at 10/27/23 1610   alum & mag hydroxide-simeth (MAALOX/MYLANTA) 200-200-20 MG/5ML suspension 30 mL  30 mL Oral Q4H PRN Starkes-Perry, Takia S, FNP       amLODipine (NORVASC) tablet 5 mg  5 mg Oral Daily Starkes-Perry, Takia S, FNP   5 mg at 10/27/23 9161   buPROPion  (WELLBUTRIN  XL) 24 hr tablet 150 mg  150 mg Oral Daily Starkes-Perry, Takia S, FNP   150 mg at 10/27/23 9160   cholecalciferol  (VITAMIN D3) 25 MCG (1000 UNIT) tablet 1,000 Units  1,000 Units Oral Daily Starkes-Perry, Takia S, FNP   1,000 Units at 10/27/23 9160   haloperidol (HALDOL) tablet 5 mg  5 mg Oral TID PRN Wilkie Majel RAMAN, FNP   5 mg at 10/26/23 1005   And   diphenhydrAMINE  (BENADRYL ) capsule 50 mg  50 mg Oral TID PRN Wilkie Majel RAMAN, FNP   50 mg at 10/26/23 1005   haloperidol lactate (HALDOL) injection 5 mg  5 mg Intramuscular TID  PRN Wilkie Majel RAMAN, FNP       And   diphenhydrAMINE  (BENADRYL ) injection 50 mg  50 mg Intramuscular TID PRN Wilkie Majel RAMAN, FNP       And   LORazepam  (ATIVAN ) injection 2 mg  2 mg Intramuscular TID PRN Starkes-Perry, Majel RAMAN, FNP       haloperidol lactate (HALDOL) injection 10 mg  10 mg Intramuscular TID PRN Wilkie Majel RAMAN, FNP       And   diphenhydrAMINE  (BENADRYL ) injection 50 mg  50 mg Intramuscular TID PRN Starkes-Perry, Majel RAMAN, FNP       And   LORazepam  (ATIVAN ) injection 2 mg  2 mg Intramuscular TID PRN Wilkie Majel RAMAN, FNP       lamoTRIgine  (LAMICTAL ) tablet 25 mg  25 mg Oral Daily Hunter, Crystal L, PA-C   25 mg at 10/27/23 9160   levothyroxine  (SYNTHROID ) tablet 75 mcg  75 mcg Oral Q0600 Wilkie Majel RAMAN, FNP   75 mcg at 10/27/23 0652   magnesium hydroxide (MILK OF MAGNESIA) suspension 30 mL  30 mL Oral Daily PRN Starkes-Perry, Takia S, FNP       multivitamin with minerals tablet 1 tablet  1 tablet Oral Daily Starkes-Perry, Takia S, FNP   1 tablet at 10/27/23 0838   thiamine (VITAMIN B1) tablet 100 mg  100 mg Oral Daily Starkes-Perry, Takia S, FNP   100 mg at 10/27/23 9161   Or   thiamine (VITAMIN B1) injection 100 mg  100 mg Intravenous Daily Starkes-Perry, Takia S, FNP       vitamin A & D ointment   Topical PRN Hunter, Crystal L, PA-C   42.5 Application at 10/27/23 1615    Lab Results:  Results for orders placed or performed during the hospital encounter of 10/24/23 (from the past 48 hours)  Basic metabolic panel     Status: Abnormal   Collection Time: 10/26/23  8:46 AM  Result Value Ref Range   Sodium 138 135 - 145 mmol/L   Potassium 3.5 3.5 - 5.1 mmol/L   Chloride 107 98 - 111 mmol/L   CO2 21 (L) 22 - 32 mmol/L   Glucose, Bld 146 (H) 70 - 99 mg/dL    Comment: Glucose reference range applies only to samples taken after fasting for at least 8 hours.   BUN 9 6 - 20 mg/dL   Creatinine, Ser 9.14 0.44 - 1.00 mg/dL   Calcium 8.9 8.9 - 89.6  mg/dL   GFR, Estimated >39 >39 mL/min    Comment: (NOTE) Calculated using the CKD-EPI Creatinine Equation (2021)    Anion gap 10 5 - 15    Comment: Performed at [facility], [address]  [place], [place] [address]    Blood Alcohol level:  Lab Results  Component Value Date   ETH 332 (HH) 10/22/2023   ETH 260 (H) 10/07/2021    Metabolic Disorder Labs: No results found for: HGBA1C, MPG No results found for: PROLACTIN No results found for: CHOL, TRIG, HDL, CHOLHDL, VLDL, LDLCALC  Physical Findings: AIMS:  , ,  ,  ,    CIWA:  CIWA-Ar Total: 0 COWS:      Psychiatric Specialty Exam:  Presentation  General Appearance:  Appropriate for Environment  Eye Contact: Fair  Speech: Clear and Coherent  Speech Volume: Normal    Mood and Affect  Mood: Euthymic  Affect: Appropriate   Thought Process  Thought Processes: Coherent  Orientation:Full (Time, Place and Person)  Thought Content:WDL  Hallucinations:Hallucinations: None  Ideas of Reference:None  Suicidal Thoughts:Suicidal Thoughts: No  Homicidal Thoughts:Homicidal Thoughts: No   Sensorium  Memory: Immediate Fair; Recent Fair; Remote Fair  Judgment: Fair  Insight: Fair   Art Therapist  Concentration: Fair  Attention Span: Fair  Recall: [employer] of Knowledge: Fair  Language: Fair   Psychomotor Activity  Psychomotor Activity: Psychomotor Activity: Normal  Musculoskeletal: Strength & Muscle Tone: within normal limits Gait & Station: normal Assets  Assets: Manufacturing Systems Engineer; Vocational/Educational; Housing; Social Support    Physical Exam: Physical Exam Vitals and nursing note reviewed.  Constitutional:      Appearance: Normal appearance.  Pulmonary:     Effort: Pulmonary effort is normal.  Neurological:     Mental Status: She is alert and oriented to person, place, and time.  Psychiatric:        Mood and Affect: Mood  normal.        Behavior: Behavior normal.        Thought Content: Thought content normal.    Review of Systems  Respiratory:  Negative for shortness of breath.   Cardiovascular:  Negative for chest pain.  Gastrointestinal:  Negative for nausea and vomiting.  Skin:  Negative for rash.  Psychiatric/Behavioral:  Positive for substance abuse. Negative for hallucinations and suicidal ideas.   All other systems reviewed and are negative.  Blood pressure (!) 155/90, pulse 85, temperature 98.1 F (36.7 C), temperature source Oral, resp. rate 18, height 5' 10 (1.778 m), weight 126.6 kg, last menstrual period 09/17/2019, SpO2 100%. Body mass index is 40.03 kg/m.  Diagnosis: Principal Problem:   MDD (major depressive disorder), recurrent severe, without psychosis (HCC)   PLAN: Safety and Monitoring:  -- Involuntary admission to inpatient psychiatric unit for safety, stabilization and treatment  -- Daily contact with patient to assess and evaluate symptoms and progress in treatment  -- Patient's case to be discussed in multi-disciplinary team meeting  -- Observation Level : q15 minute checks  -- Vital signs:  q12 hours  -- Precautions: suicide, elopement, and assault -- Encouraged patient to participate in unit milieu and in scheduled group therapies   2. Psychiatric Treatment:  Scheduled Medications: Wellbutrin  XL 150 mg Lamictal  25 mg once daily CIWA protocol    -- The risks/benefits/side-effects/alternatives to this medication were discussed in detail with the patient and time was given for questions. The patient consents to medication trial.  3. Medical Issues Being Addressed:  Amlodipine for hypertension Synthroid  for hypothyroidism A+D ointment as barrier cream for inframammary skin folds BMP repeated on 10/26/23 at recommendation of internal medicine  4. Discharge Planning:   -- Social work and case management to assist with discharge planning and identification of hospital  follow-up needs prior to discharge  -- Estimated LOS: 5-7 days  Rhyland Hinderliter, NP 10/27/2023, 4:52 PM

## 2023-10-27 NOTE — Group Note (Signed)
 Date:  10/27/2023 Time:  9:17 PM  Group Topic/Focus:  Wrap-Up Group:   The focus of this group is to help patients review their daily goal of treatment and discuss progress on daily workbooks.    Participation Level:  Active  Participation Quality:  Appropriate and Attentive  Affect:  Appropriate  Cognitive:  Alert  Insight: Appropriate and Good  Engagement in Group:  Engaged  Modes of Intervention:  Discussion  Additional Comments:     Maglione,Ronith Berti E 10/27/2023, 9:17 PM

## 2023-10-27 NOTE — Plan of Care (Signed)
   Problem: Education: Goal: Knowledge of [facility] Education information/materials will improve Outcome: Progressing Goal: Emotional status will improve Outcome: Progressing Goal: Mental status will improve Outcome: Progressing Goal: Verbalization of understanding the information provided will improve Outcome: Progressing

## 2023-10-28 DIAGNOSIS — F332 Major depressive disorder, recurrent severe without psychotic features: Secondary | ICD-10-CM | POA: Diagnosis not present

## 2023-10-28 LAB — LIPID PANEL
Cholesterol: 135 mg/dL (ref 0–200)
HDL: 52 mg/dL (ref >40)
LDL Cholesterol: 72 mg/dL (ref 0–99)
Total CHOL/HDL Ratio: 2.6 ratio
Triglycerides: 53 mg/dL (ref <150)
VLDL: 11 mg/dL (ref 0–40)

## 2023-10-28 LAB — HEMOGLOBIN A1C
Hgb A1c MFr Bld: 4.6 % — ABNORMAL LOW (ref 4.8–5.6)
Mean Plasma Glucose: 85.32 mg/dL

## 2023-10-28 MED ORDER — LAMOTRIGINE 25 MG PO TABS
50.0000 mg | ORAL_TABLET | Freq: Every day | ORAL | Status: DC
  Administered 2023-10-29: 50 mg via ORAL
  Filled 2023-10-28 (×2): qty 2

## 2023-10-28 MED ORDER — PNEUMOCOCCAL 20-VAL CONJ VACC 0.5 ML IM SUSY
0.5000 mL | PREFILLED_SYRINGE | INTRAMUSCULAR | Status: DC
  Filled 2023-10-28: qty 0.5

## 2023-10-28 NOTE — Plan of Care (Signed)

## 2023-10-28 NOTE — Progress Notes (Signed)
 [facility] MD Progress Note  10/28/2023 1:47 PM Charlene [name]  MRN:  [id number]   Subjective:  Chart reviewed, case discussed in multidisciplinary meeting, patient seen during rounds.   Upon assessment, patient is found waiting to attend group. Patient reports she is doing good and she slept much better after they raised the head of her bed. Patient reports she is tolerating the lamotrigine  and denies side effects from any of her medications. Discussed increasing medication and patient expressed interest. Reports she is eating and drinking well. Denies any concerns/complaints. She has been actively participating and involved in groups.   Patient denies SI, HI, or AVH. Denies any symptoms of alcohol withdrawal.   10/26: Upon assessment patient is found in the milieu. Patient is pleasant and cooperative. Reports she feels she is doing better. Patient reports she is sleeping and eating well. She has been active and participating in groups. Denies any concerns/complaints. She denies any side effects from medications.   Patient denies SI, HI, or AVH. Denies any symptoms of alcohol withdrawal.   Sleep: Good  Appetite:  Good  Past Psychiatric History: see h&P Family History:  Family History  Problem Relation Age of Onset   Hypertension Mother    Heart disease Father    Hypertension Father    Social History:  Social History   Substance and Sexual Activity  Alcohol Use Yes   Comment: patient reports drinking 3-4 shots of vodka or 2-3 glasses of wine most days     Social History   Substance and Sexual Activity  Drug Use No    Social History   Socioeconomic History   Marital status: Divorced    Spouse name: Not on file   Number of children: Not on file   Years of education: Not on file   Highest education level: Not on file  Occupational History   Not on file  Tobacco Use   Smoking status: Never   Smokeless tobacco: Never  Vaping Use   Vaping status: Never Used   Substance and Sexual Activity   Alcohol use: Yes    Comment: patient reports drinking 3-4 shots of vodka or 2-3 glasses of wine most days   Drug use: No   Sexual activity: Yes    Birth control/protection: I.U.D.  Other Topics Concern   Not on file  Social History Narrative   Not on file   Social Drivers of Health   Financial Resource Strain: Not on file  Food Insecurity: No Food Insecurity (10/24/2023)   Hunger Vital Sign    Worried About Running Out of Food in the Last Year: Never true    Ran Out of Food in the Last Year: Never true  Transportation Needs: No Transportation Needs (10/24/2023)   PRAPARE - Administrator, Civil Service (Medical): No    Lack of Transportation (Non-Medical): No  Physical Activity: Not on file  Stress: Not on file (02/11/2019)  Social Connections: Unknown (05/13/2021)   Received from [facility]   Social Network    Social Network: Not on file   Past Medical History:  Past Medical History:  Diagnosis Date   Anemia    Hypertension     Past Surgical History:  Procedure Laterality Date   GASTRIC BYPASS  2000   HERNIA REPAIR  2004   ROBOTIC ASSISTED LAPAROSCOPIC HYSTERECTOMY AND SALPINGECTOMY Bilateral 11/17/2019   Procedure: XI ROBOTIC ASSISTED LAPAROSCOPIC HYSTERECTOMY AND BILATERAL SALPINGECTOMY;  Surgeon: Kandyce Sor, MD;  Location: [facility];  Service: Gynecology;  Laterality: Bilateral;   VENTRAL HERNIA REPAIR  2004    Current Medications: Current Facility-Administered Medications  Medication Dose Route Frequency Provider Last Rate Last Admin   acetaminophen  (TYLENOL ) tablet 650 mg  650 mg Oral Q6H PRN Wilkie Majel RAMAN, FNP   650 mg at 10/28/23 1339   alum & mag hydroxide-simeth (MAALOX/MYLANTA) 200-200-20 MG/5ML suspension 30 mL  30 mL Oral Q4H PRN Starkes-Perry, Takia S, FNP       amLODipine (NORVASC) tablet 5 mg  5 mg Oral Daily Starkes-Perry, Takia S, FNP   5 mg at 10/28/23 0847   buPROPion   (WELLBUTRIN  XL) 24 hr tablet 150 mg  150 mg Oral Daily Starkes-Perry, Takia S, FNP   150 mg at 10/28/23 0846   cholecalciferol  (VITAMIN D3) 25 MCG (1000 UNIT) tablet 1,000 Units  1,000 Units Oral Daily Wilkie Majel RAMAN, FNP   1,000 Units at 10/28/23 0846   haloperidol (HALDOL) tablet 5 mg  5 mg Oral TID PRN Starkes-Perry, Takia S, FNP   5 mg at 10/26/23 1005   And   diphenhydrAMINE  (BENADRYL ) capsule 50 mg  50 mg Oral TID PRN Starkes-Perry, Takia S, FNP   50 mg at 10/26/23 1005   haloperidol lactate (HALDOL) injection 5 mg  5 mg Intramuscular TID PRN Wilkie Majel RAMAN, FNP       And   diphenhydrAMINE  (BENADRYL ) injection 50 mg  50 mg Intramuscular TID PRN Starkes-Perry, Majel RAMAN, FNP       And   LORazepam  (ATIVAN ) injection 2 mg  2 mg Intramuscular TID PRN Starkes-Perry, Majel RAMAN, FNP       haloperidol lactate (HALDOL) injection 10 mg  10 mg Intramuscular TID PRN Starkes-Perry, Majel RAMAN, FNP       And   diphenhydrAMINE  (BENADRYL ) injection 50 mg  50 mg Intramuscular TID PRN Starkes-Perry, Majel RAMAN, FNP       And   LORazepam  (ATIVAN ) injection 2 mg  2 mg Intramuscular TID PRN Starkes-Perry, Takia S, FNP       lamoTRIgine  (LAMICTAL ) tablet 25 mg  25 mg Oral Daily Hunter, Crystal L, PA-C   25 mg at 10/28/23 0846   levothyroxine  (SYNTHROID ) tablet 75 mcg  75 mcg Oral Q0600 Starkes-Perry, Takia S, FNP   75 mcg at 10/28/23 0630   magnesium hydroxide (MILK OF MAGNESIA) suspension 30 mL  30 mL Oral Daily PRN Starkes-Perry, Takia S, FNP       multivitamin with minerals tablet 1 tablet  1 tablet Oral Daily Starkes-Perry, Takia S, FNP   1 tablet at 10/28/23 0846   [START ON 10/29/2023] pneumococcal 20-valent conjugate vaccine (PREVNAR 20) injection 0.5 mL  0.5 mL Intramuscular Tomorrow-1000 Zadyn Yardley, NP       thiamine (VITAMIN B1) tablet 100 mg  100 mg Oral Daily Starkes-Perry, Takia S, FNP   100 mg at 10/28/23 9153   Or   thiamine (VITAMIN B1) injection 100 mg  100 mg Intravenous Daily  Starkes-Perry, Takia S, FNP       vitamin A & D ointment   Topical PRN Hunter, Crystal L, PA-C   42.5 Application at 10/28/23 9146    Lab Results:  Results for orders placed or performed during the hospital encounter of 10/24/23 (from the past 48 hours)  Lipid panel     Status: None   Collection Time: 10/28/23  8:15 AM  Result Value Ref Range   Cholesterol 135 0 - 200 mg/dL   Triglycerides 53 <849 mg/dL  HDL 52 >40 mg/dL   Total CHOL/HDL Ratio 2.6 RATIO   VLDL 11 0 - 40 mg/dL   LDL Cholesterol 72 0 - 99 mg/dL    Comment:        Total Cholesterol/HDL:CHD Risk Coronary Heart Disease Risk Table                     Men   Women  1/2 Average Risk   3.4   3.3  Average Risk       5.0   4.4  2 X Average Risk   9.6   7.1  3 X Average Risk  23.4   11.0        Use the calculated Patient Ratio above and the CHD Risk Table to determine the patient's CHD Risk.        ATP III CLASSIFICATION (LDL):  <100     mg/dL   Optimal  899-870  mg/dL   Near or Above                    Optimal  130-159  mg/dL   Borderline  839-810  mg/dL   High  >809     mg/dL   Very High Performed at [facility], [address] [place], [place] [address]     Blood Alcohol level:  Lab Results  Component Value Date   ETH 332 [facility]) 10/22/2023   ETH 260 (H) 10/07/2021    Metabolic Disorder Labs: No results found for: HGBA1C, MPG No results found for: PROLACTIN Lab Results  Component Value Date   CHOL 135 10/28/2023   TRIG 53 10/28/2023   HDL 52 10/28/2023   CHOLHDL 2.6 10/28/2023   VLDL 11 10/28/2023   LDLCALC 72 10/28/2023    Physical Findings: AIMS:  , ,  ,  ,    CIWA:  CIWA-Ar Total: 0 COWS:      Psychiatric Specialty Exam:  Presentation  General Appearance:  Appropriate for Environment; Fairly Groomed  Eye Contact: Good  Speech: Clear and Coherent; Normal Rate  Speech Volume: Normal    Mood and Affect  Mood: Euthymic  (good)  Affect: Appropriate   Thought Process  Thought Processes: Coherent; Linear  Orientation:Full (Time, Place and Person)  Thought Content:WDL  Hallucinations:Hallucinations: None  Ideas of Reference:None  Suicidal Thoughts:Suicidal Thoughts: No  Homicidal Thoughts:Homicidal Thoughts: No   Sensorium  Memory: Immediate Fair; Recent Fair; Remote Fair  Judgment: Fair  Insight: Fair   Art Therapist  Concentration: Fair  Attention Span: Fair  Recall: [employer] of Knowledge: Fair  Language: Fair   Psychomotor Activity  Psychomotor Activity: Psychomotor Activity: Normal  Musculoskeletal: Strength & Muscle Tone: within normal limits Gait & Station: normal Assets  Assets: Manufacturing Systems Engineer; Vocational/Educational; Housing; Social Support    Physical Exam: Physical Exam Vitals and nursing note reviewed.  Constitutional:      Appearance: Normal appearance.  Pulmonary:     Effort: Pulmonary effort is normal.  Neurological:     Mental Status: She is alert and oriented to person, place, and time.  Psychiatric:        Mood and Affect: Mood normal.        Behavior: Behavior normal.        Thought Content: Thought content normal.    Review of Systems  Respiratory:  Negative for shortness of breath.   Cardiovascular:  Negative for chest pain.  Gastrointestinal:  Negative for nausea and vomiting.  Skin:  Negative for rash.  Psychiatric/Behavioral:  Positive for substance abuse. Negative for hallucinations and suicidal ideas.   All other systems reviewed and are negative.  Blood pressure 130/75, pulse 95, temperature 98.6 F (37 C), temperature source Oral, resp. rate 20, height 5' 10 (1.778 m), weight 126.6 kg, last menstrual period 09/17/2019, SpO2 99%. Body mass index is 40.03 kg/m.  Diagnosis: Principal Problem:   MDD (major depressive disorder), recurrent severe, without psychosis (HCC)   PLAN: Safety and  Monitoring:  -- Involuntary admission to inpatient psychiatric unit for safety, stabilization and treatment  -- Daily contact with patient to assess and evaluate symptoms and progress in treatment  -- Patient's case to be discussed in multi-disciplinary team meeting  -- Observation Level : q15 minute checks  -- Vital signs:  q12 hours  -- Precautions: suicide, elopement, and assault -- Encouraged patient to participate in unit milieu and in scheduled group therapies   2. Psychiatric Treatment:  Scheduled Medications: Wellbutrin  XL 150 mg Increase lamotrigine  to 50 mg starting tomorrow AM CIWA protocol    -- The risks/benefits/side-effects/alternatives to this medication were discussed in detail with the patient and time was given for questions. The patient consents to medication trial.  3. Medical Issues Being Addressed:  Amlodipine for hypertension Synthroid  for hypothyroidism A+D ointment as barrier cream for inframammary skin folds BMP repeated on 10/26/23 at recommendation of internal medicine  4. Discharge Planning:   -- Social work and case management to assist with discharge planning and identification of hospital follow-up needs prior to discharge  -- Estimated LOS: 5-7 days  Josefita Weissmann, NP 10/28/2023, 1:47 PM

## 2023-10-28 NOTE — Group Note (Signed)
 LCSW Group Therapy Note  Group Date: 10/28/2023 Start Time: 1300 End Time: 1400   Type of Therapy and Topic:  Group Therapy: Positive Affirmations  Participation Level:  Active   Description of Group:   This group addressed positive affirmation towards self and others.  Patients went around the room and identified two positive things about themselves and two positive things about a peer in the room.  Patients reflected on how it felt to share something positive with others, to identify positive things about themselves, and to hear positive things from others/ Patients were encouraged to have a daily reflection of positive characteristics or circumstances.   Therapeutic Goals: Patients will verbalize two of their positive qualities Patients will demonstrate empathy for others by stating two positive qualities about a peer in the group Patients will verbalize their feelings when voicing positive self affirmations and when voicing positive affirmations of others Patients will discuss the potential positive impact on their wellness/recovery of focusing on positive traits of self and others.  Summary of Patient Progress: Patient actively engaged in the discussion and . Patient was able to identify positive affirmations about themself as well as other group members. Patient demonstrated proficient insight into the subject matter, was respectful of peers, participated throughout the entire session.  Therapeutic Modalities:   Cognitive Behavioral Therapy Motivational Interviewing    Alveta CHRISTELLA Kerns, [place] 10/28/2023  2:57 PM

## 2023-10-28 NOTE — Group Note (Signed)
 Date:  10/28/2023 Time:  9:56 PM  Group Topic/Focus:  Making Healthy Choices:   The focus of this group is to help patients identify negative/unhealthy choices they were using prior to admission and identify positive/healthier coping strategies to replace them upon discharge. Wrap-Up Group:   The focus of this group is to help patients review their daily goal of treatment and discuss progress on daily workbooks.    Participation Level:  Active  Participation Quality:  Appropriate and Attentive  Affect:  Appropriate  Cognitive:  Alert, Appropriate, and Oriented  Insight: Appropriate and Good  Engagement in Group:  Engaged  Modes of Intervention:  Discussion and Support  Additional Comments:  N/A  Charlene [name] 10/28/2023, 9:56 PM

## 2023-10-28 NOTE — Progress Notes (Signed)
   10/28/23 0900  Psych Admission Type (Psych Patients Only)  Admission Status Involuntary  Psychosocial Assessment  Patient Complaints None  Eye Contact Fair  Facial Expression Animated  Affect Appropriate to circumstance  Speech Logical/coherent  Interaction Assertive  Motor Activity Slow  Appearance/Hygiene Unremarkable  Behavior Characteristics Cooperative  Mood Depressed  Thought Process  Coherency WDL  Content WDL  Delusions None reported or observed  Perception WDL  Hallucination None reported or observed  Judgment Poor  Confusion None  Danger to Self  Current suicidal ideation? Denies  Danger to Others  Danger to Others None reported or observed

## 2023-10-28 NOTE — Group Note (Signed)
 Date:  10/28/2023 Time:  4:57 PM  Group Topic/Focus:  Wellness Toolbox:   The focus of this group is to discuss various aspects of wellness, balancing those aspects and exploring ways to increase the ability to experience wellness.  Patients will create a wellness toolbox for use upon discharge.    Participation Level:  Did Not Attend   Deitra Clap [facility] 10/28/2023, 4:57 PM

## 2023-10-28 NOTE — Group Note (Signed)
 Date:  10/28/2023 Time:  10:35 AM  Group Topic/Focus:  Relapse Prevention Planning:   The focus of this group is to define relapse and discuss the need for planning to combat relapse.    Participation Level:  Active  Participation Quality:  Appropriate  Affect:  Appropriate  Cognitive:  Appropriate  Insight: Appropriate  Engagement in Group:  Engaged  Modes of Intervention:  Activity  Additional Comments:    Camellia HERO Aydien Majette 10/28/2023, 10:35 AM

## 2023-10-29 MED ORDER — INFLUENZA VIRUS VACC SPLIT PF (FLUZONE) 0.5 ML IM SUSY
0.5000 mL | PREFILLED_SYRINGE | INTRAMUSCULAR | Status: AC
  Administered 2023-10-30: 0.5 mL via INTRAMUSCULAR
  Filled 2023-10-29 (×2): qty 0.5

## 2023-10-29 NOTE — Progress Notes (Signed)
   10/29/23 2045  Psych Admission Type (Psych Patients Only)  Admission Status Involuntary  Psychosocial Assessment  Patient Complaints None  Eye Contact Fair  Facial Expression Other (Comment) (fair)  Affect Appropriate to circumstance  Speech Logical/coherent  Interaction Other (Comment) (WNL)  Motor Activity Slow  Appearance/Hygiene Unremarkable  Behavior Characteristics Appropriate to situation  Mood Pleasant  Thought Process  Coherency WDL  Content WDL  Delusions None reported or observed  Perception WDL  Hallucination None reported or observed  Judgment Impaired  Confusion None  Danger to Self  Current suicidal ideation? Denies  Agreement Not to Harm Self Yes  Description of Agreement verbal  Danger to Others  Danger to Others None reported or observed

## 2023-10-29 NOTE — Plan of Care (Signed)
  Problem: Education: Goal: Knowledge of [facility] Education information/materials will improve Outcome: Progressing   Problem: Education: Goal: Emotional status will improve Outcome: Progressing   

## 2023-10-29 NOTE — Progress Notes (Signed)
   10/28/23 2000  Psych Admission Type (Psych Patients Only)  Admission Status Involuntary  Psychosocial Assessment  Patient Complaints None  Eye Contact Fair  Facial Expression Other (Comment) (appropriate)  Affect Appropriate to circumstance  Speech Logical/coherent  Interaction Assertive  Motor Activity Slow  Appearance/Hygiene Improved  Behavior Characteristics Cooperative;Appropriate to situation  Mood Pleasant  Thought Process  Coherency WDL  Content WDL  Delusions None reported or observed  Perception WDL  Hallucination None reported or observed  Judgment Poor  Confusion None  Danger to Self  Current suicidal ideation? Denies  Danger to Others  Danger to Others None reported or observed   15 minutes safety checks maintained, 15 minutes safety checks maintained.

## 2023-10-29 NOTE — Group Note (Signed)
 Date:  10/29/2023 Time:  8:50 PM  Group Topic/Focus:  Wrap-Up Group:   The focus of this group is to help patients review their daily goal of treatment and discuss progress on daily workbooks.    Participation Level:  Active  Participation Quality:  Appropriate and Attentive  Affect:  Appropriate  Cognitive:  Alert and Appropriate  Insight: Appropriate and Good  Engagement in Group:  Engaged  Modes of Intervention:  Activity  Additional Comments:     Arlester CHRISTELLA Servant 10/29/2023, 8:50 PM

## 2023-10-29 NOTE — Group Note (Signed)
 Date:  10/29/2023 Time:  4:06 PM  Group Topic/Focus:  Overcoming Stress:   The focus of this group is to define stress and help patients assess their triggers.    Participation Level:  Active  Participation Quality:  Appropriate  Affect:  Appropriate  Cognitive:  Appropriate  Insight: Appropriate  Engagement in Group:  Engaged  Modes of Intervention:  Activity  Additional Comments:    Camellia HERO Nonie Lochner 10/29/2023, 4:06 PM

## 2023-10-29 NOTE — Group Note (Signed)
 Date:  10/29/2023 Time:  10:39 AM  Group Topic/Focus:  Healthy Communication:   The focus of this group is to discuss communication, barriers to communication, as well as healthy ways to communicate with others.    Participation Level:  Active  Participation Quality:  Appropriate  Affect:  Appropriate  Cognitive:  Appropriate  Insight: Appropriate  Engagement in Group:  Engaged  Modes of Intervention:  Activity  Additional Comments:    Camellia HERO Paxtyn Boyar 10/29/2023, 10:39 AM

## 2023-10-29 NOTE — Progress Notes (Signed)
   10/29/23 0900  Psych Admission Type (Psych Patients Only)  Admission Status Involuntary  Psychosocial Assessment  Patient Complaints None  Eye Contact Fair  Facial Expression Other (Comment) (appropriate)  Affect Appropriate to circumstance  Speech Logical/coherent  Interaction Other (Comment) (WNL)  Motor Activity Slow  Appearance/Hygiene Unremarkable  Behavior Characteristics Appropriate to situation  Mood Euthymic;Pleasant  Thought Process  Coherency WDL  Content WDL  Delusions None reported or observed  Perception WDL  Hallucination None reported or observed  Judgment Impaired  Confusion None  Danger to Self  Current suicidal ideation? Denies  Danger to Others  Danger to Others None reported or observed

## 2023-10-29 NOTE — Progress Notes (Addendum)
   10/29/23 1627  Vital Signs  BP (!) 152/91   Notified Zelda sharps of evaluated BP. No new orders.

## 2023-10-29 NOTE — Progress Notes (Signed)
 [facility] MD Progress Note  10/29/2023 12:59 PM Charlene [name]  MRN:  [id number]   Subjective:  Chart reviewed, case discussed in multidisciplinary meeting, patient seen during rounds.   Upon assessment, patient is found waiting to attend group. Patient reports she is doing good and she slept much better after they raised the head of her bed. Patient reports she is tolerating the lamotrigine  and denies side effects from any of her medications. Discussed increasing medication and patient expressed interest. Reports she is eating and drinking well. Denies any concerns/complaints. She has been actively participating and involved in groups.   Patient denies SI, HI, or AVH. Denies any symptoms of alcohol withdrawal.   10/26: Upon assessment patient is found in the milieu. Patient is pleasant and cooperative. Reports she feels she is doing better. Patient reports she is sleeping and eating well. She has been active and participating in groups. Denies any concerns/complaints. She denies any side effects from medications.   Patient denies SI, HI, or AVH. Denies any symptoms of alcohol withdrawal.    10/29/2023: Patient was seen today on rounds.  Patient was noted to be watching TV in the day room when provider initially was speaking with patient.  Patient preferred to have conversation in her room.  Patient appears to be interacting with her peers appropriately on the unit.  Patient reported doing well with current medication regimen.  She denied any current known side effects to current medication regimen.  Lamotrigine  was increased to 50 mg today, patient reported tolerating medication well so far, she denied any known rashes or other side effects.  Patient reported sleeping well, about 7 hours a night.  She reported eating well as well.  She denied any suicidal or homicidal ideations.  She denied any auditory or visual hallucinations.  She did report she will be returning back to the house with her  mother and daughter.  She did give consent to call her mother, as she reports she feels as though she is ready to be discharged.  She denied any current alcohol withdrawal symptoms.  She denied any access to any weapons in the home.  She did report desire to speak with social work team about ensuring appointments for intensive outpatient programs.  This was relayed to social work team by this provider. Sleep: Good  Appetite:  Good  Past Psychiatric History: see h&P Family History:  Family History  Problem Relation Age of Onset   Hypertension Mother    Heart disease Father    Hypertension Father    Social History:  Social History   Substance and Sexual Activity  Alcohol Use Yes   Comment: patient reports drinking 3-4 shots of vodka or 2-3 glasses of wine most days     Social History   Substance and Sexual Activity  Drug Use No    Social History   Socioeconomic History   Marital status: Divorced    Spouse name: Not on file   Number of children: Not on file   Years of education: Not on file   Highest education level: Not on file  Occupational History   Not on file  Tobacco Use   Smoking status: Never   Smokeless tobacco: Never  Vaping Use   Vaping status: Never Used  Substance and Sexual Activity   Alcohol use: Yes    Comment: patient reports drinking 3-4 shots of vodka or 2-3 glasses of wine most days   Drug use: No   Sexual activity: Yes  Birth control/protection: I.U.D.  Other Topics Concern   Not on file  Social History Narrative   Not on file   Social Drivers of Health   Financial Resource Strain: Not on file  Food Insecurity: No Food Insecurity (10/24/2023)   Hunger Vital Sign    Worried About Running Out of Food in the Last Year: Never true    Ran Out of Food in the Last Year: Never true  Transportation Needs: No Transportation Needs (10/24/2023)   PRAPARE - Administrator, Civil Service (Medical): No    Lack of Transportation  (Non-Medical): No  Physical Activity: Not on file  Stress: Not on file (02/11/2019)  Social Connections: Unknown (05/13/2021)   [facility]   Social Network    Social Network: Not on file   Past Medical History:  Past Medical History:  Diagnosis Date   Anemia    Hypertension     Past Surgical History:  Procedure Laterality Date   GASTRIC BYPASS  2000   HERNIA REPAIR  2004   ROBOTIC ASSISTED LAPAROSCOPIC HYSTERECTOMY AND SALPINGECTOMY Bilateral 11/17/2019   Procedure: XI ROBOTIC ASSISTED LAPAROSCOPIC HYSTERECTOMY AND BILATERAL SALPINGECTOMY;  Surgeon: Charlene Sor, MD;  Location: [name] ;  Service: Gynecology;  Laterality: Bilateral;   VENTRAL HERNIA REPAIR  2004    Current Medications: Current Facility-Administered Medications  Medication Dose Route Frequency Provider Last Rate Last Admin   acetaminophen  (TYLENOL ) tablet 650 mg  650 mg Oral Q6H PRN Charlene Majel RAMAN, FNP   650 mg at 10/28/23 1339   alum & mag hydroxide-simeth (MAALOX/MYLANTA) 200-200-20 MG/5ML suspension 30 mL  30 mL Oral Q4H PRN Starkes-Perry, Majel RAMAN, FNP       amLODipine (NORVASC) tablet 5 mg  5 mg Oral Daily Starkes-Perry, Takia S, FNP   5 mg at 10/29/23 9176   buPROPion  (WELLBUTRIN  XL) 24 hr tablet 150 mg  150 mg Oral Daily Starkes-Perry, Takia S, FNP   150 mg at 10/29/23 9176   cholecalciferol  (VITAMIN D3) 25 MCG (1000 UNIT) tablet 1,000 Units  1,000 Units Oral Daily Charlene Majel RAMAN, FNP   1,000 Units at 10/29/23 9176   haloperidol (HALDOL) tablet 5 mg  5 mg Oral TID PRN Starkes-Perry, Takia S, FNP   5 mg at 10/26/23 1005   And   diphenhydrAMINE  (BENADRYL ) capsule 50 mg  50 mg Oral TID PRN Starkes-Perry, Takia S, FNP   50 mg at 10/26/23 1005   haloperidol lactate (HALDOL) injection 5 mg  5 mg Intramuscular TID PRN Charlene Majel RAMAN, FNP       And   diphenhydrAMINE  (BENADRYL ) injection 50 mg  50 mg Intramuscular TID PRN Starkes-Perry, Majel RAMAN, FNP       And    LORazepam  (ATIVAN ) injection 2 mg  2 mg Intramuscular TID PRN Starkes-Perry, Majel RAMAN, FNP       haloperidol lactate (HALDOL) injection 10 mg  10 mg Intramuscular TID PRN Charlene Majel RAMAN, FNP       And   diphenhydrAMINE  (BENADRYL ) injection 50 mg  50 mg Intramuscular TID PRN Starkes-Perry, Majel RAMAN, FNP       And   LORazepam  (ATIVAN ) injection 2 mg  2 mg Intramuscular TID PRN Starkes-Perry, Majel RAMAN, FNP       [START ON 10/30/2023] influenza vac split trivalent PF (FLUZONE) injection 0.5 mL  0.5 mL Intramuscular Tomorrow-1000 Donnelly Mellow, MD       lamoTRIgine  (LAMICTAL ) tablet 50 mg  50 mg Oral  Daily May, Tanya, NP   50 mg at 10/29/23 9176   levothyroxine  (SYNTHROID ) tablet 75 mcg  75 mcg Oral Q0600 Charlene Majel RAMAN, FNP   75 mcg at 10/29/23 0600   magnesium hydroxide (MILK OF MAGNESIA) suspension 30 mL  30 mL Oral Daily PRN Starkes-Perry, Takia S, FNP       multivitamin with minerals tablet 1 tablet  1 tablet Oral Daily Starkes-Perry, Takia S, FNP   1 tablet at 10/29/23 9176   pneumococcal 20-valent conjugate vaccine (PREVNAR 20) injection 0.5 mL  0.5 mL Intramuscular Tomorrow-1000 May, Tanya, NP       thiamine (VITAMIN B1) tablet 100 mg  100 mg Oral Daily Charlene Majel RAMAN, FNP   100 mg at 10/29/23 9177   Or   thiamine (VITAMIN B1) injection 100 mg  100 mg Intravenous Daily Starkes-Perry, Takia S, FNP       vitamin A & D ointment   Topical PRN Hunter, Crystal L, PA-C   42.5 Application at 10/29/23 0825    Lab Results:  Results for orders placed or performed during the hospital encounter of 10/24/23 (from the past 48 hours)  Lipid panel     Status: None   Collection Time: 10/28/23  8:15 AM  Result Value Ref Range   Cholesterol 135 0 - 200 mg/dL   Triglycerides 53 <849 mg/dL   HDL 52 >59 mg/dL   Total CHOL/HDL Ratio 2.6 RATIO   VLDL 11 0 - 40 mg/dL   LDL Cholesterol 72 0 - 99 mg/dL    Comment:        Total Cholesterol/HDL:CHD Risk Coronary Heart Disease Risk  Table                     Men   Women  1/2 Average Risk   3.4   3.3  Average Risk       5.0   4.4  2 X Average Risk   9.6   7.1  3 X Average Risk  23.4   11.0        Use the calculated Patient Ratio above and the CHD Risk Table to determine the patient'[name] CHD Risk.        ATP III CLASSIFICATION (LDL):  <100     mg/dL   Optimal  899-870  mg/dL   Near or Above                    Optimal  130-159  mg/dL   Borderline  839-810  mg/dL   High  >809     mg/dL   Very High Performed at [facility], [address] [place], [place] [address]   Hemoglobin A1c     Status: Abnormal   Collection Time: 10/28/23  8:15 AM  Result Value Ref Range   Hgb A1c MFr Bld 4.6 (L) 4.8 - 5.6 %    Comment: (NOTE) Diagnosis of Diabetes The following HbA1c ranges recommended by the American Diabetes Association (ADA) may be used as an aid in the diagnosis of diabetes mellitus.  Hemoglobin             Suggested A1C NGSP%              Diagnosis  <5.7                   Non Diabetic  5.7-6.4                Pre-Diabetic  >  6.4                   Diabetic  <7.0                   Glycemic control for                       adults with diabetes.     Mean Plasma Glucose 85.32 mg/dL    Comment: Performed at [facility], [address] [place], [place] [address]    Blood Alcohol level:  Lab Results  Component Value Date   ETH 332 [facility]) 10/22/2023   ETH 260 (H) 10/07/2021    Metabolic Disorder Labs: Lab Results  Component Value Date   HGBA1C 4.6 (L) 10/28/2023   MPG 85.32 10/28/2023   No results found for: PROLACTIN Lab Results  Component Value Date   CHOL 135 10/28/2023   TRIG 53 10/28/2023   HDL 52 10/28/2023   CHOLHDL 2.6 10/28/2023   VLDL 11 10/28/2023   LDLCALC 72 10/28/2023    Physical Findings: AIMS:  , ,  ,  ,    CIWA:  CIWA-Ar Total: 0 COWS:      Psychiatric Specialty Exam:  Presentation  General Appearance:  Appropriate for Environment; Fairly  Groomed  Eye Contact: Good  Speech: Clear and Coherent; Normal Rate  Speech Volume: Normal    Mood and Affect  Mood: Euthymic (good)  Affect: Appropriate   Thought Process  Thought Processes: Coherent; Linear  Orientation:Full (Time, Place and Person)  Thought Content:WDL  Hallucinations:Hallucinations: None  Ideas of Reference:None  Suicidal Thoughts:Suicidal Thoughts: No  Homicidal Thoughts:Homicidal Thoughts: No   Sensorium  Memory: Immediate Fair; Recent Fair; Remote Fair  Judgment: Fair  Insight: Fair   Art Therapist  Concentration: Fair  Attention Span: Fair  Recall: [employer] of Knowledge: Fair  Language: Fair   Psychomotor Activity  Psychomotor Activity: Psychomotor Activity: Normal  Musculoskeletal: Strength & Muscle Tone: within normal limits Gait & Station: normal Assets  Assets: Manufacturing Systems Engineer; Vocational/Educational; Housing; Social Support    Physical Exam: Physical Exam Vitals and nursing note reviewed.  Constitutional:      Appearance: Normal appearance.  Pulmonary:     Effort: Pulmonary effort is normal.  Neurological:     Mental Status: She is alert and oriented to person, place, and time.    Review of Systems  Respiratory:  Negative for shortness of breath.   Cardiovascular:  Negative for chest pain.  Gastrointestinal:  Negative for nausea and vomiting.  Skin:  Negative for rash.  Psychiatric/Behavioral:  Positive for substance abuse. Negative for hallucinations and suicidal ideas.   All other systems reviewed and are negative.  Blood pressure (!) 141/79, pulse 92, temperature 98.6 F (37 C), temperature source Oral, resp. rate 16, height 5' 10 (1.778 m), weight 126.6 kg, last menstrual period 09/17/2019, SpO2 98%. Body mass index is 40.03 kg/m.  Diagnosis: Principal Problem:   MDD (major depressive disorder), recurrent severe, without psychosis (HCC)   PLAN: Safety and  Monitoring:  -- Involuntary admission to inpatient psychiatric unit for safety, stabilization and treatment  -- Daily contact with patient to assess and evaluate symptoms and progress in treatment  -- Patient'[name] case to be discussed in multi-disciplinary team meeting  -- Observation Level : q15 minute checks  -- Vital signs:  q12 hours  -- Precautions: suicide, elopement, and assault -- Encouraged patient to participate in unit milieu and  in scheduled group therapies   2. Psychiatric Treatment:  Scheduled Medications: Wellbutrin  XL 150 mg Continue lamotrigine  50 mg  CIWA protocol    -- The risks/benefits/side-effects/alternatives to this medication were discussed in detail with the patient and time was given for questions. The patient consents to medication trial.  3. Medical Issues Being Addressed:  Amlodipine for hypertension Synthroid  for hypothyroidism A+D ointment as barrier cream for inframammary skin folds BMP repeated on 10/26/23 at recommendation of internal medicine  4. Discharge Planning:   -- Social work and case management to assist with discharge planning and identification of hospital follow-up needs prior to discharge  -- Estimated LOS: 5-7 days  Charlene Sharps, NP

## 2023-10-29 NOTE — Group Note (Signed)
 Recreation Therapy Group Note   Group Topic:Healthy Support Systems  Group Date: 10/29/2023 Start Time: 1030 End Time: 1130 Facilitators: Celestia Jeoffrey BRAVO, LRT, CTRS Location: Craft Room  Group Description: Straw Bridge. In groups or individually, patients were given 10 plastic drinking straws and an equal length of masking tape. Using the materials provided, patients were instructed to build a free-standing bridge-like structure to suspend an everyday item (ex: deck of cards) off the floor or table surface. All materials were required to be used in secondary school teacher. LRT facilitated post-activity discussion reviewing the importance of having strong and healthy support systems in our lives. LRT discussed how the people in our lives serve as the tape and the deck of cards we placed on top of our straw structure are the stressors we face in daily life. LRT and pts discussed what happens in our life when things get too heavy for us , and we don't have strong supports outside of the hospital. Pt shared 2 of their healthy supports in their life aloud in the group.   Goal Area(s) Addressed:  Patient will identify 2 healthy supports in their life. Patient will identify skills to successfully complete activity. Patient will identify correlation of this activity to life post-discharge.  Patient will build on frustration tolerance skills. Patient will increase team building and communication skills.    Affect/Mood: Appropriate   Participation Level: Active and Engaged   Participation Quality: Independent   Behavior: Calm and Cooperative   Speech/Thought Process: Coherent   Insight: Good   Judgement: Good   Modes of Intervention: STEM Activity   Patient Response to Interventions:  Attentive, Engaged, Interested , and Receptive   Education Outcome:  Acknowledges education   Clinical Observations/Individualized Feedback: Charlene [name] was active in their participation of session activities and  group discussion. Pt identified family and prayer as healthy supports.    Plan: Continue to engage patient in RT group sessions 2-3x/week.   Jeoffrey BRAVO Celestia, LRT, CTRS 10/29/2023 1:19 PM

## 2023-10-29 NOTE — Plan of Care (Signed)
   Problem: Education: Goal: Emotional status will improve Outcome: Progressing

## 2023-10-29 NOTE — Group Note (Signed)
 [facility] LCSW Group Therapy Note   Group Date: 10/29/2023 Start Time: 1245 End Time: 1352   Type of Therapy/Topic:  Group Therapy:  Balance in Life  Participation Level:  Active   Description of Group:    This group will address the concept of balance and how it feels and looks when one is unbalanced. Patients will be encouraged to process areas in their lives that are out of balance, and identify reasons for remaining unbalanced. Facilitators will guide patients utilizing problem- solving interventions to address and correct the stressor making their life unbalanced. Understanding and applying boundaries will be explored and addressed for obtaining  and maintaining a balanced life. Patients will be encouraged to explore ways to assertively make their unbalanced needs known to significant others in their lives, using other group members and facilitator for support and feedback.  Therapeutic Goals: Patient will identify two or more emotions or situations they have that consume much of in their lives. Patient will identify signs/triggers that life has become out of balance:  Patient will identify two ways to set boundaries in order to achieve balance in their lives:  Patient will demonstrate ability to communicate their needs through discussion and/or role plays  Summary of Patient Progress: During group, the patient explored how understanding their methods of giving and receiving love can influence social relationships utilizing an emotionally focused therapeutic Lense. Group members discussed times when they felt disconnected and identified strategies to communicate their emotional needs more effectively, fostering stronger interpersonal connections. Together, the patient and peers engaged in brainstorming ways to balance meeting the needs of others while maintaining personal boundaries and self-care. The patient was open to feedback, actively participated in discussions, and contributed  meaningfully to the overall group dynamic.     Therapeutic Modalities:   Cognitive Behavioral Therapy Solution-Focused Therapy Assertiveness Training   Alveta CHRISTELLA Kerns, LCSW

## 2023-10-30 MED ORDER — HYDROCORTISONE 0.5 % EX CREA
TOPICAL_CREAM | Freq: Two times a day (BID) | CUTANEOUS | Status: DC
  Filled 2023-10-30: qty 28.35

## 2023-10-30 MED ORDER — HYDROCORTISONE 0.5 % EX OINT: TOPICAL_OINTMENT | Freq: Two times a day (BID) | CUTANEOUS | Status: DC

## 2023-10-30 NOTE — Group Note (Signed)
 Recreation Therapy Group Note   Group Topic:Problem Solving  Group Date: 10/30/2023 Start Time: 1005 End Time: 1100 Facilitators: Celestia Jeoffrey BRAVO, LRT, CTRS Location: Craft Room  Group Description: Life Boat. Patients were given the scenario that they are on a boat that is about to become shipwrecked, leaving them stranded on an island. They are asked to make a list of 15 different items that they want to take with them when they are stranded on the [place]. Patients are asked to rank their items from most important to least important, #1 being the most important and #15 being the least. Patients will work individually for the first round to come up with 15 items and then pair up with a peer(s) to condense their list and come up with one list of 15 items between the two of them. Patients or LRT will read aloud the 15 different items to the group after each round. LRT facilitated post-activity processing to discuss how this activity can be used in daily life post discharge.   Goal Area(s) Addressed:  Patient will identify priorities, wants and needs. Patient will communicate with LRT and peers. Patient will work collectively as a administrator, civil service. Patient will work on product manager.    Affect/Mood: N/A   Participation Level: Did not attend    Clinical Observations/Individualized Feedback: Patient did not attend group.   Plan: Continue to engage patient in RT group sessions 2-3x/week.   Jeoffrey BRAVO Celestia, LRT, CTRS 10/30/2023 1:05 PM

## 2023-10-30 NOTE — Progress Notes (Signed)
   10/30/23 1000  Psych Admission Type (Psych Patients Only)  Admission Status Involuntary  Psychosocial Assessment  Patient Complaints Other (Comment) (Itching on both feet.)  Eye Contact Fair  Facial Expression Other (Comment) (WNL)  Affect Appropriate to circumstance  Speech Logical/coherent  Interaction Assertive  Motor Activity Slow  Appearance/Hygiene Unremarkable  Behavior Characteristics Appropriate to situation  Mood Pleasant  Thought Process  Coherency WDL  Content WDL  Delusions None reported or observed  Perception WDL  Hallucination None reported or observed  Judgment Impaired  Confusion None  Danger to Self  Current suicidal ideation? Denies  Agreement Not to Harm Self Yes  Description of Agreement verbal  Danger to Others  Danger to Others None reported or observed

## 2023-10-30 NOTE — Plan of Care (Signed)
   Problem: Safety: Goal: Periods of time without injury will increase Outcome: Progressing

## 2023-10-30 NOTE — Group Note (Signed)
 [facility] LCSW Group Therapy Note   Group Date: 10/30/2023 Start Time: 1315 End Time: 1415  Type of Therapy/Topic:  Group Therapy:  Feelings about Diagnosis  Participation Level:  Active   Description of Group:    This group will allow patients to explore their thoughts and feelings about diagnoses they have received. Patients will be guided to explore their level of understanding and acceptance of these diagnoses. Facilitator will encourage patients to process their thoughts and feelings about the reactions of others to their diagnosis, and will guide patients in identifying ways to discuss their diagnosis with significant others in their lives. This group will be process-oriented, with patients participating in exploration of their own experiences as well as giving and receiving support and challenge from other group members.   Therapeutic Goals: 1. Patient will demonstrate understanding of diagnosis as evidence by identifying two or more symptoms of the disorder:  2. Patient will be able to express two feelings regarding the diagnosis 3. Patient will demonstrate ability to communicate their needs through discussion and/or role plays  Summary of Patient Progress: Patient was present in group.  Patient was engaged and supportive of others in the group.  Patient participated in group discussion.  Patient displayed fair insight.   Therapeutic Modalities:   Cognitive Behavioral Therapy Brief Therapy Feelings Identification    [name], LCSW

## 2023-10-30 NOTE — Progress Notes (Signed)
 [facility] MD Progress Note  10/30/2023 9:07 AM Charlene [name]  MRN:  [id number]   Subjective:  Chart reviewed, case discussed in multidisciplinary meeting, patient seen during rounds.   10/30/2023: On interview today, patient is noted to be sitting on bed in room.  She reports redness and itching on the soles of her feet bilaterally, and few areas of nonpruritic rash on legs.  Benadryl  given, hydrocortisone cream ordered and patient reports this has been effective in relieving itching.  Will continue to monitor closely.  Patient took increased dose of lamotrigine  50 mg yesterday, will discontinue lamotrigine  at this time due to rash.  She is otherwise tolerating medication regimen well without adverse effects.  Patient denies current depressive symptoms, endorses anxiety 5 out of 10 today.  She denies SI/HI/plan and denies hallucinations.  She has remained linear, logical, and future oriented.  She cites her children as a protective factor.  She has maintained safe behaviors on the unit.  She is able to discuss her coping skills, support system, and crisis resources.  She is agreeable to discharge tomorrow. She confirms that her son will remove her gun from the home and secure it so that patient cannot access it.  Safe discharge planning completed with patient's mother, Graylin Hare, with patient's consent. Graylin confirms patient will be returning to live with her and patient's children.  Graylin does not voice any safety concerns at this time and is agreeable to patient's discharge tomorrow.  Provider advised removal of guns or other potentially lethal means from the home, patient's mother reports understanding.  10/29/2023: Patient was seen today on rounds.  Patient was noted to be watching TV in the day room when provider initially was speaking with patient.  Patient preferred to have conversation in her room.  Patient appears to be interacting with her peers appropriately on the unit.  Patient  reported doing well with current medication regimen.  She denied any current known side effects to current medication regimen.  Lamotrigine  was increased to 50 mg today, patient reported tolerating medication well so far, she denied any known rashes or other side effects.  Patient reported sleeping well, about 7 hours a night.  She reported eating well as well.  She denied any suicidal or homicidal ideations.  She denied any auditory or visual hallucinations.  She did report she will be returning back to the house with her mother and daughter.  She did give consent to call her mother, as she reports she feels as though she is ready to be discharged.  She denied any current alcohol withdrawal symptoms.  She denied any access to any weapons in the home.  She did report desire to speak with social work team about ensuring appointments for intensive outpatient programs.  This was relayed to social work team by this provider.  10/26: Upon assessment, patient is found waiting to attend group. Patient reports she is doing good and she slept much better after they raised the head of her bed. Patient reports she is tolerating the lamotrigine  and denies side effects from any of her medications. Discussed increasing medication and patient expressed interest. Reports she is eating and drinking well. Denies any concerns/complaints. She has been actively participating and involved in groups.   Patient denies SI, HI, or AVH. Denies any symptoms of alcohol withdrawal.   10/25: Upon assessment patient is found in the milieu. Patient is pleasant and cooperative. Reports she feels she is doing better. Patient reports she is sleeping and eating  well. She has been active and participating in groups. Denies any concerns/complaints. She denies any side effects from medications.   Patient denies SI, HI, or AVH. Denies any symptoms of alcohol withdrawal.   10/24: On interview today, patient is found resting in her room.  She  notes 1 transient episode of lightheadedness overnight but denies any further episodes today.  She rates depression as 5 out of 10 and anxiety is 5 out of 10 today.  She denies SI/HI/plan and denies hallucinations.  She identifies one of her goals of treatment is to work on pharmacologist.  She is considering IOP upon discharge.  She denies alcohol withdrawal symptoms.  She has been actively participating in group therapies.  She is tolerating current medication regimen well without adverse effects.  She is agreeable to adding Lamictal , which she states she has previously done well on.    Sleep: Good  Appetite:  Good  Past Psychiatric History: see h&P Family History:  Family History  Problem Relation Age of Onset   Hypertension Mother    Heart disease Father    Hypertension Father    Social History:  Social History   Substance and Sexual Activity  Alcohol Use Yes   Comment: patient reports drinking 3-4 shots of vodka or 2-3 glasses of wine most days     Social History   Substance and Sexual Activity  Drug Use No    Social History   Socioeconomic History   Marital status: Divorced    Spouse name: Not on file   Number of children: Not on file   Years of education: Not on file   Highest education level: Not on file  Occupational History   Not on file  Tobacco Use   Smoking status: Never   Smokeless tobacco: Never  Vaping Use   Vaping status: Never Used  Substance and Sexual Activity   Alcohol use: Yes    Comment: patient reports drinking 3-4 shots of vodka or 2-3 glasses of wine most days   Drug use: No   Sexual activity: Yes    Birth control/protection: I.U.D.  Other Topics Concern   Not on file  Social History Narrative   Not on file   Social Drivers of Health   Financial Resource Strain: Not on file  Food Insecurity: No Food Insecurity (10/24/2023)   Hunger Vital Sign    Worried About Running Out of Food in the Last Year: Never true    Ran Out of Food in the  Last Year: Never true  Transportation Needs: No Transportation Needs (10/24/2023)   PRAPARE - Administrator, Civil Service (Medical): No    Lack of Transportation (Non-Medical): No  Physical Activity: Not on file  Stress: Not on file (02/11/2019)  Social Connections: Unknown (05/13/2021)   Received from [facility]   Social Network    Social Network: Not on file   Past Medical History:  Past Medical History:  Diagnosis Date   Anemia    Hypertension     Past Surgical History:  Procedure Laterality Date   GASTRIC BYPASS  2000   HERNIA REPAIR  2004   ROBOTIC ASSISTED LAPAROSCOPIC HYSTERECTOMY AND SALPINGECTOMY Bilateral 11/17/2019   Procedure: XI ROBOTIC ASSISTED LAPAROSCOPIC HYSTERECTOMY AND BILATERAL SALPINGECTOMY;  Surgeon: Kandyce Sor, MD;  Location: [facility];  Service: Gynecology;  Laterality: Bilateral;   VENTRAL HERNIA REPAIR  2004    Current Medications: Current Facility-Administered Medications  Medication Dose Route Frequency Provider Last  Rate Last Admin   acetaminophen  (TYLENOL ) tablet 650 mg  650 mg Oral Q6H PRN Wilkie Majel RAMAN, FNP   650 mg at 10/28/23 1339   alum & mag hydroxide-simeth (MAALOX/MYLANTA) 200-200-20 MG/5ML suspension 30 mL  30 mL Oral Q4H PRN Starkes-Perry, Majel RAMAN, FNP       amLODipine (NORVASC) tablet 5 mg  5 mg Oral Daily Starkes-Perry, Takia S, FNP   5 mg at 10/29/23 9176   buPROPion  (WELLBUTRIN  XL) 24 hr tablet 150 mg  150 mg Oral Daily Starkes-Perry, Takia S, FNP   150 mg at 10/29/23 9176   cholecalciferol  (VITAMIN D3) 25 MCG (1000 UNIT) tablet 1,000 Units  1,000 Units Oral Daily Wilkie Majel RAMAN, FNP   1,000 Units at 10/29/23 9176   haloperidol (HALDOL) tablet 5 mg  5 mg Oral TID PRN Wilkie Majel RAMAN, FNP   5 mg at 10/26/23 1005   And   diphenhydrAMINE  (BENADRYL ) capsule 50 mg  50 mg Oral TID PRN Starkes-Perry, Takia S, FNP   50 mg at 10/30/23 0750   haloperidol lactate (HALDOL) injection 5 mg   5 mg Intramuscular TID PRN Wilkie Majel RAMAN, FNP       And   diphenhydrAMINE  (BENADRYL ) injection 50 mg  50 mg Intramuscular TID PRN Starkes-Perry, Majel RAMAN, FNP       And   LORazepam  (ATIVAN ) injection 2 mg  2 mg Intramuscular TID PRN Wilkie, Majel RAMAN, FNP       haloperidol lactate (HALDOL) injection 10 mg  10 mg Intramuscular TID PRN Wilkie Majel RAMAN, FNP       And   diphenhydrAMINE  (BENADRYL ) injection 50 mg  50 mg Intramuscular TID PRN Starkes-Perry, Majel RAMAN, FNP       And   LORazepam  (ATIVAN ) injection 2 mg  2 mg Intramuscular TID PRN Starkes-Perry, Takia S, FNP       hydrocortisone cream 0.5 %   Topical BID Yobani Schertzer L, PA-C       influenza vac split trivalent PF (FLUZONE) injection 0.5 mL  0.5 mL Intramuscular Tomorrow-1000 Donnelly Mellow, MD       levothyroxine  (SYNTHROID ) tablet 75 mcg  75 mcg Oral Q0600 Starkes-Perry, Takia S, FNP   75 mcg at 10/30/23 0700   magnesium hydroxide (MILK OF MAGNESIA) suspension 30 mL  30 mL Oral Daily PRN Starkes-Perry, Takia S, FNP       multivitamin with minerals tablet 1 tablet  1 tablet Oral Daily Starkes-Perry, Takia S, FNP   1 tablet at 10/29/23 9176   pneumococcal 20-valent conjugate vaccine (PREVNAR 20) injection 0.5 mL  0.5 mL Intramuscular Tomorrow-1000 May, Tanya, NP       thiamine (VITAMIN B1) tablet 100 mg  100 mg Oral Daily Starkes-Perry, Takia S, FNP   100 mg at 10/30/23 9249   Or   thiamine (VITAMIN B1) injection 100 mg  100 mg Intravenous Daily Starkes-Perry, Takia S, FNP       vitamin A & D ointment   Topical PRN Shamiyah Ngu L, PA-C   42.5 Application at 10/29/23 2146    Lab Results:  No results found for this or any previous visit (from the past 48 hours).   Blood Alcohol level:  Lab Results  Component Value Date   ETH 332 [facility]) 10/22/2023   ETH 260 (H) 10/07/2021    Metabolic Disorder Labs: Lab Results  Component Value Date   HGBA1C 4.6 (L) 10/28/2023   MPG 85.32 10/28/2023   No results found  for: PROLACTIN Lab Results  Component Value Date   CHOL 135 10/28/2023   TRIG 53 10/28/2023   HDL 52 10/28/2023   CHOLHDL 2.6 10/28/2023   VLDL 11 10/28/2023   LDLCALC 72 10/28/2023    Physical Findings: AIMS:  , ,  ,  ,    CIWA:  CIWA-Ar Total: 0 COWS:      Psychiatric Specialty Exam:  Presentation  General Appearance:  Appropriate for Environment; Fairly Groomed  Eye Contact: Good  Speech: Clear and Coherent; Normal Rate  Speech Volume: Normal    Mood and Affect  Mood: Euthymic (good)  Affect: Appropriate   Thought Process  Thought Processes: Coherent; Linear  Orientation:Full (Time, Place and Person)  Thought Content:WDL  Hallucinations: None   Ideas of Reference:None  Suicidal Thoughts: No  Homicidal Thoughts: No   Sensorium  Memory: Immediate Fair; Recent Fair; Remote Fair  Judgment: Fair  Insight: Fair   Art Therapist  Concentration: Fair  Attention Span: Fair  Recall: [employer] of Knowledge: Fair  Language: Fair   Psychomotor Activity  Psychomotor Activity: Normal  Musculoskeletal: Strength & Muscle Tone: within normal limits Gait & Station: normal Assets  Assets: Manufacturing Systems Engineer; Vocational/Educational; Housing; Social Support    Physical Exam: Physical Exam Vitals and nursing note reviewed.  Constitutional:      Appearance: Normal appearance.  Eyes:     Conjunctiva/sclera: Conjunctivae normal.  Pulmonary:     Effort: Pulmonary effort is normal.  Neurological:     Mental Status: She is alert and oriented to person, place, and time.    Review of Systems  Respiratory:  Negative for shortness of breath.   Cardiovascular:  Negative for chest pain.  Gastrointestinal:  Negative for nausea and vomiting.  Skin:  Positive for rash.  Psychiatric/Behavioral:  Positive for substance abuse. Negative for depression, hallucinations and suicidal ideas. The patient is nervous/anxious. The  patient does not have insomnia.   All other systems reviewed and are negative.  Blood pressure (!) 134/90, pulse 92, temperature 99.3 F (37.4 C), temperature source Oral, resp. rate 18, height 5' 10 (1.778 m), weight 126.6 kg, last menstrual period 09/17/2019, SpO2 99%. Body mass index is 40.03 kg/m.  Diagnosis: Principal Problem:   MDD (major depressive disorder), recurrent severe, without psychosis (HCC)   PLAN: Safety and Monitoring:  -- Involuntary admission to inpatient psychiatric unit for safety, stabilization and treatment  -- Daily contact with patient to assess and evaluate symptoms and progress in treatment  -- Patient's case to be discussed in multi-disciplinary team meeting  -- Observation Level : q15 minute checks  -- Vital signs:  q12 hours  -- Precautions: suicide, elopement, and assault -- Encouraged patient to participate in unit milieu and in scheduled group therapies   2. Psychiatric Treatment:  Scheduled Medications: Wellbutrin  XL 150 mg Discontinued lamotrigine  due to rash CIWA protocol    -- The risks/benefits/side-effects/alternatives to this medication were discussed in detail with the patient and time was given for questions. The patient consents to medication trial.   3. Medical Issues Being Addressed:  Amlodipine for hypertension Synthroid  for hypothyroidism A+D ointment as barrier cream for inframammary skin folds BMP repeated on 10/26/23 at recommendation of internal medicine Hydrocortisone cream for itching/redness on feet  4. Discharge Planning:   -- Social work and case management to assist with discharge planning and identification of hospital follow-up needs prior to discharge  -- Estimated LOS: 5-7 days  [employer], PA-C

## 2023-10-30 NOTE — Progress Notes (Signed)
   10/30/23 2000  Psych Admission Type (Psych Patients Only)  Admission Status Involuntary  Psychosocial Assessment  Patient Complaints None  Eye Contact Fair  Facial Expression Animated  Affect Appropriate to circumstance  Speech Logical/coherent  Interaction Assertive  Motor Activity Slow  Appearance/Hygiene Unremarkable  Behavior Characteristics Appropriate to situation  Mood Pleasant  Aggressive Behavior  Effect Self-harm  Thought Process  Coherency WDL  Content WDL  Delusions None reported or observed  Perception WDL  Hallucination None reported or observed  Judgment Impaired  Confusion None  Danger to Self  Current suicidal ideation? Denies  Agreement Not to Harm Self Yes  Description of Agreement verbal  Danger to Others  Danger to Others None reported or observed

## 2023-10-30 NOTE — Plan of Care (Signed)
   Problem: Education: Goal: Emotional status will improve Outcome: Progressing Goal: Mental status will improve Outcome: Progressing Goal: Verbalization of understanding the information provided will improve Outcome: Progressing

## 2023-10-30 NOTE — Plan of Care (Signed)
  Problem: Education: Goal: Emotional status will improve Outcome: Progressing Goal: Mental status will improve Outcome: Progressing Goal: Verbalization of understanding the information provided will improve Outcome: Progressing   Problem: Activity: Goal: Interest or engagement in activities will improve Outcome: Progressing   Problem: Health Behavior/Discharge Planning: Goal: Compliance with treatment plan for underlying cause of condition will improve Outcome: Progressing   Problem: Safety: Goal: Periods of time without injury will increase Outcome: Progressing

## 2023-10-30 NOTE — Group Note (Signed)
 Date:  10/30/2023 Time:  9:27 PM  Group Topic/Focus:  Wrap-Up Group:   The focus of this group is to help patients review their daily goal of treatment and discuss progress on daily workbooks.    Participation Level:  Active  Participation Quality:  Appropriate  Affect:  Appropriate  Cognitive:  Appropriate  Insight: Good  Engagement in Group:  Engaged  Modes of Intervention:  Socialization  Additional Comments:    Kristen VEAR Gibbon 10/30/2023, 9:27 PM

## 2023-10-31 MED ORDER — AMLODIPINE BESYLATE 5 MG PO TABS: 5.0000 mg | ORAL_TABLET | Freq: Every day | ORAL | 0 refills | Status: AC

## 2023-10-31 MED ORDER — VITAMIN B-1 100 MG PO TABS: 100.0000 mg | ORAL_TABLET | Freq: Every day | ORAL | 0 refills | Status: AC

## 2023-10-31 MED ORDER — LEVOTHYROXINE SODIUM 75 MCG PO TABS: 75.0000 ug | ORAL_TABLET | Freq: Every day | ORAL | 0 refills | Status: AC

## 2023-10-31 MED ORDER — VITAMINS A & D EX OINT: TOPICAL_OINTMENT | CUTANEOUS | 0 refills | Status: DC | PRN

## 2023-10-31 MED ORDER — BUPROPION HCL ER (XL) 150 MG PO TB24: 150.0000 mg | ORAL_TABLET | Freq: Every day | ORAL | 0 refills | Status: DC

## 2023-10-31 MED ORDER — HYDROCORTISONE 0.5 % EX CREA: TOPICAL_CREAM | Freq: Two times a day (BID) | CUTANEOUS | 0 refills | Status: DC

## 2023-10-31 NOTE — Progress Notes (Signed)
 Patient pleasant and cooperative on approach. Denies SI,HI and AVH. Verbalized understanding discharge instructions,prescriptions and follow up care. All belongings returned from [employer]. Suicide safety plan filled by patient and placed in chart. Copy given to patient.Patient escorted out by staff and transported by cab.

## 2023-10-31 NOTE — Group Note (Signed)
 Date:  10/31/2023 Time:  11:05 AM  Group Topic/Focus:  Icebreaker Group: The focus of the group is to explain the functions and rules of the unit and also allow patients to introduce themselves to each other and name a positive feature about themselves.  Explains the roles and duties of every staff member on the milieu and how the each day will be structured for their care.     Participation Level:  Active  Participation Quality:  Appropriate  Affect:  Appropriate  Cognitive:  Appropriate  Insight: Appropriate  Engagement in Group:  Engaged  Modes of Intervention:  Activity  Additional Comments:    Camellia HERO Minah Axelrod 10/31/2023, 11:05 AM

## 2023-10-31 NOTE — Progress Notes (Signed)
  [facility] Adult Case Management Discharge Plan :  Will you be returning to the same living situation after discharge:  Yes,  pt reports that she is returning home.  At discharge, do you have transportation home?: Yes,  CSW to assist with transportation needs.  Do you have the ability to pay for your medications: Yes,  pt reports that she has [employer].  Chart indicates that the patient does not have insurance.   Release of information consent forms completed and in the chart;  Patient's signature needed at discharge.  Patient to Follow up at:  Follow-up Information     [facility] Follow up.   Why: Appointment is scheduled for nurse 8:40AM with a nurse and medical provider at 9:20AM on 11/01/2023.  Appointments are virtual, you will received a link in your email.  Appointment will be classified as medical.  You will meet with a nurse for 40 mins then a medication provider for 40 minutes.  They will assist you in developing a plan of care (therapy, medications, peer support).  Urine testing is a requirement.  They can send them discreetly to your home if you wish, at this time it is indicated in your chart that you will pick them up, you can call to change this choice.  You will receive several emails, you'll need to complete prior to appointments.  You can call [phone] to change appointments/speak with the staff. Contact information: [address] [place] [address] [phone]                 Next level of care provider has access to [facility] Link:no  Safety Planning and Suicide Prevention discussed: Yes,  SPE completed with the patient.  Patient declined collateral contacts at this time.      Has patient been referred to the Quitline?: Patient does not use tobacco/nicotine products  Patient has been referred for addiction treatment: No known substance use disorder.  [name], LCSW 10/31/2023, 9:23 AM

## 2023-10-31 NOTE — [facility] Suicide Risk Assessment (Signed)
 [facility] Discharge Suicide Risk Assessment   Principal Problem: MDD (major depressive disorder), recurrent severe, without psychosis (HCC) Discharge Diagnoses: Principal Problem:   MDD (major depressive disorder), recurrent severe, without psychosis (HCC)   Total Time spent with patient: 30 minutes  Musculoskeletal: Strength & Muscle Tone: within normal limits Gait & Station: normal Patient leans: N/A  Psychiatric Specialty Exam  Presentation  General Appearance:  Appropriate for Environment; Casual  Eye Contact: Fair  Speech: Clear and Coherent  Speech Volume: Normal  Handedness: Right   Mood and Affect  Mood: Euthymic  Duration of Depression Symptoms: No data recorded Affect: Appropriate   Thought Process  Thought Processes: Coherent  Descriptions of Associations:Intact  Orientation:Full (Time, Place and Person)  Thought Content:Logical  History of Schizophrenia/Schizoaffective disorder:No data recorded Duration of Psychotic Symptoms:No data recorded Hallucinations:Hallucinations: None  Ideas of Reference:None  Suicidal Thoughts:Suicidal Thoughts: No  Homicidal Thoughts:Homicidal Thoughts: No   Sensorium  Memory: Immediate Fair; Remote Fair  Judgment: Fair  Insight: Fair   Art Therapist  Concentration: Fair  Attention Span: Fair  Recall: [employer] of Knowledge: Fair  Language: Fair   Psychomotor Activity  Psychomotor Activity:No data recorded  Assets  Assets: Communication Skills; Desire for Improvement; Social Support   Sleep  Sleep: Sleep: Fair  Estimated Sleeping Duration (Last 24 Hours): 4.00-4.75 hours  Physical Exam: Physical Exam ROS Blood pressure 133/67, pulse 90, temperature 98.6 F (37 C), temperature source Oral, resp. rate 16, height 5' 10 (1.778 m), weight 126.6 kg, last menstrual period 09/17/2019, SpO2 99%. Body mass index is 40.03 kg/m.  Mental Status Per Nursing Assessment::   On  Admission:  NA  Demographic Factors:  Low socioeconomic status  Loss Factors: Decrease in vocational status  Historical Factors: Impulsivity  Risk Reduction Factors:   Positive social support, Positive therapeutic relationship, and Positive coping skills or problem solving skills  Continued Clinical Symptoms:  Previous Psychiatric Diagnoses and Treatments  Cognitive Features That Contribute To Risk:  None    Suicide Risk:  Minimal: No identifiable suicidal ideation.  Patients presenting with no risk factors but with morbid ruminations; may be classified as minimal risk based on the severity of the depressive symptoms   Follow-up Information     [facility] Follow up.   Why: Appointment is scheduled for nurse 8:40AM with a nurse and medical provider at 9:20AM on 11/01/2023.  Appointments are virtual, you will received a link in your email.  Appointment will be classified as medical.  You will meet with a nurse for 40 mins then a medication provider for 40 minutes.  They will assist you in developing a plan of care (therapy, medications, peer support).  Urine testing is a requirement.  They can send them discreetly to your home if you wish, at this time it is indicated in your chart that you will pick them up, you can call to change this choice.  You will receive several emails, you'll need to complete prior to appointments.  You can call [phone] to change appointments/speak with the staff. Contact information: [address] [place] [address] [phone]                 Plan Of Care/Follow-up recommendations:  Activity:  As tolerated  Allyn Foil, MD 10/31/2023, 9:46 AM

## 2023-10-31 NOTE — Discharge Summary (Signed)
 Physician Discharge Summary Note  Patient:  Charlene [name] is an 53 y.o., female MRN:  [id number] DOB:  17-Sep-1970 Patient phone:  [phone] (home)  Patient address:   [address] [place] [address],   Total time spent: 40 min Date of Admission:  10/24/2023 Date of Discharge: 10/31/23  Reason for Admission:  Patient here after a suicide attempt where she took 60 tablets of 25 mg hydroxyzine . She admits alcohol use as well. She is tearful on exam. Normal vitals with EMS. She has a history of anxiety but no major mental health problems otherwise. Sounds like maybe history of alcohol abuse. She will not answer any questions about why she did this. She denies any chest pain shortness of breath abdominal pain nausea or vomiting. But she was little nauseous with EMS. Patient is admitted to adult psych unit with Q15 min safety monitoring. Multidisciplinary team approach is offered. Medication management; group/milieu therapy is offered.   Principal Problem: MDD (major depressive disorder), recurrent severe, without psychosis (HCC) Discharge Diagnoses: Principal Problem:   MDD (major depressive disorder), recurrent severe, without psychosis (HCC)   Past Psychiatric History: see h&p  Family Psychiatric  History: see h&p Social History:  Social History   Substance and Sexual Activity  Alcohol Use Yes   Comment: patient reports drinking 3-4 shots of vodka or 2-3 glasses of wine most days     Social History   Substance and Sexual Activity  Drug Use No    Social History   Socioeconomic History   Marital status: Divorced    Spouse name: Not on file   Number of children: Not on file   Years of education: Not on file   Highest education level: Not on file  Occupational History   Not on file  Tobacco Use   Smoking status: Never   Smokeless tobacco: Never  Vaping Use   Vaping status: Never Used  Substance and Sexual Activity   Alcohol use: Yes    Comment:  patient reports drinking 3-4 shots of vodka or 2-3 glasses of wine most days   Drug use: No   Sexual activity: Yes    Birth control/protection: I.U.D.  Other Topics Concern   Not on file  Social History Narrative   Not on file   Social Drivers of Health   Financial Resource Strain: Not on file  Food Insecurity: No Food Insecurity (10/24/2023)   Hunger Vital Sign    Worried About Running Out of Food in the Last Year: Never true    Ran Out of Food in the Last Year: Never true  Transportation Needs: No Transportation Needs (10/24/2023)   PRAPARE - Administrator, Civil Service (Medical): No    Lack of Transportation (Non-Medical): No  Physical Activity: Not on file  Stress: Not on file (02/11/2019)  Social Connections: Unknown (05/13/2021)   Received from [facility]   Social Network    Social Network: Not on file   Past Medical History:  Past Medical History:  Diagnosis Date   Anemia    Hypertension     Past Surgical History:  Procedure Laterality Date   GASTRIC BYPASS  2000   HERNIA REPAIR  2004   ROBOTIC ASSISTED LAPAROSCOPIC HYSTERECTOMY AND SALPINGECTOMY Bilateral 11/17/2019   Procedure: XI ROBOTIC ASSISTED LAPAROSCOPIC HYSTERECTOMY AND BILATERAL SALPINGECTOMY;  Surgeon: Kandyce Sor, MD;  Location: [facility];  Service: Gynecology;  Laterality: Bilateral;   VENTRAL HERNIA REPAIR  2004   Family History:  Family History  Problem Relation Age of Onset   Hypertension Mother    Heart disease Father    Hypertension Father     Hospital Course:  Patient here after a suicide attempt where she took 60 tablets of 25 mg hydroxyzine . She admits alcohol use as well. She is tearful on exam. Normal vitals with EMS. She has a history of anxiety but no major mental health problems otherwise. Sounds like maybe history of alcohol abuse. She will not answer any questions about why she did this. She denies any chest pain shortness of breath abdominal pain  nausea or vomiting. But she was little nauseous with EMS.  Detailed risk assessment is complete based on clinical exam and individual risk factors and acute suicide risk is low and acute violence risk is low.   On admission patient was on CIWA protocol for alcohol detox and Wellbutrin  XL 150 mg was initiated to help with the mood.  Lamotrigine  was initially started as mood stabilizer but patient had rash and itching on her feet after few days and lamotrigine  was discontinued.  Rash disappeared and improved with hydrocortisone.  Patient maintained safe behaviors on the unit and tolerated Wellbutrin  well with no reported side effects.  She participated in groups and treatment plan.  On the day of discharge she consistently denied SI/HI/plan and denied hallucinations.  She remains future oriented and is willing to participate in outpatient mental health services. Currently, all modifiable risk of harm to self/harm to others have been addressed and patient is no longer appropriate for the acute inpatient setting and is able to continue treatment for mental health needs in the community with the supports as indicated below.  Patient is educated and verbalized understanding of discharge plan of care including medications, follow-up appointments, mental health resources and further crisis services in the community.  He is instructed to call 911 or present to the nearest emergency room should he experience any decompensation in mood, disturbance of bowel or return of suicidal/homicidal ideations.  Patient verbalizes understanding of this education and agrees to this plan of care  Physical Findings: AIMS:  , ,  ,  ,    CIWA:  CIWA-Ar Total: 0 COWS:        Psychiatric Specialty Exam:  Presentation  General Appearance:  Appropriate for Environment; Casual  Eye Contact: Fair  Speech: Clear and Coherent  Speech Volume: Normal    Mood and Affect  Mood: Euthymic  Affect: Appropriate   Thought  Process  Thought Processes: Coherent  Descriptions of Associations:Intact  Orientation:Full (Time, Place and Person)  Thought Content:Logical  Hallucinations:Hallucinations: None  Ideas of Reference:None  Suicidal Thoughts:Suicidal Thoughts: No  Homicidal Thoughts:Homicidal Thoughts: No   Sensorium  Memory: Immediate Fair; Remote Fair  Judgment: Fair  Insight: Fair   Art Therapist  Concentration: Fair  Attention Span: Fair  Recall: Fair  Fund of Knowledge: Fair  Language: Fair   Psychomotor Activity  Psychomotor Activity:No data recorded Musculoskeletal: Strength & Muscle Tone: within normal limits Gait & Station: normal Assets  Assets: Manufacturing Systems Engineer; Desire for Improvement; Social Support   Sleep  Sleep: Sleep: Fair    Physical Exam: Physical Exam Vitals and nursing note reviewed.    ROS Blood pressure 133/67, pulse 90, temperature 98.6 F (37 C), temperature source Oral, resp. rate 16, height 5' 10 (1.778 m), weight 126.6 kg, last menstrual period 09/17/2019, SpO2 99%. Body mass index is 40.03 kg/m.   Social History   Tobacco Use  Smoking Status  Never  Smokeless Tobacco Never   Tobacco Cessation:  A prescription for an FDA-approved tobacco cessation medication was offered at discharge and the patient refused   Blood Alcohol level:  Lab Results  Component Value Date   ETH 332 (HH) 10/22/2023   ETH 260 (H) 10/07/2021    Metabolic Disorder Labs:  Lab Results  Component Value Date   HGBA1C 4.6 (L) 10/28/2023   MPG 85.32 10/28/2023   No results found for: PROLACTIN Lab Results  Component Value Date   CHOL 135 10/28/2023   TRIG 53 10/28/2023   HDL 52 10/28/2023   CHOLHDL 2.6 10/28/2023   VLDL 11 10/28/2023   LDLCALC 72 10/28/2023    See Psychiatric Specialty Exam and Suicide Risk Assessment completed by Attending Physician prior to discharge.  Discharge destination:  Home  Is patient on multiple  antipsychotic therapies at discharge:  No   Has Patient had three or more failed trials of antipsychotic monotherapy by history:  No  Recommended Plan for Multiple Antipsychotic Therapies: NA   Allergies as of 10/31/2023       Reactions   Penicillins Hives, Shortness Of Breath   Heart rate increases   Shellfish Allergy Anaphylaxis   Swells up face Swells up face        Medication List     STOP taking these medications    acetaminophen  500 MG tablet Commonly known as: TYLENOL    cholecalciferol  25 MCG (1000 UNIT) tablet Commonly known as: VITAMIN D3   cyanocobalamin 1000 MCG/ML injection Commonly known as: VITAMIN B12   folic acid 1 MG tablet Commonly known as: FOLVITE   hydrochlorothiazide 12.5 MG capsule Commonly known as: MICROZIDE   hydrOXYzine  25 MG tablet Commonly known as: ATARAX    lamoTRIgine  200 MG tablet Commonly known as: LaMICtal    multivitamin with minerals Tabs tablet   Potassium 99 MG Tabs   Vitamin D  (Ergocalciferol ) 1.25 MG (50000 UNIT) Caps capsule Commonly known as: DRISDOL       TAKE these medications      Indication  amLODipine 5 MG tablet Commonly known as: NORVASC Take 1 tablet (5 mg total) by mouth daily.  Indication: High Blood Pressure   buPROPion  150 MG 24 hr tablet Commonly known as: WELLBUTRIN  XL Take 1 tablet (150 mg total) by mouth daily.  Indication: Major Depressive Disorder   hydrocortisone cream 0.5 % Apply topically 2 (two) times daily.  Indication: Skin Inflammation   levothyroxine  75 MCG tablet Commonly known as: SYNTHROID  Take 1 tablet (75 mcg total) by mouth daily at 6 (six) AM. What changed: when to take this  Indication: Underactive Thyroid   thiamine 100 MG tablet Commonly known as: Vitamin B-1 Take 1 tablet (100 mg total) by mouth daily.  Indication: Deficiency of Vitamin B1   vitamin A & D ointment Apply topically as needed (skin protectant).  Indication: Weight Loss        Follow-up  Information     [facility] Follow up.   Why: Appointment is scheduled for nurse 8:40AM with a nurse and medical provider at 9:20AM on 11/01/2023.  Appointments are virtual, you will received a link in your email.  Appointment will be classified as medical.  You will meet with a nurse for 40 mins then a medication provider for 40 minutes.  They will assist you in developing a plan of care (therapy, medications, peer support).  Urine testing is a requirement.  They can send them discreetly to your home if  you wish, at this time it is indicated in your chart that you will pick them up, you can call to change this choice.  You will receive several emails, you'll need to complete prior to appointments.  You can call [phone] to change appointments/speak with the staff. Contact information: [address] [place] [address] [phone]                 Follow-up recommendations:  Activity:  as tolerated    Signed: Janeya Deyo, MD 11/01/2023, 7:15 PM

## 2024-01-02 ENCOUNTER — Encounter: Payer: Self-pay | Admitting: Behavioral Health

## 2024-01-02 ENCOUNTER — Emergency Department (HOSPITAL_COMMUNITY)
Admission: EM | Admit: 2024-01-02 | Discharge: 2024-01-02 | Disposition: A | Discharge status: Other Acute Inpatient Hospital | Payer: 59 | Attending: Emergency Medicine | Admitting: Emergency Medicine

## 2024-01-02 ENCOUNTER — Other Ambulatory Visit: Payer: Self-pay

## 2024-01-02 ENCOUNTER — Inpatient Hospital Stay
Admission: RE | Admit: 2024-01-02 | Discharge: 2024-01-08 | DRG: 885 | Disposition: A | Discharge status: Home / Self Care | Payer: 59 | Source: Intra-hospital | Attending: Psychiatry | Admitting: Psychiatry

## 2024-01-02 DIAGNOSIS — F329 Major depressive disorder, single episode, unspecified: Secondary | ICD-10-CM

## 2024-01-02 DIAGNOSIS — E039 Hypothyroidism, unspecified: Secondary | ICD-10-CM | POA: Diagnosis present

## 2024-01-02 DIAGNOSIS — F419 Anxiety disorder, unspecified: Secondary | ICD-10-CM | POA: Diagnosis present

## 2024-01-02 DIAGNOSIS — F319 Bipolar disorder, unspecified: Secondary | ICD-10-CM | POA: Insufficient documentation

## 2024-01-02 DIAGNOSIS — Z8249 Family history of ischemic heart disease and other diseases of the circulatory system: Secondary | ICD-10-CM

## 2024-01-02 DIAGNOSIS — T50904A Poisoning by unspecified drugs, medicaments and biological substances, undetermined, initial encounter: Secondary | ICD-10-CM

## 2024-01-02 DIAGNOSIS — F322 Major depressive disorder, single episode, severe without psychotic features: Secondary | ICD-10-CM | POA: Diagnosis present

## 2024-01-02 DIAGNOSIS — F1090 Alcohol use, unspecified, uncomplicated: Secondary | ICD-10-CM

## 2024-01-02 DIAGNOSIS — I10 Essential (primary) hypertension: Secondary | ICD-10-CM | POA: Diagnosis present

## 2024-01-02 DIAGNOSIS — Z880 Allergy status to penicillin: Secondary | ICD-10-CM

## 2024-01-02 DIAGNOSIS — F332 Major depressive disorder, recurrent severe without psychotic features: Principal | ICD-10-CM | POA: Diagnosis present

## 2024-01-02 DIAGNOSIS — T43592A Poisoning by other antipsychotics and neuroleptics, intentional self-harm, initial encounter: Secondary | ICD-10-CM | POA: Diagnosis not present

## 2024-01-02 DIAGNOSIS — Z9884 Bariatric surgery status: Secondary | ICD-10-CM

## 2024-01-02 DIAGNOSIS — Z9151 Personal history of suicidal behavior: Secondary | ICD-10-CM

## 2024-01-02 DIAGNOSIS — Y908 Blood alcohol level of 240 mg/100 ml or more: Secondary | ICD-10-CM | POA: Insufficient documentation

## 2024-01-02 DIAGNOSIS — Z79899 Other long term (current) drug therapy: Secondary | ICD-10-CM

## 2024-01-02 DIAGNOSIS — Z91013 Allergy to seafood: Secondary | ICD-10-CM

## 2024-01-02 DIAGNOSIS — T50902A Poisoning by unspecified drugs, medicaments and biological substances, intentional self-harm, initial encounter: Secondary | ICD-10-CM | POA: Insufficient documentation

## 2024-01-02 DIAGNOSIS — F1010 Alcohol abuse, uncomplicated: Secondary | ICD-10-CM | POA: Insufficient documentation

## 2024-01-02 DIAGNOSIS — Z90710 Acquired absence of both cervix and uterus: Secondary | ICD-10-CM

## 2024-01-02 LAB — URINE DRUG SCREEN
Amphetamines: NEGATIVE
Barbiturates: NEGATIVE
Benzodiazepines: NEGATIVE
Cocaine: NEGATIVE
Fentanyl: NEGATIVE
Methadone Scn, Ur: NEGATIVE
Opiates: NEGATIVE
Tetrahydrocannabinol: NEGATIVE

## 2024-01-02 LAB — CBC WITH DIFFERENTIAL/PLATELET
Abs Immature Granulocytes: 0 K/uL (ref 0.00–0.07)
Basophils Absolute: 0.1 K/uL (ref 0.0–0.1)
Basophils Relative: 2 %
Eosinophils Absolute: 0.2 K/uL (ref 0.0–0.5)
Eosinophils Relative: 5 %
HCT: 32.6 % — ABNORMAL LOW (ref 36.0–46.0)
Hemoglobin: 11 g/dL — ABNORMAL LOW (ref 12.0–15.0)
Immature Granulocytes: 0 %
Lymphocytes Relative: 51 %
Lymphs Abs: 1.9 K/uL (ref 0.7–4.0)
MCH: 25.3 pg — ABNORMAL LOW (ref 26.0–34.0)
MCHC: 33.7 g/dL (ref 30.0–36.0)
MCV: 75.1 fL — ABNORMAL LOW (ref 80.0–100.0)
Monocytes Absolute: 0.4 K/uL (ref 0.1–1.0)
Monocytes Relative: 12 %
Neutro Abs: 1.1 K/uL — ABNORMAL LOW (ref 1.7–7.7)
Neutrophils Relative %: 30 %
Platelets: 220 K/uL (ref 150–400)
RBC: 4.34 MIL/uL (ref 3.87–5.11)
RDW: 17.8 % — ABNORMAL HIGH (ref 11.5–15.5)
WBC: 3.7 K/uL — ABNORMAL LOW (ref 4.0–10.5)
nRBC: 0 % (ref 0.0–0.2)

## 2024-01-02 LAB — ACETAMINOPHEN LEVEL
Acetaminophen (Tylenol), Serum: <10 ug/mL — ABNORMAL LOW (ref 10–30)
Acetaminophen (Tylenol), Serum: <10 ug/mL — ABNORMAL LOW (ref 10–30)

## 2024-01-02 LAB — COMPREHENSIVE METABOLIC PANEL WITH GFR
ALT: 22 U/L (ref 0–44)
AST: 49 U/L — ABNORMAL HIGH (ref 15–41)
Albumin: 4.1 g/dL (ref 3.5–5.0)
Alkaline Phosphatase: 102 U/L (ref 38–126)
Anion gap: 15 (ref 5–15)
BUN: 8 mg/dL (ref 6–20)
CO2: 19 mmol/L — ABNORMAL LOW (ref 22–32)
Calcium: 8.6 mg/dL — ABNORMAL LOW (ref 8.9–10.3)
Chloride: 105 mmol/L (ref 98–111)
Creatinine, Ser: 0.56 mg/dL (ref 0.44–1.00)
GFR, Estimated: >60 mL/min
Glucose, Bld: 99 mg/dL (ref 70–99)
Potassium: 3.8 mmol/L (ref 3.5–5.1)
Sodium: 140 mmol/L (ref 135–145)
Total Bilirubin: 0.6 mg/dL (ref 0.0–1.2)
Total Protein: 8 g/dL (ref 6.5–8.1)

## 2024-01-02 LAB — SALICYLATE LEVEL: Salicylate Lvl: <7 mg/dL — ABNORMAL LOW (ref 7.0–30.0)

## 2024-01-02 LAB — PREGNANCY, URINE: Preg Test, Ur: NEGATIVE

## 2024-01-02 LAB — ETHANOL: Alcohol, Ethyl (B): 276 mg/dL — ABNORMAL HIGH

## 2024-01-02 MED ORDER — AMLODIPINE BESYLATE 5 MG PO TABS: 5.0000 mg | ORAL_TABLET | Freq: Every day | ORAL | Status: DC

## 2024-01-02 MED ORDER — LOPERAMIDE HCL 2 MG PO CAPS: 2.0000 mg | ORAL_CAPSULE | ORAL | Status: AC | PRN

## 2024-01-02 MED ORDER — LORAZEPAM 1 MG PO TABS: 1.0000 mg | ORAL_TABLET | Freq: Four times a day (QID) | ORAL | Status: DC | PRN

## 2024-01-02 MED ORDER — BUPROPION HCL ER (XL) 150 MG PO TB24: 150.0000 mg | ORAL_TABLET | Freq: Every day | ORAL | Status: DC

## 2024-01-02 MED ORDER — ACETAMINOPHEN 325 MG PO TABS: 650.0000 mg | ORAL_TABLET | Freq: Four times a day (QID) | ORAL | Status: DC | PRN

## 2024-01-02 MED ORDER — THIAMINE MONONITRATE 100 MG PO TABS
100.0000 mg | ORAL_TABLET | Freq: Every day | ORAL | Status: DC
  Administered 2024-01-03 – 2024-01-08 (×6): 100 mg via ORAL
  Filled 2024-01-02 (×6): qty 1

## 2024-01-02 MED ORDER — LEVOTHYROXINE SODIUM 50 MCG PO TABS
75.0000 ug | ORAL_TABLET | Freq: Every day | ORAL | Status: DC
  Administered 2024-01-03 – 2024-01-08 (×6): 75 ug via ORAL
  Filled 2024-01-02 (×6): qty 2

## 2024-01-02 MED ORDER — ADULT MULTIVITAMIN W/MINERALS CH
1.0000 | ORAL_TABLET | Freq: Every day | ORAL | Status: DC
  Administered 2024-01-03 – 2024-01-08 (×6): 1 via ORAL
  Filled 2024-01-02 (×6): qty 1

## 2024-01-02 MED ORDER — BUPROPION HCL ER (XL) 150 MG PO TB24
150.0000 mg | ORAL_TABLET | Freq: Every day | ORAL | Status: DC
  Administered 2024-01-03 – 2024-01-08 (×6): 150 mg via ORAL
  Filled 2024-01-02 (×6): qty 1

## 2024-01-02 MED ORDER — ADULT MULTIVITAMIN W/MINERALS CH
1.0000 | ORAL_TABLET | Freq: Every day | ORAL | Status: DC
  Administered 2024-01-02: 1 via ORAL
  Filled 2024-01-02: qty 1

## 2024-01-02 MED ORDER — THIAMINE MONONITRATE 100 MG PO TABS
100.0000 mg | ORAL_TABLET | Freq: Every day | ORAL | Status: DC
  Administered 2024-01-02: 100 mg via ORAL
  Filled 2024-01-02: qty 1

## 2024-01-02 MED ORDER — LOPERAMIDE HCL 2 MG PO CAPS: 2.0000 mg | ORAL_CAPSULE | ORAL | Status: DC | PRN

## 2024-01-02 MED ORDER — AMLODIPINE BESYLATE 5 MG PO TABS
5.0000 mg | ORAL_TABLET | Freq: Every day | ORAL | Status: DC
  Administered 2024-01-03 – 2024-01-08 (×6): 5 mg via ORAL
  Filled 2024-01-02 (×6): qty 1

## 2024-01-02 MED ORDER — LORAZEPAM 1 MG PO TABS
1.0000 mg | ORAL_TABLET | Freq: Four times a day (QID) | ORAL | Status: AC | PRN
  Administered 2024-01-04: 1 mg via ORAL

## 2024-01-02 MED ORDER — LEVOTHYROXINE SODIUM 50 MCG PO TABS: 75.0000 ug | ORAL_TABLET | Freq: Every day | ORAL | Status: DC

## 2024-01-02 NOTE — ED Provider Notes (Addendum)
 Patient signed out to myself at shift change.  Of note patient drank alcohol and took 10 to 15 pills of hydroxyzine  at approximately 1045 today.  Patient notes that she just wanted to sleep.  Poison control was contacted and recommended repeat Tylenol  level in 4 hours as well as monitoring and observation in the emergency department for 6 hours postingestion. Physical Exam  BP (!) 135/92 (BP Location: Left Arm)   Pulse 82   Temp 98.1 F (36.7 C) (Oral)   Resp 18   LMP 09/17/2019   SpO2 100%   Physical Exam  Procedures  Procedures  ED Course / MDM   Clinical Course as of 01/02/24 1657  Wed Jan 02, 2024  1140 4 hours Tylenol  level, 6 hours obs & back to baseline for obs.  [JB]  1143 10:45am took meds [JB]    Clinical Course User Index [JB] Barrett, Warren SAILOR, PA-C   Medical Decision Making Amount and/or Complexity of Data Reviewed Labs: ordered.  Risk Prescription drug management.   Tylenol  level does remain negative and repeat EKG demonstrates improved QTc.  Patient is medically clear for TTS evaluation at this time.  She is 6 hours postingestion.  Spoke with poison control at 1732.  They note that patient is medically clear at this time.       Charlene Lonni BIRCH, PA-C 01/02/24 1658    Charlene Lonni BIRCH, PA-C 01/02/24 1732    Patt Alm Macho, MD 01/02/24 2230

## 2024-01-02 NOTE — Progress Notes (Addendum)
 Pt has been accepted to [facility] ON 01/02/2024 . Bed assignment: 323   Pt meets inpatient criteria per Wyline Pizza, NP   Attending Physician will be Dr. Ruther   Report can be called to: - [phone]  Pt can arrive after tonight   Care Team Notified: [facility] Cherylynn Ernst, RN, Raymondo Axe, RN, Wyline Pizza, NP

## 2024-01-02 NOTE — ED Notes (Signed)
 Patient changed out into burgandy scrubs. 1 patient belonging bag to locker 27. Wanded by security.

## 2024-01-02 NOTE — Plan of Care (Signed)
 New admission.  Problem: Education: Goal: Knowledge of [facility] Education information/materials will improve Outcome: Not Progressing Goal: Emotional status will improve Outcome: Not Progressing Goal: Mental status will improve Outcome: Not Progressing Goal: Verbalization of understanding the information provided will improve Outcome: Not Progressing   Problem: Activity: Goal: Interest or engagement in activities will improve Outcome: Not Progressing Goal: Sleeping patterns will improve Outcome: Not Progressing   Problem: Coping: Goal: Ability to verbalize frustrations and anger appropriately will improve Outcome: Not Progressing Goal: Ability to demonstrate self-control will improve Outcome: Not Progressing   Problem: Health Behavior/Discharge Planning: Goal: Identification of resources available to assist in meeting health care needs will improve Outcome: Not Progressing Goal: Compliance with treatment plan for underlying cause of condition will improve Outcome: Not Progressing   Problem: Physical Regulation: Goal: Ability to maintain clinical measurements within normal limits will improve Outcome: Not Progressing   Problem: Safety: Goal: Periods of time without injury will increase Outcome: Not Progressing   Problem: Education: Goal: Knowledge of [facility] Education information/materials will improve Outcome: Not Progressing Goal: Emotional status will improve Outcome: Not Progressing Goal: Mental status will improve Outcome: Not Progressing Goal: Verbalization of understanding the information provided will improve Outcome: Not Progressing   Problem: Safety: Goal: Periods of time without injury will increase Outcome: Not Progressing   Problem: Coping: Goal: Coping ability will improve Outcome: Not Progressing Goal: Will verbalize feelings Outcome: Not Progressing   Problem: Self-Concept: Goal: Level of anxiety will decrease Outcome:  Not Progressing   Problem: Education: Goal: Ability to make informed decisions regarding treatment will improve Outcome: Not Progressing   Problem: Coping: Goal: Coping ability will improve Outcome: Not Progressing

## 2024-01-02 NOTE — ED Triage Notes (Signed)
 Patient BIB EMS from home. Patients daughter states patient took approx.10 -Hydroxyzine  25mg  tablets of the daughters prescription meds. Patient also had a 22 oz beer and several glasses of wine. When asked why she took them, if to self harm she states she just wanted to sleep.   EMS contacted Poison Control. Instructed to watch for Tachycardia and prolonged QT.   Hydroxyzine  was taken about 1 hour ago, 10:30am.   Per EMS:  20g Left Forearm, NS bolus given  80 NSR 137/91 98% RA CBG 133

## 2024-01-02 NOTE — ED Provider Notes (Signed)
 " Charlene [name] Provider Note   CSN: [id number] Arrival date & time: 01/02/24  1130     Patient presents with: No chief complaint on file.   Charlene [name] is a 53 y.o. female.  Patient with past medical history of suicidal ideation, major depressive disorder, suicide attempt, alcohol abuse, QT prolongation presents to emergency room with complaint of overdose.  Patient reports that approximately 1045 today she took 10 to 15 pills of hydroxyzine  25 mg.  She also was drinking beer and wine, an unknown amount.  She reports daily alcohol use.  She has history of similar in the past.  She reports that she did this just because she wanted to go to sleep she also admits that she is very hopeless.  She reports that she does not think she did this to hurt herself and is currently denying SI or HI.   HPI     Prior to Admission medications  Medication Sig Start Date End Date Taking? Authorizing Provider  amLODipine  (NORVASC ) 5 MG tablet Take 1 tablet (5 mg total) by mouth daily. 10/31/23   Jadapalle, Sree, MD  buPROPion  (WELLBUTRIN  XL) 150 MG 24 hr tablet Take 1 tablet (150 mg total) by mouth daily. 11/01/23   Donnelly Mellow, MD  hydrocortisone  cream 0.5 % Apply topically 2 (two) times daily. 10/31/23   Jadapalle, Sree, MD  levothyroxine  (SYNTHROID ) 75 MCG tablet Take 1 tablet (75 mcg total) by mouth daily at 6 (six) AM. 11/01/23   Jadapalle, Sree, MD  thiamine  (VITAMIN B-1) 100 MG tablet Take 1 tablet (100 mg total) by mouth daily. 11/01/23   Jadapalle, Sree, MD  Vitamins A & D (VITAMIN A & D) ointment Apply topically as needed (skin protectant). 10/31/23   Donnelly Mellow, MD    Allergies: Penicillins and Shellfish allergy    Review of Systems  Constitutional:  Positive for activity change.    Updated Vital Signs BP (!) 131/90 (BP Location: Left Arm)   Pulse 85   Temp 97.8 F (36.6 C) (Oral)   Resp 16   LMP 09/17/2019   SpO2 95%    Physical Exam Vitals and nursing note reviewed.  Constitutional:      General: She is not in acute distress.    Appearance: She is not toxic-appearing.     Comments: Mentation well, no sign of EtOH withdrawal   HENT:     Head: Normocephalic and atraumatic.  Eyes:     General: No scleral icterus.    Conjunctiva/sclera: Conjunctivae normal.  Cardiovascular:     Rate and Rhythm: Normal rate and regular rhythm.     Pulses: Normal pulses.     Heart sounds: Normal heart sounds.  Pulmonary:     Effort: Pulmonary effort is normal. No respiratory distress.     Breath sounds: Normal breath sounds.  Abdominal:     General: Abdomen is flat. Bowel sounds are normal.     Palpations: Abdomen is soft.     Tenderness: There is no abdominal tenderness.  Skin:    General: Skin is warm and dry.     Findings: No lesion.  Neurological:     General: No focal deficit present.     Mental Status: She is alert and oriented to person, place, and time. Mental status is at baseline.     (all labs ordered are listed, but only abnormal results are displayed) Labs Reviewed  COMPREHENSIVE METABOLIC PANEL WITH GFR - Abnormal; Notable for the  following components:      Result Value   CO2 19 (*)    Calcium 8.6 (*)    AST 49 (*)    All other components within normal limits  ETHANOL - Abnormal; Notable for the following components:   Alcohol, Ethyl (B) 276 (*)    All other components within normal limits  CBC WITH DIFFERENTIAL/PLATELET - Abnormal; Notable for the following components:   WBC 3.7 (*)    Hemoglobin 11.0 (*)    HCT 32.6 (*)    MCV 75.1 (*)    MCH 25.3 (*)    RDW 17.8 (*)    Neutro Abs 1.1 (*)    All other components within normal limits  SALICYLATE LEVEL - Abnormal; Notable for the following components:   Salicylate Lvl <7.0 (*)    All other components within normal limits  ACETAMINOPHEN  LEVEL - Abnormal; Notable for the following components:   Acetaminophen  (Tylenol ), Serum <10 (*)     All other components within normal limits  URINE DRUG SCREEN    EKG: EKG Interpretation Date/Time:  Wednesday January 02 2024 12:00:43 EST Ventricular Rate:  79 PR Interval:  167 QRS Duration:  94 QT Interval:  457 QTC Calculation: 524 R Axis:   0  Text Interpretation: Sinus rhythm Probable anterior infarct, old Prolonged QT interval Confirmed by Freddi Hamilton [phone]) on 01/02/2024 12:38:58 PM  Radiology: No results found.   Procedures   Medications Ordered in the ED - No data to display  Clinical Course as of 01/02/24 1429  Wed Jan 02, 2024  1140 4 hours Tylenol  level, 6 hours obs & back to baseline for obs.  [JB]  1143 10:45am took meds [JB]    Clinical Course User Index [JB] Timea Breed, Warren SAILOR, PA-C                                 Medical Decision Making Amount and/or Complexity of Data Reviewed Labs: ordered.   This patient presents to the ED for concern of OD, this involves an extensive number of treatment options, and is a complaint that carries with it a high risk of complications and morbidity.  The differential diagnosis includes overdose, suicide attempt   Co morbidities that complicate the patient evaluation  History of suicide attempt   Additional history obtained:  Additional history obtained from patient was seen 10/22/2023 for suicide attempt and needed to be admitted into the [name] for this   Lab Tests:  I personally interpreted labs.  The pertinent results include:   Patient's lab work is overall reassuring however she does have positive EtOH at 276 4-hour Tylenol  level is pending at time of signout    Cardiac Monitoring: / EKG:  The patient was maintained on a cardiac monitor.  I personally viewed and interpreted the cardiac monitored which showed an underlying rhythm of: sinus with QT prolongation    Problem List / ED Course / Critical interventions / Medication management  Patient comes in with overdose.  She reports that  she had intentional overdose but she was not trying to kill herself.  She does report that she feels down depressed and hopeless.  She has history of this in the past.  I am concerned that she is withholding information and did do this intentionally to harm herself, should IVC if trying to leave.  Will have her speak with behavioral health here.  She otherwise has no complaints.  Vital stable. Discussed with poison control who recommended obtaining basic labs as well as Tylenol  level and repeat Tylenol  level in 4 hours.  Observed for 6 hours postingestion. I have reviewed the patients home medicines and have made adjustments as needed. Patient has been overall stable and well-appearing throughout duration of stay in emergency room.  Patient was signed out to oncoming PA at shift change.  Patient should be medically cleared at around 4:45 PM if acetaminophen  level comes back reassuring and she continues to be stable.        Final diagnoses:  Overdose of undetermined intent, initial encounter    ED Discharge Orders     None          Shermon Warren SAILOR, PA-C 01/02/24 1521  "

## 2024-01-02 NOTE — Progress Notes (Addendum)
 Patient presented to the emergency department with complaints of ingesting 10 to 15 pills of hydroxyzine  25 mg pills.  Per chart review EMS contacted poison control this morning who recommended observe 6 hours for tachycardia and prolonged QTc.  QTc is 524.  Patient will need to be cleared by poison control prior to psychiatric evaluation. Please contact poison control with updated vital signs and QTc interval and document accordingly.  This provider sent a secure chat to Myranda Hinckly paramedic and [place], [place].

## 2024-01-02 NOTE — Progress Notes (Signed)
" °   01/02/24 2235  Charting Type  Charting Type Admission  Safety Check Verification  Has the RN verified the 15 minute safety check completion? Yes  Neurological  Neuro (WDL) WDL  HEENT  HEENT (WDL) X  R Eye Eyeglasses  L Eye Eyeglasses  Teeth Missing (Comment) (missing top/left tooth in the back)  Tongue Pink;Moist  Mucous Membrane(s) Moist;Pink  Voice Clear  Respiratory  Respiratory (WDL) WDL  Cardiac  Cardiac (WDL) X (HTN)  Vascular  Vascular (WDL) X (HTN)  Integumentary  Integumentary (WDL) X  Staff Member Assisting with Skin Assessment on Admission [place], MHT  Skin Color Appropriate for ethnicity  Skin Condition Dry;Flaky (dry/flaky feet)  Skin Integrity Intact;Other (Comment) (tattoo to left/inner wrist; discoloration (darker spot) to middle of back)  Skin Turgor Non-tenting  Braden Scale (Ages 8 and up)  Sensory Perceptions 4  Moisture 4  Activity 3  Mobility 4  Nutrition 3  Friction and Shear 3  Braden Scale Score 21  Musculoskeletal  Musculoskeletal (WDL) WDL  Assistive Device None  Gastrointestinal  Gastrointestinal (WDL) WDL  GU Assessment  Genitourinary (WDL) WDL  Genitalia  Female Genitalia Intact  Neurological  Level of Consciousness Alert    "

## 2024-01-02 NOTE — Progress Notes (Signed)
 This clinical research associate did not repeat an EKG on patient, for one occurrence, because it was already done earlier today at another facility.

## 2024-01-02 NOTE — Consult Note (Signed)
 [facility] Psychiatric Consult Initial  Patient Name: .Charlene [name]  MRN: [id number]  DOB: 12-11-1970  Consult Order details:  Orders (From admission, onward)     Start     Ordered   01/02/24 1650  IP CONSULT TO PSYCHIATRY       Ordering Provider: Daralene Lonni BIRCH, PA-C  Provider:  (Not yet assigned)  Question:  Reason for consult:  Answer:  Medication management   01/02/24 1649   01/02/24 1640  CONSULT TO CALL ACT TEAM       Ordering Provider: Daralene Lonni BIRCH, PA-C  Provider:  (Not yet assigned)  Question:  Reason for Consult?  Answer:  depression, OD   01/02/24 1639             Mode of Visit: In person    Psychiatry Consult Evaluation  Service Date: January 02, 2024 LOS:  LOS: 0 days  Chief Complaint intentional drug overdose on hydroxyzine  25 mg x 10 pills   Primary Psychiatric Diagnoses  Intentional drug overdose 2.  Bipolar disorder 3.  Alcohol use disorder  Assessment  Charlene [name] is a 53 y.o. female admitted: Presented to the ED on 01/02/2024 11:31 AM for intentional drug overdose on hydroxyzine  25 mg x 10 pills . She carries the psychiatric diagnoses of bipolar 1 disorder, alcohol abuse, depression, anxiety and suicide attempt by overdose and has a past medical history of hypothyroidism and, QTc prolongation, hypertension and obesity.   Her current presentation of depressive symptoms with intentional drug overdose is most consistent with MDD severe. She meets criteria for inpatient psychiatric treatment based on intentional drug overdose. Current outpatient psychotropic medications include Wellbutrin  XL 150 mg p.o. daily and historically she has had a positive response to these medications. She was compliant with medications prior to admission as evidenced by reporting medication compliance.   Please see plan below for detailed recommendations. Per Lonni Daralene, PA., patient is medically cleared and cleared by poison control today,  01/02/24 @1732 .  Diagnoses:  Active Hospital problems: Principal Problem:   Intentional drug overdose (HCC) Active Problems:   Alcohol use disorder   MDD (major depressive disorder), severe (HCC)    Plan   ## Psychiatric Medication Recommendations:  -Continue Wellbutrin  XL 150 mg p.o. daily for mood stabilization  -Added CIWA and Ativan  1 mg po every 6 hours as needed for CIWA greater than 10 for alcohol withdrawal symptoms  ## Medical Decision Making Capacity: Not specifically addressed in this encounter  ## Further Work-up:  -- add urine pregnancy While pt on Qtc prolonging medications, please monitor & replete K+ to 4 and Mg2+ to 2 -- most recent EKG on 01/02/24  had QtC of 524 -- Pertinent labwork reviewed earlier this admission includes: CMP, CBC, UDS, BAL and EKG   ## Disposition:-- We recommend inpatient psychiatric hospitalization when medically cleared. Patient is under voluntary admission status at this time; please IVC if attempts to leave hospital.  ## Behavioral / Environmental: - No specific recommendations at this time.     ## Safety and Observation Level:  - Based on my clinical evaluation, I estimate the patient to be at high risk of self harm in the current setting. - At this time, we recommend  1:1 Observation. This decision is based on my review of the chart including patient's history and current presentation, interview of the patient, mental status examination, and consideration of suicide risk including evaluating suicidal ideation, plan, intent, suicidal or self-harm behaviors, risk factors, and protective factors. This  judgment is based on our ability to directly address suicide risk, implement suicide prevention strategies, and develop a safety plan while the patient is in the clinical setting. Please contact our team if there is a concern that risk level has changed.  CSSR Risk Category:C-SSRS RISK CATEGORY: No Risk  Suicide Risk Assessment: Patient  has following modifiable risk factors for suicide: active mental illness (to encompass adhd, tbi, mania, psychosis, trauma reaction), current symptoms: anxiety/panic, insomnia, impulsivity, anhedonia, hopelessness, triggering events, and recent psychiatric hospitalization, which we are addressing by recommending inpatient psychiatric treatment. Patient has following non-modifiable or demographic risk factors for suicide: history of suicide attempt and psychiatric hospitalization Patient has the following protective factors against suicide: Access to outpatient mental health care  Thank you for this consult request. Recommendations have been communicated to the primary team.  We will continue to follow at this time.   Teresa Wyline CROME, NP       History of Present Illness  Relevant Aspects of Hospital ED Course: Admitted on 01/02/2024 for intentional drug overdose on hydroxyzine  25 mg x 10 pills.   Per Warren Shad, PA., EDP note. Charlene [name] is a 53 y.o. female with medical history significant of obesity, hypertension, alcohol abuse, depression presenting with intentional overdose, QT prolongation.  Patient reports ingesting roughly 40 to 50 pills of hydroxyzine  yesterday.  No regular alcohol intake.  Drinking roughly 3+ beers daily as well as intermittent hard liquor intake.  Patient states she has a baseline history of chronic depression.  Has been out of medication secondary to job loss for multiple months.  Denies any known history of HI/SI in the past.  No chest pain or shortness of breath.  Chills.  No abdominal pain.  Positive mild nausea.  Patient noted to have been found by her mother after the event at home. Presented to the ER afebrile, hemodynamically stable.  Satting well on room air.  Marquel Pottenger count 3.6, hemoglobin 10.8, platelets 123.  Creatinine 0.62.  Salicylate, Tylenol  level as well as drug screen all within normal limits.  Alcohol level 332.  Initial EKG with QTc in the 570s.  Case discussed with poison control with recommendation of keeping potassium greater than 4 and magnesium  greater than 2 in the setting of prolonged QT.   Patient Report:  On evaluation, patient is alert and oriented x 4. Her thought process is linear. However, she demonstrates poor insight and judgment regarding her current situation. Thought content is negative for active SI, HI, and AVH. Objectively, no signs of acute psychosis, including no paranoia or delusional ideations on exam. Her mood is depressed and affect is congruent. She has fair eye contact. She appears casually dressed. She is calm and cooperative and does not appear to be in acute distress.  Patient states that she took some pills and her family called EMS. When asked what did she ingest, she states her daughter's hydroxyzine , 25 mg each. She reports taking a total of 10 pills. She denies that she ingested the pills in a suicide attempt and stated that she just wanted to rest and go to sleep for a while. When asked what triggered the event, she states stress from her supervisor at work yelling and screaming at her. When asked which family member called the 911 she states her 91 year old daughter.   She denies suicidal thoughts. She denies self-harm behaviors. She reports 1 suicide attempt in October (2025) by overdosing on hydroxyzine .  She reports feeling depressed for the past couple months  and endorses depressive symptoms of sadness, excessive worry, isolating, guilt, crying spells, anhedonia, decreased energy, decreased motivation, feeling down, difficulty focusing and concentrating. She denies symptoms of mania or psychosis. She reports difficulty sleeping for a while and states that she just wanted to sleep this morning. She reports a poor appetite for the past couple months. No known recent weight loss.  She denies using illicit drugs. UDS negative. She reports drinking alcohol every day to every other day, on average 1-2 beers  and 1 to 2 glasses of wine.  She reports last consuming alcohol this morning. BAL on arrival 276. She reports drinking alcohol since she was 53 years old. She denies a history of alcohol withdrawal seizures or delirium tremens. She denies alcohol withdrawal symptoms. She denies a history of substance abuse rehabilitation.  She reports outpatient psychiatry and therapy with [facility]. She is currently prescribed Wellbutrin  150 mg daily. She reports a medical history of hypertension and hypothyroidism and is currently prescribed amlodipine  and levothyroxine .  She resides with her mother, and 2 kids ages 11 and 23 years old. She has 2 children who live outside of the home who are 67 and 51 years old. 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  Psych ROS:  Depression: Yes Anxiety:  Yes Mania (lifetime and current): History of bipolar  Psychosis: (lifetime and current): No    Review of Systems  Constitutional: Negative.   Respiratory: Negative.    Cardiovascular: Negative.   Gastrointestinal: Negative.   Neurological: Negative.   Psychiatric/Behavioral:  Positive for depression and substance abuse.      Psychiatric and Social History  Psychiatric History:  Information collected from the patient and EMR  Prev Dx/Sx: bipolar 1 disorder, alcohol abuse, depression, anxiety and suicide attempt by overdose  Current Psych Provider: Yes, [facility] Meds (current): Wellbutrin  XL 150 mg daily Therapy: Yes, current [facility]  Prior Psych Hospitalization: Yes, [facility] on 10/24/23 Prior Self Harm: No Prior Violence: No  Family Psych History: Sister and niece history of bipolar Family Hx suicide: No  Social History:  Occupational Hx: Employed as an Designer, [employer] Hx: No Living Situation: Resides with mother, and 2 children ages 17 to 67 years  Access to weapons/lethal means: No  Substance History Alcohol: Yes Type of alcohol beer and wine  Last Drink  This morning, 01/02/24 Number of drinks per day: couple of beers and 1-2 glasses of wine  History of alcohol withdrawal seizures No History of DT's: No Tobacco: No Illicit drugs: No Prescription drug abuse: No Rehab hx: No  Exam Findings  Physical Exam:  Vital Signs:  Temp:  [97.8 F (36.6 C)-98.1 F (36.7 C)] 98.1 F (36.7 C) (12/31 1626) Pulse Rate:  [82-85] 82 (12/31 1626) Resp:  [16-18] 18 (12/31 1626) BP: (131-135)/(90-92) 135/92 (12/31 1626) SpO2:  [95 %-100 %] 100 % (12/31 1626) Blood pressure (!) 135/92, pulse 82, temperature 98.1 F (36.7 C), temperature source Oral, resp. rate 18, last menstrual period 09/17/2019, SpO2 100%. There is no height or weight on file to calculate BMI.  Physical Exam Cardiovascular:     Rate and Rhythm: Normal rate.  Pulmonary:     Effort: Pulmonary effort is normal.  Musculoskeletal:        General: Normal range of motion.     Cervical back: Normal range of motion.  Neurological:     Mental Status: She is alert and oriented to person, place, and time.     Mental  Status Exam: General Appearance: Casual  Orientation:  Full (Time, Place, and Person)  Memory:  Immediate;   Fair Recent;   Good Remote;   Good  Concentration:  Concentration: Fair  Recall:  Fair  Attention  Fair  Eye Contact:  Fair  Speech:  Normal Rate  Language:  Fair  Volume:  Normal  Mood: Depressed   Affect:  Congruent  Thought Process:  Coherent  Thought Content:  Logical  Suicidal Thoughts:  No  Homicidal Thoughts:  No  Judgement:  Poor  Insight:  Poor   Psychomotor Activity:  Normal  Akathisia:  No  Fund of Knowledge:  Good      Assets:  Architect Housing  Cognition:  WNL  ADL's:  Intact  AIMS (if indicated):        Other History   These have been pulled in through the EMR, reviewed, and updated if appropriate.  Family History:  The patient's family history includes Heart disease in her father;  Hypertension in her father and mother.  Medical History: Past Medical History:  Diagnosis Date   Anemia    Hypertension     Surgical History: Past Surgical History:  Procedure Laterality Date   GASTRIC BYPASS  2000   HERNIA REPAIR  2004   ROBOTIC ASSISTED LAPAROSCOPIC HYSTERECTOMY AND SALPINGECTOMY Bilateral 11/17/2019   Procedure: XI ROBOTIC ASSISTED LAPAROSCOPIC HYSTERECTOMY AND BILATERAL SALPINGECTOMY;  Surgeon: Kandyce Sor, MD;  Location: [facility];  Service: Gynecology;  Laterality: Bilateral;   VENTRAL HERNIA REPAIR  2004     Medications:  Current Medications[1]  Allergies: Allergies[2]  Aparna Vanderweele L, NP     [1]  Current Facility-Administered Medications:    [START ON 01/03/2024] amLODipine  (NORVASC ) tablet 5 mg, 5 mg, Oral, Daily, Rigney, Lonni BIRCH, PA-C   [START ON 01/03/2024] buPROPion  (WELLBUTRIN  XL) 24 hr tablet 150 mg, 150 mg, Oral, Daily, Rigney, Christopher D, PA-C   [START ON 01/03/2024] levothyroxine  (SYNTHROID ) tablet 75 mcg, 75 mcg, Oral, Q0600, Rigney, Christopher D, PA-C   loperamide (IMODIUM) capsule 2-4 mg, 2-4 mg, Oral, PRN, Dariah Mcsorley L, NP   LORazepam  (ATIVAN ) tablet 1 mg, 1 mg, Oral, Q6H PRN, Djon Tith L, NP   multivitamin with minerals tablet 1 tablet, 1 tablet, Oral, Daily, Derl Abalos L, NP   thiamine  (VITAMIN B1) tablet 100 mg, 100 mg, Oral, Daily, Deroy Noah L, NP  Current Outpatient Medications:    amLODipine  (NORVASC ) 5 MG tablet, Take 1 tablet (5 mg total) by mouth daily., Disp: 30 tablet, Rfl: 0   buPROPion  (WELLBUTRIN  XL) 150 MG 24 hr tablet, Take 1 tablet (150 mg total) by mouth daily., Disp: 30 tablet, Rfl: 0   hydrocortisone  cream 0.5 %, Apply topically 2 (two) times daily., Disp: 30 g, Rfl: 0   levothyroxine  (SYNTHROID ) 75 MCG tablet, Take 1 tablet (75 mcg total) by mouth daily at 6 (six) AM., Disp: 30 tablet, Rfl: 0   thiamine  (VITAMIN B-1) 100 MG tablet, Take 1 tablet (100 mg total) by mouth  daily., Disp: 30 tablet, Rfl: 0   Vitamins A & D (VITAMIN A & D) ointment, Apply topically as needed (skin protectant)., Disp: 45 g, Rfl: 0 [2]  Allergies Allergen Reactions   Penicillins Hives and Shortness Of Breath    Heart rate increases   Shellfish Allergy Anaphylaxis    Swells up face Swells up face

## 2024-01-02 NOTE — Tx Team (Signed)
 Initial Treatment Plan 01/02/2024 10:35 PM Hazelle Woollard FMW:[id number]    PATIENT STRESSORS: Medication change or noncompliance   Substance abuse   Other: work-related stress     PATIENT STRENGTHS: Ability for insight  Arboriculturist fund of knowledge  Motivation for treatment/growth  Supportive family/friends  Work skills    PATIENT IDENTIFIED PROBLEMS: Insomnia  Depression  Anxiety  Alcohol use               DISCHARGE CRITERIA:  Ability to meet basic life and health needs Improved stabilization in mood, thinking, and/or behavior Need for constant or close observation no longer present Reduction of life-threatening or endangering symptoms to within safe limits  PRELIMINARY DISCHARGE PLAN: Outpatient therapy Return to previous living arrangement Return to previous work or school arrangements  PATIENT/FAMILY INVOLVEMENT: This treatment plan has been presented to and reviewed with the patient, Korina Tretter. The patient has been given the opportunity to ask questions and make suggestions.  Markeese Boyajian, RN 01/02/2024, 10:35 PM

## 2024-01-02 NOTE — ED Notes (Signed)
 Psych provider at bedside

## 2024-01-02 NOTE — Progress Notes (Signed)
" °   01/02/24 2200  Assessment Details  Time of Assessment Admission  Information Obtained From Patient  To be done on Admission  Risk Factors related to Demographics NA  Current Mental Status NA  Loss Factors NA  Historical Factors Family history of mental illness or substance abuse  Risk Reduction Factors NA  [place] Suicide Severity Rating Scale  1. In the past month -  Have you wished you were dead or wished you could go to sleep and not wake up? No  2. In the past month - Have you actually had any thoughts of killing yourself? No  6. Have you ever done anything, started to do anything, or prepared to do anything to end your life? No  C-SSRS RISK CATEGORY No Risk  [facility] Suicide Precaution Interventions  [facility] Suicide Bundle Interventions Low Risk Interventions implemented    "

## 2024-01-02 NOTE — Progress Notes (Signed)
" °   01/02/24 2242  Psych Admission Type (Psych Patients Only)  Admission Status Voluntary  Psychosocial Assessment  Patient Complaints Anxiety;Depression;Insomnia (patient states she has anxiety from work-related stress and her depression comes from lack of sleep and issues at work and now having to commute.)  [employer] Fair  Facial Expression Anxious  Affect Appropriate to circumstance  Speech Logical/coherent  Interaction Assertive  Motor Activity Slow  Appearance/Hygiene In scrubs  Behavior Characteristics Cooperative;Appropriate to situation  Mood Pleasant  Aggressive Behavior  Effect No apparent injury  Thought Process  Coherency WDL  Content WDL  Delusions None reported or observed  Perception WDL  Hallucination None reported or observed  Judgment WDL  Confusion None  Danger to Self  Current suicidal ideation? Denies  Danger to Others  Danger to Others None reported or observed    "

## 2024-01-02 NOTE — ED Notes (Signed)
 [facility] RN will call back for report, they are in middle of shift change.

## 2024-01-03 DIAGNOSIS — F329 Major depressive disorder, single episode, unspecified: Secondary | ICD-10-CM | POA: Diagnosis not present

## 2024-01-03 DIAGNOSIS — F1090 Alcohol use, unspecified, uncomplicated: Secondary | ICD-10-CM | POA: Diagnosis not present

## 2024-01-03 MED ORDER — CLONIDINE HCL 0.1 MG PO TABS
0.1000 mg | ORAL_TABLET | Freq: Once | ORAL | Status: AC
  Administered 2024-01-03: 0.1 mg via ORAL
  Filled 2024-01-03: qty 1

## 2024-01-03 NOTE — H&P (Signed)
 " History of Present Illness (HPI)  Charlene [name] is a 54 year old female with a history of bipolar 1 disorder, major depressive disorder (MDD), alcohol use disorder, and prior suicide attempt by overdose. She presented to the ED after ingesting approximately 10-15 tablets of hydroxyzine  25 mg (daughters prescription) and consuming alcohol (22 oz beer + several glasses of wine). She reports taking the medications to sleep and denies current suicidal intent, plan, or recent self-harm. She admits feeling hopeless and stressed due to work-related issues.  Past psychiatric history is significant for MDD, bipolar disorder, anxiety, alcohol use disorder, and prior suicide attempt by overdose. She reports outpatient psychiatric care with [facility] and compliance with Wellbutrin  XL 150 mg daily. She endorses ongoing depressive symptoms including sadness, anhedonia, poor appetite, low energy, difficulty concentrating, guilt, and sleep disturbance. She denies current mania, psychosis, SI, or HI.  She resides with her mother and two children (ages 46 and 28). She is employed as an product/process development scientist. She reports daily alcohol use, last consumed this morning. She denies illicit drug use.  Past Medical History  Hypertension  Hypothyroidism  Obesity  QTc prolongation  Anemia  Past Surgical History  Gastric bypass (2000)  Hernia repair (2004)  Robotic-assisted laparoscopic hysterectomy and salpingectomy (11/17/2019)  Ventral hernia repair (2004)  Current Medications  Outpatient: Wellbutrin  XL 150 mg daily, amlodipine  5 mg daily, levothyroxine  75 mcg daily, hydrocortisone  cream, thiamine , vitamins A & D. Inpatient/PRN: Ativan  1 mg Q6H PRN (CIWA >10), loperamide, multivitamins, thiamine , continuation of outpatient medications.  Allergies  Penicillin - hives, shortness of breath, tachycardia  Shellfish - anaphylaxis, facial swelling  Substance Use History  Alcohol: Daily to every other  day, beer and wine; BAL on admission 276 mg/dL  Tobacco: None  Illicit Drugs: None  Prescription Misuse: Hydroxyzine  overdose incident; no other history  Mental Status Examination Domain Findings General Appearance Casual, calm, cooperative Orientation Fully oriented to person, place, and time Speech Normal rate and volume, language fair Mood Depressed Affect Congruent with mood Thought Process Linear, coherent Thought Content No SI, HI, delusions, or hallucinations Judgment Poor Insight Poor Psychomotor Activity Normal Attention/Concentration Fair Memory Immediate: fair, Recent: good, Remote: good Eye [employer] of Knowledge Good Physical Exam  Vitals (01/02/24 16:26): Temp 98.87F, HR 82, BP 135/92, RR 18, SpO? 100% RA CV: Normal rate and rhythm Pulmonary: Normal effort, lungs clear Musculoskeletal: Normal ROM, no deficits Neurological: Alert, oriented, no focal deficits  Relevant ED Course: Patient medically stabilized. Poison Control contacted for hydroxyzine  overdose and QTc prolongation (initial QTc 570 ms, repeat 524 ms). Potassium and magnesium  monitored per recommendations. PHYSICAL EXAMINATION  General: Well-developed, well-nourished female, resting in bed, in no acute distress.  Cardiovascular: Regular rate and rhythm. Normal S1 and S2. No murmurs, rubs, or gallops appreciated. Peripheral pulses palpable and symmetric.  Respiratory: Lungs clear to auscultation bilaterally. No wheezes, rales, or rhonchi. Non-labored respirations.  Neurologic: Alert and oriented to person, place, and time. Cranial nerves II-XII grossly intact. Motor strength 5/5 in all extremities. Sensation grossly intact to light touch. No focal neurologic deficits observed. No tremor noted at rest.  Assessment  [name] is a 54 year old female with a history of bipolar disorder, major depressive disorder, and alcohol use disorder presenting after a hydroxyzine  overdose with  co-ingestion of alcohol. She denies active suicidal ideation or intent at this time. Depressive symptoms are consistent with MDD, severe. She is high risk for self-harm due to history of overdose, impulsivity, and ongoing depressive symptoms.  Principal Problem:  Intentional drug overdose Active Problems:  Major depressive disorder, severe  Alcohol use disorder  CSSR Risk Category: C-SSRS - No Risk reported (current SI denied)  Plan  1. Psychiatric Medication Management  Continue Wellbutrin  XL 150 mg daily  PRN Ativan  1 mg Q6H for CIWA >10  Monitor for alcohol withdrawal symptoms  2. Medical Monitoring  Monitor QTc and electrolytes (K? >4 mEq/L, Mg? >2 mEq/L)  EKG as indicated  Labs: CMP, CBC, UDS, BAL reviewed; urine pregnancy pending  3. Safety and Observation  1:1 observation recommended due to high-risk history  Monitor for new onset SI or self-harm behaviors  4. Psychosocial  Continue psychotherapy and outpatient psychiatric follow-up  Evaluate stressors related to work and family dynamics "

## 2024-01-03 NOTE — Group Note (Signed)
 Recreation Therapy Group Note   Group Topic:General Recreation  Group Date: 01/03/2024 Start Time: 1445 End Time: 1515 Facilitators: Celestia Jeoffrey BRAVO, LRT, CTRS Location: Courtyard  Group Description: [employer]. LRT and patients played games of basketball, drew with chalk, and played corn hole while outside in the courtyard while getting fresh air and sunlight. Music was being played in the background. LRT and peers conversed about different games they have played before, what they do in their free time and anything else that is on their minds. LRT encouraged pts to drink water after being outside, sweating and getting their heart rate up.  Goal Area(s) Addressed: Patient will build on frustration tolerance skills. Patients will partake in a competitive play game with peers. Patients will gain knowledge of new leisure interest/hobby.    Affect/Mood: Appropriate   Participation Level: Active and Engaged   Participation Quality: Independent   Behavior: Calm and Cooperative   Speech/Thought Process: Coherent   Insight: Good   Judgement: Good   Modes of Intervention: Activity and Music   Patient Response to Interventions:  Attentive and Receptive   Education Outcome:  Acknowledges education   Clinical Observations/Individualized Feedback: Charlene [name] was active in their participation of session activities and group discussion. Pt interacted well with LRT and peers duration of session.    Plan: Continue to engage patient in RT group sessions 2-3x/week.   Jeoffrey BRAVO Celestia, LRT, CTRS 01/03/2024 4:47 PM

## 2024-01-03 NOTE — Group Note (Signed)
 Date:  01/03/2024 Time:  10:46 AM  Group Topic/Focus:  MTH went over the rules and expectations on this unit for the patients, Identified who the nurses and techs were while also answering patient questions.    Participation Level:  Active  Participation Quality:  Appropriate  Affect:  Appropriate  Cognitive:  Appropriate  Insight: Appropriate  Engagement in Group:  Engaged  Modes of Intervention:  Discussion  Additional Comments:    Charlene [name] 01/03/2024, 10:46 AM

## 2024-01-03 NOTE — [facility] Suicide Risk Assessment (Signed)
 [facility] Admission Suicide Risk Assessment   Nursing information obtained from:  Patient Demographic factors:  NA Current Mental Status:  NA Loss Factors:  NA Historical Factors:  Family history of mental illness or substance abuse Risk Reduction Factors:  NA  Total Time spent with patient: 45 minutes Principal Problem: MDD (major depressive disorder), severe (HCC) Diagnosis:  Principal Problem:   MDD (major depressive disorder), severe (HCC)  Subjective Data: History of Present Illness (HPI)  Charlene [name] is a 54 year old female with a history of bipolar 1 disorder, major depressive disorder (MDD), alcohol use disorder, and prior suicide attempt by overdose. She presented to the ED after ingesting approximately 10-15 tablets of hydroxyzine  25 mg (daughters prescription) and consuming alcohol (22 oz beer + several glasses of wine). She reports taking the medications to sleep and denies current suicidal intent, plan, or recent self-harm. She admits feeling hopeless and stressed due to work-related issues.  Past psychiatric history is significant for MDD, bipolar disorder, anxiety, alcohol use disorder, and prior suicide attempt by overdose. She reports outpatient psychiatric care with [facility] and compliance with Wellbutrin  XL 150 mg daily. She endorses ongoing depressive symptoms including sadness, anhedonia, poor appetite, low energy, difficulty concentrating, guilt, and sleep disturbance. She denies current mania, psychosis, SI, or HI.  She resides with her mother and two children (ages 61 and 17). She is employed as an product/process development scientist. She reports daily alcohol use, last consumed this morning. She denies illicit drug use.  Continued Clinical Symptoms:  Alcohol Use Disorder Identification Test Final Score (AUDIT): 5 The Alcohol Use Disorders Identification Test, Guidelines for Use in Primary Care, Second Edition.  World Science Writer [facility]). Score between 0-7:  no or low risk or  alcohol related problems. Score between 8-15:  moderate risk of alcohol related problems. Score between 16-19:  high risk of alcohol related problems. Score 20 or above:  warrants further diagnostic evaluation for alcohol dependence and treatment.   CLINICAL FACTORS:   Depression:   Anhedonia   Musculoskeletal: Strength & Muscle Tone: within normal limits Gait & Station: normal Patient leans: N/A  Psychiatric Specialty Exam:  Presentation  General Appearance:  Appropriate for Environment; Casual  Eye Contact: Fair  Speech: Clear and Coherent  Speech Volume: Normal  Handedness: Right   Mood and Affect  Mood: Euthymic  Affect: Appropriate   Thought Process  Thought Processes: Coherent  Descriptions of Associations:Intact  Orientation:Full (Time, Place and Person)  Thought Content:Logical  History of Schizophrenia/Schizoaffective disorder:No data recorded Duration of Psychotic Symptoms:No data recorded Hallucinations:No data recorded Ideas of Reference:None  Suicidal Thoughts:No data recorded Homicidal Thoughts:No data recorded  Sensorium  Memory: Immediate Fair; Remote Fair  Judgment: Fair  Insight: Fair   Art Therapist  Concentration: Fair  Attention Span: Fair  Recall: [employer] of Knowledge: Fair  Language: Fair   Psychomotor Activity  Psychomotor Activity:No data recorded  Assets  Assets: Communication Skills; Desire for Improvement; Social Support   Sleep  Sleep:No data recorded   Physical Exam: Physical Exam ROS Blood pressure (!) 149/89, pulse 99, temperature 98.7 F (37.1 C), temperature source Oral, resp. rate 17, height 5' 10 (1.778 m), weight 126.6 kg, last menstrual period 09/17/2019, SpO2 98%. Body mass index is 40.03 kg/m.   COGNITIVE FEATURES THAT CONTRIBUTE TO RISK:  Closed-mindedness    SUICIDE RISK:   Mild:  Suicidal ideation of limited frequency, intensity, duration, and  specificity.  There are no identifiable plans, no associated intent, mild dysphoria and related symptoms,  good self-control (both objective and subjective assessment), few other risk factors, and identifiable protective factors, including available and accessible social support.  PLAN OF CARE: 1. Psychiatric Medication Management  Continue Wellbutrin  XL 150 mg daily  PRN Ativan  1 mg Q6H for CIWA >10  Monitor for alcohol withdrawal symptoms  2. Medical Monitoring  Monitor QTc and electrolytes (K? >4 mEq/L, Mg? >2 mEq/L)  EKG as indicated  Labs: CMP, CBC, UDS, BAL reviewed; urine pregnancy pending  3. Safety and Observation  1:1 observation recommended due to high-risk history  Monitor for new onset SI or self-harm behaviors  4. Psychosocial  Continue psychotherapy and outpatient psychiatric follow-up  Evaluate stressors related to work and family dynamics  I certify that inpatient services furnished can reasonably be expected to improve the patient's condition.   Millie JONELLE Manners, MD 01/03/2024, 12:07 PM

## 2024-01-03 NOTE — Plan of Care (Signed)
   Problem: Education: Goal: Emotional status will improve Outcome: Progressing Goal: Mental status will improve Outcome: Progressing

## 2024-01-03 NOTE — Group Note (Signed)
 [facility] LCSW Group Therapy Note   Group Date: 01/03/2024 Start Time: 1000 End Time: 1100   Type of Therapy/Topic:  Group Therapy:  Balance in Life  Participation Level:  Active   Description of Group:    This group will address the concept of balance and how it feels and looks when one is unbalanced. Patients will be encouraged to process areas in their lives that are out of balance, and identify reasons for remaining unbalanced. Facilitators will guide patients utilizing problem- solving interventions to address and correct the stressor making their life unbalanced. Understanding and applying boundaries will be explored and addressed for obtaining  and maintaining a balanced life. Patients will be encouraged to explore ways to assertively make their unbalanced needs known to significant others in their lives, using other group members and facilitator for support and feedback.  Therapeutic Goals: Patient will identify two or more emotions or situations they have that consume much of in their lives. Patient will identify signs/triggers that life has become out of balance:  Patient will identify two ways to set boundaries in order to achieve balance in their lives:  Patient will demonstrate ability to communicate their needs through discussion and/or role plays  Summary of Patient Progress:    Patients engaged in meaningful discussion around their New Years goals, plans, and expectations. Participants completed a reflective writing activity that invited them to explore something they hoped to overcome, release, gain clarity about, or further examine. They then shared with one another, offering thoughtful feedback, affirmations, validation, and a supportive space for exploration. The patient was open to feedback, actively engaged with others, and contributed to a safe and welcoming environment for peers to share. The patient remained a supportive presence throughout the group.     Therapeutic  Modalities:   Cognitive Behavioral Therapy Solution-Focused Therapy Assertiveness Training   Alveta CHRISTELLA Kerns, LCSW

## 2024-01-03 NOTE — Progress Notes (Signed)
" °   01/03/24 1035  Psych Admission Type (Psych Patients Only)  Admission Status Voluntary  Psychosocial Assessment  Patient Complaints Anxiety;Depression  Eye Contact Fair  Facial Expression Anxious  Affect Appropriate to circumstance  Speech Logical/coherent  Interaction Assertive  Motor Activity Slow  Appearance/Hygiene In scrubs  Behavior Characteristics Appropriate to situation;Cooperative  Mood Pleasant  Aggressive Behavior  Effect No apparent injury  Thought Process  Coherency WDL  Content WDL  Delusions None reported or observed  Perception WDL  Hallucination Auditory  Judgment WDL  Confusion None  Danger to Self  Current suicidal ideation? Denies  Danger to Others  Danger to Others None reported or observed    "

## 2024-01-03 NOTE — Group Note (Signed)
 Date:  01/03/2024 Time:  8:44 PM  Group Topic/Focus:  Conflict Resolution:   The focus of this group is to discuss the conflict resolution process and how it may be used upon discharge.    Participation Level:  Active  Participation Quality:  Appropriate  Affect:  Appropriate  Cognitive:  Appropriate  Insight: Appropriate  Engagement in Group:  Engaged  Modes of Intervention:  Discussion  Additional Comments:    Ekaterina Denise L 01/03/2024, 8:44 PM
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
## 2024-01-03 NOTE — Group Note (Signed)
 Recreation Therapy Group Note   Group Topic:Communication  Group Date: 01/03/2024 Start Time: 1410 End Time: 1445 Facilitators: Celestia Jeoffrey BRAVO, LRT, CTRS Location: Craft Room  Group Description: Resolution Charades. Each group member writes down a New Years resolution theyre considering (e.g., spend more time outdoors or practice mindfulness) onto a small slip of paper. Resolutions are folded and placed in a bowl. One person picks a resolution and acts it out without words while the group guesses. Once the resolution is guessed, the group discusses how achieving this goal could be meaningful.  Goal Area(s) Addressed:  [employer] and self-expression. Create a supportive space for sharing personal goals. Encourage reflection on individual aspirations. Nonverbal communication. Active listening, teamwork and team building.   Affect/Mood: Appropriate   Participation Level: Active and Engaged   Participation Quality: Independent   Behavior: Calm and Cooperative   Speech/Thought Process: Coherent   Insight: Good   Judgement: Good   Modes of Intervention: Cooperative Play, Socialization, and Support   Patient Response to Interventions:  Attentive, Engaged, and Receptive   Education Outcome:  Acknowledges education   Clinical Observations/Individualized Feedback: Charlene [name] was active in their participation of session activities and group discussion. Pt identified to spend more time with my grandkids as her resolution.   Plan: Continue to engage patient in RT group sessions 2-3x/week.   Jeoffrey BRAVO Celestia, LRT, CTRS 01/03/2024 4:00 PM

## 2024-01-03 NOTE — Group Note (Signed)
 Date:  01/03/2024 Time:  4:03 AM  Group Topic/Focus:  Goals Group:   The focus of this group is to help patients establish daily goals to achieve during treatment and discuss how the patient can incorporate goal setting into their daily lives to aide in recovery. Wrap-Up Group:   The focus of this group is to help patients review their daily goal of treatment and discuss progress on daily workbooks.    Pt was not admitted at time of group.  Charlene [name] 01/03/2024, 4:03 AM

## 2024-01-04 DIAGNOSIS — F322 Major depressive disorder, single episode, severe without psychotic features: Secondary | ICD-10-CM

## 2024-01-04 NOTE — Progress Notes (Signed)
 Pt calm and pleasant during assessment denying SI/HI/AVH. Pt stated that she was feeling a lot better tonight compared to when she came in. Patient observed by this clinical research associate interacting appropriately with staff and peers on the unit. Pt compliant with medication administration per MD orders.Pt given education, support, and encouragement to be active in her treatment plan. Pt being monitored Q 15 minutes for safety per unit protocol, remains safe on the unit

## 2024-01-04 NOTE — Group Note (Signed)
 Date:  01/04/2024 Time:  9:46 PM  Group Topic/Focus:  Coping With Mental Health Crisis:   The purpose of this group is to help patients identify strategies for coping with mental health crisis.  Group discusses possible causes of crisis and ways to manage them effectively. Managing Feelings:   The focus of this group is to identify what feelings patients have difficulty handling and develop a plan to handle them in a healthier way upon discharge.    Participation Level:  Active  Participation Quality:  Appropriate  Affect:  Appropriate  Cognitive:  Appropriate  Insight: Appropriate  Engagement in Group:  Engaged  Modes of Intervention:  Discussion and Education  Additional Comments:    Amie Cowens L 01/04/2024, 9:46 PM

## 2024-01-04 NOTE — Plan of Care (Signed)
 Charlene [name] is a 54 y.o. female patient. No diagnosis found. Past Medical History:  Diagnosis Date   Anemia    Hypertension    Current Facility-Administered Medications  Medication Dose Route Frequency Provider Last Rate Last Admin   acetaminophen  (TYLENOL ) tablet 650 mg  650 mg Oral Q6H PRN White, Patrice L, NP       amLODipine  (NORVASC ) tablet 5 mg  5 mg Oral Daily White, Patrice L, NP   5 mg at 01/03/24 1035   buPROPion  (WELLBUTRIN  XL) 24 hr tablet 150 mg  150 mg Oral Daily White, Patrice L, NP   150 mg at 01/03/24 1036   levothyroxine  (SYNTHROID ) tablet 75 mcg  75 mcg Oral Q0600 White, Patrice L, NP   75 mcg at 01/03/24 1036   loperamide (IMODIUM) capsule 2-4 mg  2-4 mg Oral PRN White, Patrice L, NP       LORazepam  (ATIVAN ) tablet 1 mg  1 mg Oral Q6H PRN White, Patrice L, NP       multivitamin with minerals tablet 1 tablet  1 tablet Oral Daily White, Patrice L, NP   1 tablet at 01/03/24 1036   thiamine  (VITAMIN B1) tablet 100 mg  100 mg Oral Daily White, Patrice L, NP   100 mg at 01/03/24 1035   Allergies[1] Principal Problem:   MDD (major depressive disorder), severe (HCC)  Blood pressure (!) 166/87, pulse 95, temperature 98.7 F (37.1 C), temperature source Oral, resp. rate 17, height 5' 10 (1.778 m), weight 126.6 kg, last menstrual period 09/17/2019, SpO2 100%.    Charlene [name] B Charlene [name] 01/04/2024      [1]  Allergies Allergen Reactions   Penicillins Hives and Shortness Of Breath    Heart rate increases   Shellfish Allergy Anaphylaxis    Swells up face Swells up face

## 2024-01-04 NOTE — Progress Notes (Signed)
" °   01/04/24 1500  Psych Admission Type (Psych Patients Only)  Admission Status Voluntary  Psychosocial Assessment  Patient Complaints Depression (patient rated depression a 1/10, reporting that it's much better than before)  Eye Contact Fair  Facial Expression Other (Comment) (appropriate)  Affect Appropriate to circumstance  Speech Logical/coherent  Interaction Assertive  Motor Activity Slow  Appearance/Hygiene In scrubs;Unremarkable  Behavior Characteristics Cooperative;Appropriate to situation  Mood Pleasant (patient states I'm good.)  Aggressive Behavior  Effect No apparent injury  Thought Process  Coherency WDL  Content WDL  Delusions None reported or observed  Perception WDL  Hallucination None reported or observed  Judgment WDL  Confusion None  Danger to Self  Current suicidal ideation? Denies  Agreement Not to Harm Self Yes  Description of Agreement Verbal  Danger to Others  Danger to Others None reported or observed    "

## 2024-01-04 NOTE — Group Note (Signed)
 Recreation Therapy Group Note   Group Topic:Leisure Education  Group Date: 01/04/2024 Start Time: 1020 End Time: 1115 Facilitators: Celestia Jeoffrey BRAVO, LRT, CTRS Location: Craft Room  Group Description: Leisure. Patients were given the option to choose from journaling, coloring, drawing, making origami, playing with playdoh, listening to music or singing karaoke. LRT and pts discussed the meaning of leisure, the importance of participating in leisure during their free time/when they're outside of the hospital, as well as how our leisure interests can also serve as coping skills.   Goal Area(s) Addressed:  Patient will identify a current leisure interest.  Patient will learn the definition of leisure. Patient will practice making a positive decision. Patient will have the opportunity to try a new leisure activity. Patient will communicate with peers and LRT.    Affect/Mood: Appropriate   Participation Level: Active and Engaged   Participation Quality: Independent   Behavior: Calm and Cooperative   Speech/Thought Process: Coherent   Insight: Good   Judgement: Good   Modes of Intervention: Clarification, Education, Exploration, and Music   Patient Response to Interventions:  Attentive, Engaged, Interested , and Receptive   Education Outcome:  Acknowledges education   Clinical Observations/Individualized Feedback: Jamylah was active in their participation of session activities and group discussion. Pt identified watch movies and read as things she does in her free time.    Plan: Continue to engage patient in RT group sessions 2-3x/week.   Jeoffrey BRAVO Celestia, LRT, CTRS 01/04/2024 11:41 AM

## 2024-01-04 NOTE — Progress Notes (Signed)
" °   01/04/24 0406  Psych Admission Type (Psych Patients Only)  Admission Status Voluntary  Psychosocial Assessment  Patient Complaints Anxiety  Eye Contact Fair;Brief  Facial Expression Anxious  Affect Appropriate to circumstance  Speech Logical/coherent  Interaction Assertive  Motor Activity Slow  Appearance/Hygiene In scrubs  Behavior Characteristics Appropriate to situation;Cooperative  Mood Pleasant  Thought Process  Coherency WDL  Content WDL  Delusions None reported or observed  Perception WDL  Hallucination None reported or observed  Judgment Impaired  Confusion None  Danger to Self  Current suicidal ideation? Denies  Agreement Not to Harm Self Yes  Description of Agreement Verbal  Danger to Others  Danger to Others None reported or observed    "

## 2024-01-04 NOTE — Group Note (Signed)
 Date:  01/04/2024 Time:  6:58 PM  Group Topic/Focus:  Wellness Toolbox:   The focus of this group is to discuss various aspects of wellness, balancing those aspects and exploring ways to increase the ability to experience wellness.  Patients will create a wellness toolbox for use upon discharge.    Participation Level:  Did Not Attend   Charlene [name] Charlene [name] 01/04/2024, 6:58 PM

## 2024-01-04 NOTE — BH IP Treatment Plan (Signed)
 Interdisciplinary Treatment and Diagnostic Plan Update  01/04/2024 Time of Session: 10:06 Charlene [name] MRN: [id number]  Principal Diagnosis: MDD (major depressive disorder), severe (HCC)  Secondary Diagnoses: Principal Problem:   MDD (major depressive disorder), severe (HCC)   Current Medications:  Current Facility-Administered Medications  Medication Dose Route Frequency Provider Last Rate Last Admin   acetaminophen  (TYLENOL ) tablet 650 mg  650 mg Oral Q6H PRN White, Patrice L, NP       amLODipine  (NORVASC ) tablet 5 mg  5 mg Oral Daily White, Patrice L, NP   5 mg at 01/04/24 0856   buPROPion  (WELLBUTRIN  XL) 24 hr tablet 150 mg  150 mg Oral Daily White, Patrice L, NP   150 mg at 01/04/24 9144   levothyroxine  (SYNTHROID ) tablet 75 mcg  75 mcg Oral Q0600 Teresa Jes L, NP   75 mcg at 01/04/24 9374   loperamide (IMODIUM) capsule 2-4 mg  2-4 mg Oral PRN White, Patrice L, NP       LORazepam  (ATIVAN ) tablet 1 mg  1 mg Oral Q6H PRN White, Patrice L, NP       multivitamin with minerals tablet 1 tablet  1 tablet Oral Daily White, Patrice L, NP   1 tablet at 01/04/24 0855   thiamine  (VITAMIN B1) tablet 100 mg  100 mg Oral Daily White, Patrice L, NP   100 mg at 01/04/24 9144   PTA Medications: Medications Prior to Admission  Medication Sig Dispense Refill Last Dose/Taking   amLODipine  (NORVASC ) 5 MG tablet Take 1 tablet (5 mg total) by mouth daily. 30 tablet 0 Past Month   buPROPion  (WELLBUTRIN  XL) 150 MG 24 hr tablet Take 1 tablet (150 mg total) by mouth daily. 30 tablet 0 Past Week   hydrocortisone  cream 0.5 % Apply topically 2 (two) times daily. 30 g 0 Unknown   levothyroxine  (SYNTHROID ) 75 MCG tablet Take 1 tablet (75 mcg total) by mouth daily at 6 (six) AM. 30 tablet 0 Past Month   thiamine  (VITAMIN B-1) 100 MG tablet Take 1 tablet (100 mg total) by mouth daily. 30 tablet 0 Unknown   Vitamins A & D (VITAMIN A & D) ointment Apply topically as needed (skin protectant). 45 g 0 Unknown     Patient Stressors: Medication change or noncompliance   Substance abuse   Other: work-related stress    Patient Strengths: Ability for insight  Arboriculturist fund of knowledge  Motivation for treatment/growth  Supportive family/friends  Work skills   Treatment Modalities: Medication Management, Group therapy, Case management,  1 to 1 session with clinician, Psychoeducation, Recreational therapy.   Physician Treatment Plan for Primary Diagnosis: MDD (major depressive disorder), severe (HCC) Long Term Goal(s):     Short Term Goals:    Medication Management: Evaluate patient's response, side effects, and tolerance of medication regimen.  Therapeutic Interventions: 1 to 1 sessions, Unit Group sessions and Medication administration.  Evaluation of Outcomes: Progressing  Physician Treatment Plan for Secondary Diagnosis: Principal Problem:   MDD (major depressive disorder), severe (HCC)  Long Term Goal(s):     Short Term Goals:       Medication Management: Evaluate patient's response, side effects, and tolerance of medication regimen.  Therapeutic Interventions: 1 to 1 sessions, Unit Group sessions and Medication administration.  Evaluation of Outcomes: Progressing   RN Treatment Plan for Primary Diagnosis: MDD (major depressive disorder), severe (HCC) Long Term Goal(s): Knowledge of disease and therapeutic regimen to maintain health will improve  Short  Term Goals: Ability to remain free from injury will improve, Ability to verbalize frustration and anger appropriately will improve, Ability to demonstrate self-control, Ability to participate in decision making will improve, Ability to verbalize feelings will improve, Ability to disclose and discuss suicidal ideas, Ability to identify and develop effective coping behaviors will improve, and Compliance with prescribed medications will improve  Medication Management: RN will administer  medications as ordered by provider, will assess and evaluate patient's response and provide education to patient for prescribed medication. RN will report any adverse and/or side effects to prescribing provider.  Therapeutic Interventions: 1 on 1 counseling sessions, Psychoeducation, Medication administration, Evaluate responses to treatment, Monitor vital signs and CBGs as ordered, Perform/monitor CIWA, COWS, AIMS and Fall Risk screenings as ordered, Perform wound care treatments as ordered.  Evaluation of Outcomes: Progressing   LCSW Treatment Plan for Primary Diagnosis: MDD (major depressive disorder), severe (HCC) Long Term Goal(s): Safe transition to appropriate next level of care at discharge, Engage patient in therapeutic group addressing interpersonal concerns.  Short Term Goals: Engage patient in aftercare planning with referrals and resources, Increase social support, Increase ability to appropriately verbalize feelings, Increase emotional regulation, Facilitate acceptance of mental health diagnosis and concerns, Identify triggers associated with mental health/substance abuse issues, and Increase skills for wellness and recovery  Therapeutic Interventions: Assess for all discharge needs, 1 to 1 time with Social worker, Explore available resources and support systems, Assess for adequacy in community support network, Educate family and significant other(s) on suicide prevention, Complete Psychosocial Assessment, Interpersonal group therapy.  Evaluation of Outcomes: Progressing   Progress in Treatment: Attending groups: Yes. Participating in groups: Yes. Taking medication as prescribed: Yes. Toleration medication: Yes. Family/Significant other contact made: No, will contact:  when given permission.  Patient understands diagnosis: Yes. Discussing patient identified problems/goals with staff: Yes. Medical problems stabilized or resolved: Yes. Denies suicidal/homicidal ideation:  Yes. Issues/concerns per patient self-inventory: No. Other: none.   New problem(s) identified: No, Describe:  none identified  New Short Term/Long Term Goal(s): medication management for mood stabilization; elimination of SI thoughts; development of comprehensive mental wellness plan.  Patient Goals:  Make sure I start my therapy and psychiatry back.   Discharge Plan or Barriers: CSW will assist pt with development of an appropriate aftercare/discharge plan.   Reason for Continuation of Hospitalization: Depression Medication stabilization Suicidal ideation  Estimated Length of Stay: 1-7 days  Last 3 [place] Suicide Severity Risk Score: Flowsheet Row Admission (Current) from 01/02/2024 in [facility] INPATIENT BEHAVIORAL MEDICINE Most recent reading at 01/02/2024 10:00 PM ED from 01/02/2024 in [facility] Emergency Department at [facility] Most recent reading at 01/02/2024 11:43 AM Admission (Discharged) from 10/24/2023 in [facility] INPATIENT BEHAVIORAL MEDICINE Most recent reading at 10/24/2023  6:00 PM  C-SSRS RISK CATEGORY No Risk No Risk High Risk    Last PHQ 2/9 Scores:     No data to display          Scribe for Treatment Team: Nadara JONELLE Fam, LCSW 01/04/2024 12:58 PM

## 2024-01-04 NOTE — Progress Notes (Signed)
 [facility] MD Progress Note  01/04/2024 6:03 PM Charlene [name]  MRN:  [id number]   Subjective:  Chart reviewed, case discussed in multidisciplinary meeting, patient seen during rounds.   patient denied current suicidal ideation and denied homicidal ideation at this time. She also denied experiencing auditory hallucinations or visual hallucinations. The patient reported tolerating her current medication regimen, including Wellbutrin , well and denied any subjective medication side effects.  Of note, the patient has demonstrated elevated blood pressure readings during this hospitalization. When questioned about potential symptoms associated with hypertension, the patient denied chest pain, dizziness, headache, or shortness of breath. The patient reported that she had been off her prescribed antihypertensive medications for several days prior to being brought to the hospital, which likely accounts for the currently elevated blood pressure readings. Blood pressure monitoring will continue,Patient's home medications have been restarted.    The patient expressed strong desire to be discharged from the inpatient psychiatric unit today. She reported that she was not taking the hydroxyzine  intentionally in a suicide attempt, but rather was attempting to use this medication to help her sleep. She acknowledged taking 5 to 6 tablets of hydroxyzine  for this purpose. Chart review confirms that these medications were not the patient's own prescribed medications, but rather belonged to her daughter.   The patient reported that she currently resides with her mother as well as her children. She granted permission for the psychiatry team to contact her mother to obtain collateral information regarding her baseline functioning, recent behaviors, safety concerns, and available support systems in the home. This collateral contact is essential to inform discharge planning and assess the safety of the discharge environment.  The  patient expressed continued desire to leave the hospital and voiced frustration regarding the inability to be discharged immediately. An extensive discussion was conducted with the patient regarding the clinical necessity of completing appropriate safety assessments and discharge planning before she can be safely transitioned to a lower level of care. Specifically, the treatment team explained the following:  Need for Safety Collateral: Given the circumstances of her admission involving ingestion of multiple tablets of medication not prescribed to her, the treatment team has a professional and ethical responsibility to obtain collateral information from family members who reside with the patient. This collateral contact allows the team to verify the patient's self-report, assess her baseline level of functioning, identify any concerning behaviors or statements that may not have been disclosed during clinical interviews, and determine whether family members have safety concerns about her return home. Collateral information from the patient's mother will provide critical context that informs the risk assessment and discharge safety planning.  Ensuring Appropriate Community Resources: Before discharge can occur, the treatment team must verify that the patient has adequate outpatient psychiatric follow-up appointments scheduled, confirm that she has a safe and stable living environment to which she can be discharged, ensure that she has access to prescribed medications and understands proper medication administration, and confirm that family or other support persons are available to monitor her well-being and assist with treatment adherence.   Verification of Follow-Up Care: The patient reported that she has established follow-up appointments scheduled, including an appointment with her psychiatrist through [facility] on January 08, 2024 at 11:30 AM, and an appointment with her therapist through [facility]  on January 09, 2024 at 2:40 PM. These appointments represent appropriate outpatient psychiatric and therapeutic support.   The patient verbalized understanding of the rationale for these necessary discharge planning steps, though she remained frustrated with the  timeline. She was reassured that the team is working expeditiously to complete the required safety assessments and care coordination so that she can be discharged as soon as it is safe and clinically appropriate to do so.  The above information was relayed to the social work team during treatment team meeting.   Despite her expressed frustration regarding the discharge timeline, the patient remained calm and cooperative with the psychiatry team throughout today's evaluation. She engaged appropriately in the discussion regarding discharge planning requirements and did not demonstrate behavioral dyscontrol or verbal aggression. This cooperative engagement is a positive indicator for treatment alliance and suggests the patient is capable of participating constructively in discharge planning.  The patient will continue to be monitored on the inpatient psychiatric unit while collateral information is obtained and discharge planning is finalized. Once safety collateral has been completed, outpatient appointments have been verified, and the treatment team is confident that appropriate community resources and supports are in place, the patient will be reassessed for discharge readiness. Daily psychiatric evaluations will continue, and the patient will be kept informed of progress toward discharge.  Past Psychiatric History: see h&P Family History:  Family History  Problem Relation Age of Onset   Hypertension Mother    Heart disease Father    Hypertension Father    Social History:  Social History   Substance and Sexual Activity  Alcohol Use Yes   Comment: patient reports drinking 3-4 shots of vodka or 2-3 glasses of wine most days     Social  History   Substance and Sexual Activity  Drug Use No    Social History   Socioeconomic History   Marital status: Divorced    Spouse name: Not on file   Number of children: Not on file   Years of education: Not on file   Highest education level: Not on file  Occupational History   Not on file  Tobacco Use   Smoking status: Never   Smokeless tobacco: Never  Vaping Use   Vaping status: Never Used  Substance and Sexual Activity   Alcohol use: Yes    Comment: patient reports drinking 3-4 shots of vodka or 2-3 glasses of wine most days   Drug use: No   Sexual activity: Yes    Birth control/protection: I.U.D.  Other Topics Concern   Not on file  Social History Narrative   Not on file   Social Drivers of Health   Tobacco Use: Low Risk (01/02/2024)   Patient History    Smoking Tobacco Use: Never    Smokeless Tobacco Use: Never    Passive Exposure: Not on file  Financial Resource Strain: Not on file  Food Insecurity: No Food Insecurity (01/02/2024)   Epic    Worried About Programme Researcher, Broadcasting/film/video in the Last Year: Never true    Ran Out of Food in the Last Year: Never true  Transportation Needs: No Transportation Needs (01/02/2024)   Epic    Lack of Transportation (Medical): No    Lack of Transportation (Non-Medical): No  Physical Activity: Not on file  Stress: Not on file  Social Connections: Unknown (05/13/2021)   Received from [facility]   Social Network    Social Network: Not on file  Depression [phone]): Not on file  Alcohol Screen: Low Risk (01/02/2024)   Alcohol Screen    Last Alcohol Screening Score (AUDIT): 5  Recent Concern: Alcohol Screen - High Risk (10/24/2023)   Alcohol Screen    Last Alcohol Screening Score (  AUDIT): 20  Housing: Low Risk (01/02/2024)   Epic    Unable to Pay for Housing in the Last Year: No    Number of Times Moved in the Last Year: 0    Homeless in the Last Year: No  Utilities: Not At Risk (01/02/2024)   Epic    Threatened with loss  of utilities: No  Health Literacy: Not on file   Past Medical History:  Past Medical History:  Diagnosis Date   Anemia    Hypertension     Past Surgical History:  Procedure Laterality Date   GASTRIC BYPASS  2000   HERNIA REPAIR  2004   ROBOTIC ASSISTED LAPAROSCOPIC HYSTERECTOMY AND SALPINGECTOMY Bilateral 11/17/2019   Procedure: XI ROBOTIC ASSISTED LAPAROSCOPIC HYSTERECTOMY AND BILATERAL SALPINGECTOMY;  Surgeon: Kandyce Sor, MD;  Location: [facility] ;  Service: Gynecology;  Laterality: Bilateral;   VENTRAL HERNIA REPAIR  2004    Current Medications: Current Facility-Administered Medications  Medication Dose Route Frequency Provider Last Rate Last Admin   acetaminophen  (TYLENOL ) tablet 650 mg  650 mg Oral Q6H PRN White, Patrice L, NP       amLODipine  (NORVASC ) tablet 5 mg  5 mg Oral Daily White, Patrice L, NP   5 mg at 01/04/24 0856   buPROPion  (WELLBUTRIN  XL) 24 hr tablet 150 mg  150 mg Oral Daily White, Patrice L, NP   150 mg at 01/04/24 9144   levothyroxine  (SYNTHROID ) tablet 75 mcg  75 mcg Oral Q0600 White, Patrice L, NP   75 mcg at 01/04/24 9374   loperamide (IMODIUM) capsule 2-4 mg  2-4 mg Oral PRN White, Patrice L, NP       LORazepam  (ATIVAN ) tablet 1 mg  1 mg Oral Q6H PRN White, Patrice L, NP       multivitamin with minerals tablet 1 tablet  1 tablet Oral Daily White, Patrice L, NP   1 tablet at 01/04/24 0855   thiamine  (VITAMIN B1) tablet 100 mg  100 mg Oral Daily White, Patrice L, NP   100 mg at 01/04/24 9144    Lab Results:  No results found for this or any previous visit (from the past 48 hours).   Blood Alcohol level:  Lab Results  Component Value Date   ETH 276 (H) 01/02/2024   ETH 332 (HH) 10/22/2023    Metabolic Disorder Labs: Lab Results  Component Value Date   HGBA1C 4.6 (L) 10/28/2023   MPG 85.32 10/28/2023   No results found for: PROLACTIN Lab Results  Component Value Date   CHOL 135 10/28/2023   TRIG 53 10/28/2023   HDL 52  10/28/2023   CHOLHDL 2.6 10/28/2023   VLDL 11 10/28/2023   LDLCALC 72 10/28/2023    Physical Findings: AIMS:  , ,  ,  ,    CIWA:  CIWA-Ar Total: 0 COWS:      Psychiatric Specialty Exam:  Presentation  General Appearance:  Appropriate for Environment; Casual  Eye Contact: Fair  Speech: Clear and Coherent  Speech Volume: Normal    Mood and Affect  Mood: Euthymic  Affect: Appropriate   Thought Process  Thought Processes: Coherent  Orientation:Full (Time, Place and Person)  Thought Content:Logical  Hallucinations:No data recorded Ideas of Reference:None  Suicidal Thoughts:No data recorded Homicidal Thoughts:No data recorded  Sensorium  Memory: Immediate Fair; Remote Fair  Judgment: Fair  Insight: Fair   Chartered Certified Accountant: Fair  Attention Span: Fair  Recall: [employer] of Knowledge: Fair  Language:  Fair   Psychomotor Activity  Psychomotor Activity:No data recorded Musculoskeletal: Strength & Muscle Tone: within normal limits Gait & Station: normal Assets  Assets: Manufacturing Systems Engineer; Desire for Improvement; Social Support    Physical Exam: Physical Exam Pulmonary:     Effort: Pulmonary effort is normal.  Neurological:     Mental Status: She is alert and oriented to person, place, and time.    ROS Blood pressure (!) 153/78, pulse 87, temperature 98.2 F (36.8 C), temperature source Oral, resp. rate 18, height 5' 10 (1.778 m), weight 126.6 kg, last menstrual period 09/17/2019, SpO2 100%. Body mass index is 40.03 kg/m.  Diagnosis: Principal Problem:   MDD (major depressive disorder), severe (HCC)   PLAN: Safety and Monitoring:  -- Voluntary admission to inpatient psychiatric unit for safety, stabilization and treatment  -- Daily contact with patient to assess and evaluate symptoms and progress in treatment  -- Patient's case to be discussed in multi-disciplinary team meeting  -- Observation  Level : q15 minute checks  -- Vital signs:  q12 hours  -- Precautions: suicide, elopement, and assault -- Encouraged patient to participate in unit milieu and in scheduled group therapies  2. Psychiatric Treatment:  Scheduled Medications: - Patient declined any further medication management at this time and wished to continue with her scheduled Wellbutrin .  Patient does not meet criteria for nonemergent forced medications at this time.  Patient wishes to work with her outpatient psychiatrist for further medication management. -continue home medication wellbutrin  150 mg daily    -- The risks/benefits/side-effects/alternatives to this medication were discussed in detail with the patient and time was given for questions. The patient consents to medication trial.  3. Medical Issues Being Addressed:  -Elevated blood pressure readings noted, but patient currently noted to be asymptomatic.  Patient did report being off blood pressure medications prior to hospital arrival.  Psychiatry team will continue to monitor. -continue prescribed home medications   4. Discharge Planning:   -- Social work and case management to assist with discharge planning and identification of hospital follow-up needs prior to discharge  -- Estimated LOS: 3-4 days  Zelda Sharps, NP This note was created using [employer]. Please excuse any inadvertent transcription errors. Case was discussed with supervising physician Dr. Ruther who is agreeable with current plan.

## 2024-01-04 NOTE — Group Note (Signed)
 Date:  01/04/2024 Time:  10:12 AM  Group Topic/Focus:  Goals Group:   The focus of this group is to help patients establish daily goals to achieve during treatment and discuss how the patient can incorporate goal setting into their daily lives to aide in recovery.    Participation Level:  Did Not Attend   Charlene [name] June 01/04/2024, 10:12 AM

## 2024-01-04 NOTE — Group Note (Signed)
 Recreation Therapy Group Note   Group Topic:Stress Management  Group Date: 01/04/2024 Start Time: 1530 End Time: 1630 Facilitators: Celestia Jeoffrey BRAVO, LRT, CTRS Location: Dayroom  Group Description: Rummy. LRT and pts played games of Rummy. LRT prompted group discussion on the physical signs and symptoms of stress, like those you feel when playing a competitive card game. LRT and pt discussed the physical and mental signs of stress, as well as coping skills to manage them.   Goal Area(s) Addressed: Patient will identify physical symptoms of stress. Patient will identify coping skills for stress. Patient will build frustration tolerance skills.  Patient will increase communication.    Affect/Mood: Appropriate   Participation Level: Active and Engaged   Participation Quality: Independent   Behavior: Appropriate, Calm, and Cooperative   Speech/Thought Process: Coherent   Insight: Good   Judgement: Good   Modes of Intervention: Competitive Play   Patient Response to Interventions:  Attentive, Engaged, Interested , and Receptive   Education Outcome:  Acknowledges education   Clinical Observations/Individualized Feedback: Charlene [name] was active in their participation of session activities and group discussion. Pt interacted well with LRT and peers duration of session.    Plan: Continue to engage patient in RT group sessions 2-3x/week.   Jeoffrey BRAVO Celestia, LRT, CTRS 01/04/2024 5:20 PM

## 2024-01-04 NOTE — Plan of Care (Signed)
" °  Problem: Education: Goal: Knowledge of [facility] Education information/materials will improve Outcome: Progressing Goal: Emotional status will improve Outcome: Progressing Goal: Mental status will improve Outcome: Progressing Goal: Verbalization of understanding the information provided will improve Outcome: Progressing   Problem: Activity: Goal: Interest or engagement in activities will improve Outcome: Progressing Goal: Sleeping patterns will improve Outcome: Progressing   Problem: Coping: Goal: Ability to verbalize frustrations and anger appropriately will improve Outcome: Progressing Goal: Ability to demonstrate self-control will improve Outcome: Progressing   Problem: Health Behavior/Discharge Planning: Goal: Identification of resources available to assist in meeting health care needs will improve Outcome: Progressing Goal: Compliance with treatment plan for underlying cause of condition will improve Outcome: Progressing   Problem: Physical Regulation: Goal: Ability to maintain clinical measurements within normal limits will improve Outcome: Progressing   Problem: Safety: Goal: Periods of time without injury will increase Outcome: Progressing   Problem: Education: Goal: Knowledge of [facility] Education information/materials will improve Outcome: Progressing Goal: Emotional status will improve Outcome: Progressing Goal: Mental status will improve Outcome: Progressing Goal: Verbalization of understanding the information provided will improve Outcome: Progressing   Problem: Safety: Goal: Periods of time without injury will increase Outcome: Progressing   Problem: Coping: Goal: Coping ability will improve Outcome: Progressing Goal: Will verbalize feelings Outcome: Progressing   Problem: Self-Concept: Goal: Level of anxiety will decrease Outcome: Progressing   Problem: Education: Goal: Ability to make informed decisions regarding  treatment will improve Outcome: Progressing   Problem: Coping: Goal: Coping ability will improve Outcome: Progressing   "

## 2024-01-05 DIAGNOSIS — F329 Major depressive disorder, single episode, unspecified: Secondary | ICD-10-CM | POA: Diagnosis not present

## 2024-01-05 LAB — TSH: TSH: 8.17 u[IU]/mL — ABNORMAL HIGH (ref 0.350–4.500)

## 2024-01-05 MED ORDER — FLUOXETINE HCL 10 MG PO CAPS
10.0000 mg | ORAL_CAPSULE | Freq: Every day | ORAL | Status: DC
  Administered 2024-01-06 – 2024-01-08 (×3): 10 mg via ORAL
  Filled 2024-01-05 (×3): qty 1

## 2024-01-05 NOTE — Group Note (Signed)
 Date:  01/05/2024 Time:  10:12 PM  Group Topic/Focus:  Wrap-Up Group:   The focus of this group is to help patients review their daily goal of treatment and discuss progress on daily workbooks.    Participation Level:  Active  Participation Quality:  Appropriate, Attentive, Sharing, and Supportive  Affect:  Appropriate  Cognitive:  Appropriate  Insight: Appropriate and Good  Engagement in Group:  Engaged  Modes of Intervention:  Discussion  Additional Comments:     Kerri Katz 01/05/2024, 10:12 PM

## 2024-01-05 NOTE — Plan of Care (Signed)
   Problem: Education: Goal: Emotional status will improve Outcome: Progressing Goal: Mental status will improve Outcome: Progressing

## 2024-01-05 NOTE — Group Note (Signed)
 Date:  01/05/2024 Time:  2:06 PM  Group Topic/Focus:  Goals Group:   The focus of this group is to help patients establish daily goals to achieve during treatment and discuss how the patient can incorporate goal setting into their daily lives to aide in recovery.    Participation Level:  Active  Participation Quality:  Appropriate  Affect:  Appropriate  Cognitive:  Appropriate  Insight: Appropriate  Engagement in Group:  Engaged  Modes of Intervention:  Activity  Additional Comments:    Charlene [name] Charlene [name] Name 01/05/2024, 2:06 PM

## 2024-01-05 NOTE — Progress Notes (Signed)
" °   01/05/24 0825  Psych Admission Type (Psych Patients Only)  Admission Status Voluntary  Psychosocial Assessment  Patient Complaints Depression  Eye Contact Fair  Facial Expression Anxious  Affect Appropriate to circumstance  Speech Logical/coherent  Interaction Assertive  Motor Activity Slow  Appearance/Hygiene Unremarkable;In scrubs  Behavior Characteristics Cooperative;Appropriate to situation  Mood Pleasant  Thought Process  Coherency WDL  Content WDL  Delusions None reported or observed  Perception WDL  Hallucination None reported or observed  Judgment WDL  Confusion None  Danger to Self  Current suicidal ideation? Denies  Agreement Not to Harm Self Yes  Description of Agreement verbal  Danger to Others  Danger to Others None reported or observed    "

## 2024-01-05 NOTE — Progress Notes (Cosign Needed Addendum)
 [facility] MD Progress Note  01/05/2024 6:22 PM Charlene [name]  MRN:  [id number]   Subjective:  Chart reviewed, case discussed in multidisciplinary meeting, patient seen during rounds.   1/3: Patient found engaging in day room during rounds. Patient reflected on her taking the pills not as a suicide attempt, however to sleep. She reports that she was having difficulty sleeping. She reports taking about 5 to 10 pills of hydroxyzine . Identifies that her daughter told her mother and they decided to come to the [name]. She Denies any recent SI. Denies any HI or AVH. On previous admission, she was started on Lamictal  with good effect, but reports that eventually she developed a rash. She reports that her outpatient provider increased her Wellbutrin  and they were considering adding in an SSRI. The SSRI they had discussed were Prozac  or Lexapro. She is agreeable to starting the SSRI while currently in the [name]. She remains discharge focused, but is understanding of the steps needed for safe discharge.   1/2: patient denied current suicidal ideation and denied homicidal ideation at this time. She also denied experiencing auditory hallucinations or visual hallucinations. The patient reported tolerating her current medication regimen, including Wellbutrin , well and denied any subjective medication side effects.  Of note, the patient has demonstrated elevated blood pressure readings during this hospitalization. When questioned about potential symptoms associated with hypertension, the patient denied chest pain, dizziness, headache, or shortness of breath. The patient reported that she had been off her prescribed antihypertensive medications for several days prior to being brought to the [name], which likely accounts for the currently elevated blood pressure readings. Blood pressure monitoring will continue,Patient's home medications have been restarted.    The patient expressed strong desire to be discharged  from the inpatient psychiatric unit today. She reported that she was not taking the hydroxyzine  intentionally in a suicide attempt, but rather was attempting to use this medication to help her sleep. She acknowledged taking 5 to 6 tablets of hydroxyzine  for this purpose. Chart review confirms that these medications were not the patient's own prescribed medications, but rather belonged to her daughter.   The patient reported that she currently resides with her mother as well as her children. She granted permission for the psychiatry team to contact her mother to obtain collateral information regarding her baseline functioning, recent behaviors, safety concerns, and available support systems in the home. This collateral contact is essential to inform discharge planning and assess the safety of the discharge environment.  The patient expressed continued desire to leave the [name] and voiced frustration regarding the inability to be discharged immediately. An extensive discussion was conducted with the patient regarding the clinical necessity of completing appropriate safety assessments and discharge planning before she can be safely transitioned to a lower level of care. Specifically, the treatment team explained the following:  Need for Safety Collateral: Given the circumstances of her admission involving ingestion of multiple tablets of medication not prescribed to her, the treatment team has a professional and ethical responsibility to obtain collateral information from family members who reside with the patient. This collateral contact allows the team to verify the patient's self-report, assess her baseline level of functioning, identify any concerning behaviors or statements that Adean Milosevic not have been disclosed during clinical interviews, and determine whether family members have safety concerns about her return home. Collateral information from the patient's mother will provide critical context that informs  the risk assessment and discharge safety planning.  Ensuring Appropriate Community Resources: Before discharge can occur,  the treatment team must verify that the patient has adequate outpatient psychiatric follow-up appointments scheduled, confirm that she has a safe and stable living environment to which she can be discharged, ensure that she has access to prescribed medications and understands proper medication administration, and confirm that family or other support persons are available to monitor her well-being and assist with treatment adherence.   Verification of Follow-Up Care: The patient reported that she has established follow-up appointments scheduled, including an appointment with her psychiatrist through [name] on January 08, 2024 at 11:30 AM, and an appointment with her therapist through [name] on January 09, 2024 at 2:40 PM. These appointments represent appropriate outpatient psychiatric and therapeutic support.   The patient verbalized understanding of the rationale for these necessary discharge planning steps, though she remained frustrated with the timeline. She was reassured that the team is working expeditiously to complete the required safety assessments and care coordination so that she can be discharged as soon as it is safe and clinically appropriate to do so.  The above information was relayed to the social work team during treatment team meeting.   Despite her expressed frustration regarding the discharge timeline, the patient remained calm and cooperative with the psychiatry team throughout today's evaluation. She engaged appropriately in the discussion regarding discharge planning requirements and did not demonstrate behavioral dyscontrol or verbal aggression. This cooperative engagement is a positive indicator for treatment alliance and suggests the patient is capable of participating constructively in discharge planning.  The patient will continue to be  monitored on the inpatient psychiatric unit while collateral information is obtained and discharge planning is finalized. Once safety collateral has been completed, outpatient appointments have been verified, and the treatment team is confident that appropriate community resources and supports are in place, the patient will be reassessed for discharge readiness. Daily psychiatric evaluations will continue, and the patient will be kept informed of progress toward discharge.  Past Psychiatric History: see h&P Family History:  Family History  Problem Relation Age of Onset   Hypertension Mother    Heart disease Father    Hypertension Father    Social History:  Social History   Substance and Sexual Activity  Alcohol Use Yes   Comment: patient reports drinking 3-4 shots of vodka or 2-3 glasses of wine most days     Social History   Substance and Sexual Activity  Drug Use No    Social History   Socioeconomic History   Marital status: Divorced    Spouse name: Not on file   Number of children: Not on file   Years of education: Not on file   Highest education level: Not on file  Occupational History   Not on file  Tobacco Use   Smoking status: Never   Smokeless tobacco: Never  Vaping Use   Vaping status: Never Used  Substance and Sexual Activity   Alcohol use: Yes    Comment: patient reports drinking 3-4 shots of vodka or 2-3 glasses of wine most days   Drug use: No   Sexual activity: Yes    Birth control/protection: I.U.D.  Other Topics Concern   Not on file  Social History Narrative   Not on file   Social Drivers of Health   Tobacco Use: Low Risk (01/02/2024)   Patient History    Smoking Tobacco Use: Never    Smokeless Tobacco Use: Never    Passive Exposure: Not on file  Financial Resource Strain: Not on file  Food  Insecurity: No Food Insecurity (01/02/2024)   Epic    Worried About Programme Researcher, Broadcasting/film/video in the Last Year: Never true    Ran Out of Food in the Last Year:  Never true  Transportation Needs: No Transportation Needs (01/02/2024)   Epic    Lack of Transportation (Medical): No    Lack of Transportation (Non-Medical): No  Physical Activity: Not on file  Stress: Not on file  Social Connections: Unknown (05/13/2021)   [facility]   Social Network    Social Network: Not on file  Depression [phone]): Not on file  Alcohol Screen: Low Risk (01/02/2024)   Alcohol Screen    Last Alcohol Screening Score (AUDIT): 5  Recent Concern: Alcohol Screen - High Risk (10/24/2023)   Alcohol Screen    Last Alcohol Screening Score (AUDIT): 20  Housing: Low Risk (01/02/2024)   Epic    Unable to Pay for Housing in the Last Year: No    Number of Times Moved in the Last Year: 0    Homeless in the Last Year: No  Utilities: Not At Risk (01/02/2024)   Epic    Threatened with loss of utilities: No  Health Literacy: Not on file   Past Medical History:  Past Medical History:  Diagnosis Date   Anemia    Hypertension     Past Surgical History:  Procedure Laterality Date   GASTRIC BYPASS  2000   HERNIA REPAIR  2004   ROBOTIC ASSISTED LAPAROSCOPIC HYSTERECTOMY AND SALPINGECTOMY Bilateral 11/17/2019   Procedure: XI ROBOTIC ASSISTED LAPAROSCOPIC HYSTERECTOMY AND BILATERAL SALPINGECTOMY;  Surgeon: Kandyce Sor, MD;  Location: [name];  Service: Gynecology;  Laterality: Bilateral;   VENTRAL HERNIA REPAIR  2004    Current Medications: Current Facility-Administered Medications  Medication Dose Route Frequency Provider Last Rate Last Admin   acetaminophen  (TYLENOL ) tablet 650 mg  650 mg Oral Q6H PRN White, Patrice L, NP       amLODipine  (NORVASC ) tablet 5 mg  5 mg Oral Daily White, Patrice L, NP   5 mg at 01/05/24 9175   buPROPion  (WELLBUTRIN  XL) 24 hr tablet 150 mg  150 mg Oral Daily White, Patrice L, NP   150 mg at 01/05/24 0824   levothyroxine  (SYNTHROID ) tablet 75 mcg  75 mcg Oral Q0600 White, Patrice L, NP   75 mcg at 01/05/24  0610   multivitamin with minerals tablet 1 tablet  1 tablet Oral Daily White, Patrice L, NP   1 tablet at 01/05/24 9175   thiamine  (VITAMIN B1) tablet 100 mg  100 mg Oral Daily White, Patrice L, NP   100 mg at 01/05/24 9175    Lab Results:  Results for orders placed or performed during the [name] encounter of 01/02/24 (from the past 48 hours)  TSH     Status: Abnormal   Collection Time: 01/05/24  6:46 AM  Result Value Ref Range   TSH 8.170 (H) 0.350 - 4.500 uIU/mL    Comment: Performed at [facility], [address] [place], [place] [address]     Blood Alcohol level:  Lab Results  Component Value Date   ETH 276 (H) 01/02/2024   ETH 332 (HH) 10/22/2023    Metabolic Disorder Labs: Lab Results  Component Value Date   HGBA1C 4.6 (L) 10/28/2023   MPG 85.32 10/28/2023   No results found for: PROLACTIN Lab Results  Component Value Date   CHOL 135 10/28/2023   TRIG 53 10/28/2023  HDL 52 10/28/2023   CHOLHDL 2.6 10/28/2023   VLDL 11 10/28/2023   LDLCALC 72 10/28/2023    Physical Findings: AIMS:  , ,  ,  ,    CIWA:  CIWA-Ar Total: 0 COWS:      Psychiatric Specialty Exam:  Presentation  General Appearance:  Appropriate for Environment; Casual  Eye Contact: Fair  Speech: Clear and Coherent  Speech Volume: Normal    Mood and Affect  Mood: Euthymic  Affect: Appropriate; Congruent   Thought Process  Thought Processes: Coherent; Linear  Orientation:Full (Time, Place and Person)  Thought Content:Logical  Hallucinations:Hallucinations: None  Ideas of Reference:None  Suicidal Thoughts:Suicidal Thoughts: No  Homicidal Thoughts:Homicidal Thoughts: No   Sensorium  Memory: Immediate Fair; Remote Fair; Recent Fair  Judgment: Poor  Insight: Fair   Chartered Certified Accountant: Fair  Attention Span: Fair  Recall: Good  Fund of Knowledge: Good  Language: Good   Psychomotor Activity  Psychomotor  Activity:Psychomotor Activity: Normal  Musculoskeletal: Strength & Muscle Tone: within normal limits Gait & Station: normal Assets  Assets: Manufacturing Systems Engineer; Desire for Improvement; Social Support    Physical Exam: Physical Exam Constitutional:      Appearance: Normal appearance.  Cardiovascular:     Rate and Rhythm: Normal rate.  Pulmonary:     Effort: Pulmonary effort is normal.  Neurological:     Mental Status: She is alert and oriented to person, place, and time.  Psychiatric:        Behavior: Behavior normal.    Review of Systems  Respiratory:  Negative for shortness of breath.   Cardiovascular:  Negative for chest pain.  Gastrointestinal:  Negative for diarrhea, nausea and vomiting.  Psychiatric/Behavioral:  Negative for depression, hallucinations and suicidal ideas. The patient is not nervous/anxious.   All other systems reviewed and are negative.  Blood pressure (!) 142/92, pulse 77, temperature 98.1 F (36.7 C), temperature source Oral, resp. rate 19, height 5' 10 (1.778 m), weight 126.6 kg, last menstrual period 09/17/2019, SpO2 100%. Body mass index is 40.03 kg/m.  Diagnosis: Principal Problem:   MDD (major depressive disorder), severe (HCC)   PLAN: Safety and Monitoring:  -- Voluntary admission to inpatient psychiatric unit for safety, stabilization and treatment  -- Daily contact with patient to assess and evaluate symptoms and progress in treatment  -- Patient's case to be discussed in multi-disciplinary team meeting  -- Observation Level : q15 minute checks  -- Vital signs:  q12 hours  -- Precautions: suicide, elopement, and assault -- Encouraged patient to participate in unit milieu and in scheduled group therapies   2. Psychiatric Treatment:  Scheduled Medications: Wellbutrin  daily Will start Prozac  10 mg for overall mood. Patient reported outpatient psychiatrist wanting to trial this medication.   -- The  risks/benefits/side-effects/alternatives to this medication were discussed in detail with the patient and time was given for questions. The patient consents to medication trial.   3. Medical Issues Being Addressed:  -Elevated blood pressure readings noted, but patient currently noted to be asymptomatic.  Patient did report being off blood pressure medications prior to [name] arrival.  Psychiatry team will continue to monitor. -continue prescribed home medications   4. Discharge Planning:   -- Social work and case management to assist with discharge planning and identification of [name] follow-up needs prior to discharge  -- Estimated LOS: 3-4 days  [employer] [name] [place] PMHNP

## 2024-01-06 DIAGNOSIS — F329 Major depressive disorder, single episode, unspecified: Secondary | ICD-10-CM | POA: Diagnosis not present

## 2024-01-06 MED ORDER — LORAZEPAM 0.5 MG PO TABS
0.5000 mg | ORAL_TABLET | Freq: Once | ORAL | Status: AC
  Administered 2024-01-06: 0.5 mg via ORAL
  Filled 2024-01-06: qty 1

## 2024-01-06 NOTE — BHH Counselor (Signed)
 CSW received notice from nursing staff that pt's mother was on the phone.   Nursing staff reports that pt's mother wanted to speak to pt.   CSW asked nursing staff to put call on hold as CSW had left a VM requesting a return call just minutes earlier.   CSW picked up phone call and explained to pt's mother that CSW was trying to reach her and that CSW would like to discuss discharge planning.  Pt's mother reports she is not at home and reports she was unaware that CSW left VM.   CSW asked pt's mother if they could discuss discharge planning.   Pt's mother reports it is not a good time to talk and reports she will call CSW back.   CSW awaits return call at this time.   Charlene [name], MSW, [place] 01/06/2024 2:04 PM

## 2024-01-06 NOTE — Progress Notes (Signed)
" °   01/06/24 0840  Psych Admission Type (Psych Patients Only)  Admission Status Voluntary  Psychosocial Assessment  Patient Complaints None  Eye Contact Fair  Facial Expression Anxious  Affect Appropriate to circumstance  Speech Logical/coherent  Interaction Assertive  Motor Activity Slow  Appearance/Hygiene Unremarkable;In scrubs  Behavior Characteristics Cooperative;Appropriate to situation  Mood Pleasant  Thought Process  Coherency WDL  Content WDL  Delusions None reported or observed  Perception WDL  Hallucination None reported or observed  Judgment WDL  Confusion None  Danger to Self  Current suicidal ideation? Denies  Agreement Not to Harm Self Yes  Description of Agreement verbal  Danger to Others  Danger to Others None reported or observed    "

## 2024-01-06 NOTE — Progress Notes (Signed)
" °   01/05/24 2100  Psych Admission Type (Psych Patients Only)  Admission Status Voluntary  Psychosocial Assessment  Patient Complaints None  Eye Contact Fair  Facial Expression Animated  Affect Appropriate to circumstance  Speech Logical/coherent  Interaction Assertive  Motor Activity Slow  Appearance/Hygiene Unremarkable  Behavior Characteristics Cooperative;Appropriate to situation  Mood Pleasant  Aggressive Behavior  Effect No apparent injury  Thought Process  Coherency WDL  Content WDL  Delusions None reported or observed  Perception WDL  Hallucination None reported or observed  Judgment WDL  Confusion None  Danger to Self  Current suicidal ideation? Denies  Agreement Not to Harm Self Yes  Description of Agreement Verbal  Danger to Others  Danger to Others None reported or observed    "
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
## 2024-01-06 NOTE — [facility] Suicide Risk Assessment (Signed)
 [facility] INPATIENT:  Family/Significant Other Suicide Prevention Education  Suicide Prevention Education:  Education Completed; Graylin Hare, mother, [phone]- [phone] (name of family member/significant other) has been identified by the patient as the family member/significant other with whom the patient will be residing, and identified as the person(s) who will aid the patient in the event of a mental health crisis (suicidal ideations/suicide attempt).  With written consent from the patient, the family member/significant other has been provided the following suicide prevention education, prior to the and/or following the discharge of the patient.  The suicide prevention education provided includes the following: Suicide risk factors Suicide prevention and interventions National Suicide Hotline telephone number [facility] assessment telephone number [facility] Emergency Assistance 911 [facility] and/or Residential Mobile Crisis Unit telephone number  Request made of family/significant other to: Remove weapons (e.g., guns, rifles, knives), all items previously/currently identified as safety concern.   Remove drugs/medications (over-the-counter, prescriptions, illicit drugs), all items previously/currently identified as a safety concern.  The family member/significant other verbalizes understanding of the suicide prevention education information provided.  The family member/significant other agrees to remove the items of safety concern listed above.  Lum JONETTA Croft 01/06/2024, 2:31 PM

## 2024-01-06 NOTE — Plan of Care (Signed)
   Problem: Education: Goal: Knowledge of Charlene [name] General Education information/materials will improve Outcome: Progressing Goal: Emotional status will improve Outcome: Progressing Goal: Mental status will improve Outcome: Progressing Goal: Verbalization of understanding the information provided will improve Outcome: Progressing   Problem: Activity: Goal: Interest or engagement in activities will improve Outcome: Progressing Goal: Sleeping patterns will improve Outcome: Progressing   Problem: Coping: Goal: Ability to verbalize frustrations and anger appropriately will improve Outcome: Progressing Goal: Ability to demonstrate self-control will improve Outcome: Progressing

## 2024-01-06 NOTE — Plan of Care (Signed)
  Problem: Education: Goal: Emotional status will improve Outcome: Progressing Goal: Mental status will improve Outcome: Progressing   Problem: Education: Goal: Mental status will improve Outcome: Progressing   

## 2024-01-06 NOTE — [facility] Suicide Risk Assessment (Signed)
 [facility] INPATIENT:  Family/Significant Other Suicide Prevention Education  Suicide Prevention Education:  Contact Attempts: Charlene [name], mother, [phone] , (name of family member/significant other) has been identified by the patient as the family member/significant other with whom the patient will be residing, and identified as the person(s) who will aid the patient in the event of a mental health crisis.  With written consent from the patient, two attempts were made to provide suicide prevention education, prior to and/or following the patient's discharge.  We were unsuccessful in providing suicide prevention education.  A suicide education pamphlet was given to the patient to share with family/significant other.   Date and time of first attempt:01/06/2024 at 1:48 PM    Charlene [name] 01/06/2024, 1:48 PM

## 2024-01-06 NOTE — Group Note (Signed)
 Date:  01/06/2024 Time:  10:36 PM  Group Topic/Focus:  Making Healthy Choices:   The focus of this group is to help patients identify negative/unhealthy choices they were using prior to admission and identify positive/healthier coping strategies to replace them upon discharge. Self Care:   The focus of this group is to help patients understand the importance of self-care in order to improve or restore emotional, physical, spiritual, interpersonal, and financial health.    Participation Level:  Active  Participation Quality:  Appropriate and Attentive  Affect:  Appropriate  Cognitive:  Alert, Appropriate, and Oriented  Insight: Appropriate and Good  Engagement in Group:  Engaged  Modes of Intervention:  Discussion and Support  Additional Comments:  N/A  Butler LITTIE Gelineau 01/06/2024, 10:36 PM

## 2024-01-07 DIAGNOSIS — F332 Major depressive disorder, recurrent severe without psychotic features: Secondary | ICD-10-CM | POA: Diagnosis not present

## 2024-01-07 MED ORDER — HYDROXYZINE HCL 25 MG PO TABS: 25.0000 mg | ORAL_TABLET | Freq: Four times a day (QID) | ORAL | 0 refills | Status: AC | PRN

## 2024-01-07 MED ORDER — BUPROPION HCL ER (XL) 150 MG PO TB24: 150.0000 mg | ORAL_TABLET | Freq: Every day | ORAL | 0 refills | Status: AC

## 2024-01-07 MED ORDER — HYDROXYZINE HCL 25 MG PO TABS
25.0000 mg | ORAL_TABLET | Freq: Four times a day (QID) | ORAL | Status: DC | PRN
  Administered 2024-01-07: 25 mg via ORAL
  Filled 2024-01-07: qty 1

## 2024-01-07 MED ORDER — FLUOXETINE HCL 10 MG PO CAPS: 10.0000 mg | ORAL_CAPSULE | Freq: Every day | ORAL | 0 refills | Status: AC

## 2024-01-07 NOTE — Progress Notes (Signed)
" °   01/06/24 2100  Psych Admission Type (Psych Patients Only)  Admission Status Voluntary  Psychosocial Assessment  Patient Complaints None  Eye Contact Fair  Facial Expression Animated  Affect Appropriate to circumstance  Speech Logical/coherent  Interaction Assertive  Motor Activity Slow  Appearance/Hygiene Unremarkable  Behavior Characteristics Appropriate to situation  Mood Pleasant  Aggressive Behavior  Effect No apparent injury  Thought Process  Coherency WDL  Content WDL  Delusions None reported or observed  Perception WDL  Hallucination None reported or observed  Judgment WDL  Confusion None  Danger to Self  Current suicidal ideation? Denies  Agreement Not to Harm Self Yes  Description of Agreement Verbal  Danger to Others  Danger to Others None reported or observed    "

## 2024-01-07 NOTE — Group Note (Deleted)
 Date:  01/07/2024 Time:  12:45 PM  Group Topic/Focus:  Building Self Esteem:   The Focus of this group is helping patients become aware of the effects of self-esteem on their lives, the things they and others do that enhance or undermine their self-esteem, seeing the relationship between their level of self-esteem and the choices they make and learning ways to enhance self-esteem.     Participation Level:  {BHH PARTICIPATION OZCZO:77735}  Participation Quality:  {BHH PARTICIPATION QUALITY:22265}  Affect:  {BHH AFFECT:22266}  Cognitive:  {BHH COGNITIVE:22267}  Insight: {BHH Insight2:20797}  Engagement in Group:  {BHH ENGAGEMENT IN HMNLE:77731}  Modes of Intervention:  {BHH MODES OF INTERVENTION:22269}  Additional Comments:  ***  Camellia HERO Brion Hedges 01/07/2024, 12:45 PM

## 2024-01-07 NOTE — Progress Notes (Signed)
 Pt calm and pleasant during assessment denying SI/HI/AVH. Pt observed interacting appropriately with staff and peers on the unit. Pt compliant with medication administration per MD orders. Pt given education, support, and encouragement to be active in her treatment plan. Pt being monitored Q 15 minutes for safety per unit protocol, remains safe on the unit

## 2024-01-07 NOTE — Group Note (Signed)
 Recreation Therapy Group Note   Group Topic:General Recreation  Group Date: 01/07/2024 Start Time: 1530 End Time: 1615 Facilitators: Celestia Jeoffrey BRAVO, LRT, CTRS Location: Courtyard  Group Description: [employer]. LRT and patients played games of basketball, drew with chalk, and played corn hole while outside in the courtyard while getting fresh air and sunlight. Music was being played in the background. LRT and peers conversed about different games they have played before, what they do in their free time and anything else that is on their minds. LRT encouraged pts to drink water after being outside, sweating and getting their heart rate up.  Goal Area(s) Addressed: Patient will build on frustration tolerance skills. Patients will partake in a competitive play game with peers. Patients will gain knowledge of new leisure interest/hobby.    Affect/Mood: N/A   Participation Level: Did not attend    Clinical Observations/Individualized Feedback: Patient did not attend.  Plan: Continue to engage patient in RT group sessions 2-3x/week.   Jeoffrey BRAVO Celestia, LRT, CTRS 01/07/2024 5:06 PM

## 2024-01-07 NOTE — [facility] Suicide Risk Assessment (Signed)
 [facility] Discharge Suicide Risk Assessment   Principal Problem: <principal problem not specified> Discharge Diagnoses: Active Problems:   MDD (major depressive disorder), recurrent severe, without psychosis (HCC)   Total Time spent with patient: 30 minutes  Musculoskeletal: Strength & Muscle Tone: within normal limits Gait & Station: normal Patient leans: N/A  Psychiatric Specialty Exam  Presentation  General Appearance:  Appropriate for Environment  Eye Contact: Fair  Speech: Clear and Coherent  Speech Volume: Normal  Handedness: Right   Mood and Affect  Mood: Anxious  Duration of Depression Symptoms: No data recorded Affect: Appropriate   Thought Process  Thought Processes: Coherent  Descriptions of Associations:Intact  Orientation:Full (Time, Place and Person)  Thought Content:Logical  History of Schizophrenia/Schizoaffective disorder:No data recorded Duration of Psychotic Symptoms:No data recorded Hallucinations:Hallucinations: None  Ideas of Reference:None  Suicidal Thoughts:Suicidal Thoughts: No  Homicidal Thoughts:Homicidal Thoughts: No   Sensorium  Memory: Immediate Fair; Remote Fair  Judgment: Fair  Insight: Fair   Art Therapist  Concentration: Fair  Attention Span: Fair  Recall: [employer] of Knowledge: Fair  Language: Fair   Psychomotor Activity  Psychomotor Activity: Psychomotor Activity: Normal   Assets  Assets: Communication Skills; Resilience   Sleep  Sleep: Sleep: Fair  Estimated Sleeping Duration (Last 24 Hours): 5.50-5.75 hours  Physical Exam: Physical Exam Vitals and nursing note reviewed.    ROS Blood pressure (!) 148/91, pulse 88, temperature 98.4 F (36.9 C), temperature source Oral, resp. rate 18, height 5' 10 (1.778 m), weight 126.6 kg, last menstrual period 09/17/2019, SpO2 98%. Body mass index is 40.03 kg/m.  Mental Status Per Nursing Assessment::   On Admission:   NA  Demographic Factors:  Female  Loss Factors: Decrease in vocational status  Historical Factors: Impulsivity  Risk Reduction Factors:   Living with another person, especially a relative, Positive social support, Positive therapeutic relationship, and Positive coping skills or problem solving skills  Continued Clinical Symptoms:  Depression:   Impulsivity  Cognitive Features That Contribute To Risk:  None    Suicide Risk:  Minimal: No identifiable suicidal ideation.  Patients presenting with no risk factors but with morbid ruminations; may be classified as minimal risk based on the severity of the depressive symptoms   Follow-up Information     [facility] [place] Follow up on 01/08/2024.   Why: Your appointment is scheduled for psychiatry for 01/08/2024 at 11:30 AM Contact information: [address] [place] [address] [phone]         [facility] [address] [facility] Follow up on 01/09/2024.   Why: Your appointment is scheduled for therapy on 01/09/2024 at 2:40 PM Contact information: [address] [place] [address] [phone]                 Plan Of Care/Follow-up recommendations:  Activity:  as tolerated  Allyn Foil, MD 01/07/2024, 11:07 PM

## 2024-01-07 NOTE — Plan of Care (Signed)
   Problem: Education: Goal: Knowledge of Charlene [name] General Education information/materials will improve Outcome: Progressing Goal: Emotional status will improve Outcome: Progressing Goal: Mental status will improve Outcome: Progressing Goal: Verbalization of understanding the information provided will improve Outcome: Progressing   Problem: Activity: Goal: Interest or engagement in activities will improve Outcome: Progressing Goal: Sleeping patterns will improve Outcome: Progressing   Problem: Coping: Goal: Ability to verbalize frustrations and anger appropriately will improve Outcome: Progressing Goal: Ability to demonstrate self-control will improve Outcome: Progressing

## 2024-01-07 NOTE — Group Note (Signed)
 Date:  01/07/2024 Time:  8:54 PM  Group Topic/Focus:  Wrap-Up Group:   The focus of this group is to help patients review their daily goal of treatment and discuss progress on daily workbooks.    Participation Level:  Active  Participation Quality:  Appropriate and Attentive  Affect:  Appropriate  Cognitive:  Alert and Appropriate  Insight: Appropriate and Good  Engagement in Group:  Engaged  Modes of Intervention:  Reality Testing  Additional Comments:     Arlester CHRISTELLA Servant 01/07/2024, 8:54 PM
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
## 2024-01-07 NOTE — Plan of Care (Signed)

## 2024-01-07 NOTE — Group Note (Signed)
 Recreation Therapy Group Note   Group Topic:Coping Skills  Group Date: 01/07/2024 Start Time: 1050 End Time: 1135 Facilitators: Celestia Jeoffrey BRAVO, LRT, CTRS Location: Craft Room  Group Description: Mind Map.  Patient was provided a blank template of a diagram with 32 blank boxes in a tiered system, branching from the center (similar to a bubble chart). LRT directed patients to label the middle of the diagram Coping Skills. LRT and patients then came up with 8 different coping skills as examples. Pt were directed to record their coping skills in the 2nd tier boxes closest to the center.  Patients would then share their coping skills with the group as LRT wrote them out. LRT gave a handout of 99 different coping skills at the end of group.   Goal Area(s) Addressed: Patients will be able to define coping skills. Patient will identify new coping skills.  Patient will increase communication.   Affect/Mood: N/A   Participation Level: Did not attend    Clinical Observations/Individualized Feedback: Patient did not attend.  Plan: Continue to engage patient in RT group sessions 2-3x/week.   Jeoffrey BRAVO Celestia, LRT, CTRS 01/07/2024 12:40 PM

## 2024-01-07 NOTE — Group Note (Signed)
 LCSW Group Therapy Note  Group Date: 01/07/2024 Start Time: 1315 End Time: 1400   Type of Therapy and Topic:  Group Therapy: Anger Cues and Responses  Participation Level:  None   Description of Group:   In this group, patients learned how to recognize the physical, cognitive, emotional, and behavioral responses they have to anger-provoking situations.  They identified a recent time they became angry and how they reacted.  They analyzed how their reaction was possibly beneficial and how it was possibly unhelpful.  The group discussed a variety of healthier coping skills that could help with such a situation in the future.  Focus was placed on how helpful it is to recognize the underlying emotions to our anger, because working on those can lead to a more permanent solution as well as our ability to focus on the important rather than the urgent.  Therapeutic Goals: Patients will remember their last incident of anger and how they felt emotionally and physically, what their thoughts were at the time, and how they behaved. Patients will identify how their behavior at that time worked for them, as well as how it worked against them. Patients will explore possible new behaviors to use in future anger situations. Patients will learn that anger itself is normal and cannot be eliminated, and that healthier reactions can assist with resolving conflict rather than worsening situations.  Summary of Patient Progress:   Patient attended group, however did not participate in group discussion.    Therapeutic Modalities:   Cognitive Behavioral Therapy    [name], LCSW 01/07/2024  2:42 PM

## 2024-01-08 DIAGNOSIS — F332 Major depressive disorder, recurrent severe without psychotic features: Secondary | ICD-10-CM | POA: Diagnosis not present

## 2024-01-08 NOTE — Progress Notes (Signed)
" °  [facility] Adult Case Management Discharge Plan :  Will you be returning to the same living situation after discharge:  Yes,  pt reports that she is returning home.  At discharge, do you have transportation home?: Yes,  CSW to assist with transportation needs. Do you have the ability to pay for your medications: No.  Release of information consent forms completed and in the chart;  Patient's signature needed at discharge.  Patient to Follow up at:  Follow-up Information     [facility] [place] Follow up on 01/08/2024.   Why: Your appointment is scheduled for psychiatry for 01/09/2024 at 1:00 PM. Contact information: [address] [place] [address] [phone]         [facility] [place] Follow up on 01/09/2024.   Why: Your appointment is scheduled for therapy on 01/09/2024 at 2:40 PM Contact information: [address] [place] [address] [phone]                 Next level of care provider has access to [facility] Link:no  Safety Planning and Suicide Prevention discussed: Yes,  SPE completed with the patient and patients mother.      Has patient been referred to the Quitline?: Patient does not use tobacco/nicotine products  Patient has been referred for addiction treatment: Yes, the patient will follow up with an outpatient provider for substance use disorder. Psychiatrist/APP: appointment made  [name], LCSW 01/08/2024, 10:14 AM "

## 2024-01-08 NOTE — Plan of Care (Signed)
 " Problem: Education: Goal: Knowledge of [facility] Education information/materials will improve 01/08/2024 1044 by Shirley Jon FALCON, RN Outcome: Completed/Met 01/08/2024 1043 by Shirley Jon FALCON, RN Outcome: Adequate for Discharge Goal: Emotional status will improve 01/08/2024 1044 by Shirley Jon FALCON, RN Outcome: Completed/Met 01/08/2024 1043 by Shirley Jon FALCON, RN Outcome: Adequate for Discharge Goal: Mental status will improve 01/08/2024 1044 by Shirley Jon FALCON, RN Outcome: Completed/Met 01/08/2024 1043 by Shirley Jon FALCON, RN Outcome: Adequate for Discharge Goal: Verbalization of understanding the information provided will improve 01/08/2024 1044 by Shirley Jon FALCON, RN Outcome: Completed/Met 01/08/2024 1043 by Shirley Jon FALCON, RN Outcome: Adequate for Discharge   Problem: Activity: Goal: Interest or engagement in activities will improve 01/08/2024 1044 by Shirley Jon FALCON, RN Outcome: Completed/Met 01/08/2024 1043 by Shirley Jon FALCON, RN Outcome: Adequate for Discharge Goal: Sleeping patterns will improve 01/08/2024 1044 by Shirley Jon FALCON, RN Outcome: Completed/Met 01/08/2024 1043 by Shirley Jon FALCON, RN Outcome: Adequate for Discharge   Problem: Coping: Goal: Ability to verbalize frustrations and anger appropriately will improve 01/08/2024 1044 by Shirley Jon FALCON, RN Outcome: Completed/Met 01/08/2024 1043 by Shirley Jon FALCON, RN Outcome: Adequate for Discharge Goal: Ability to demonstrate self-control will improve 01/08/2024 1044 by Shirley Jon FALCON, RN Outcome: Completed/Met 01/08/2024 1043 by Shirley Jon FALCON, RN Outcome: Adequate for Discharge   Problem: Health Behavior/Discharge Planning: Goal: Identification of resources available to assist in meeting health care needs will improve 01/08/2024 1044 by Shirley Jon FALCON, RN Outcome: Completed/Met 01/08/2024 1043 by Shirley Jon FALCON, RN Outcome: Adequate for Discharge Goal: Compliance with treatment plan for  underlying cause of condition will improve 01/08/2024 1044 by Shirley Jon FALCON, RN Outcome: Completed/Met 01/08/2024 1043 by Shirley Jon FALCON, RN Outcome: Adequate for Discharge   Problem: Physical Regulation: Goal: Ability to maintain clinical measurements within normal limits will improve 01/08/2024 1044 by Shirley Jon FALCON, RN Outcome: Completed/Met 01/08/2024 1043 by Shirley Jon FALCON, RN Outcome: Adequate for Discharge   Problem: Safety: Goal: Periods of time without injury will increase 01/08/2024 1044 by Shirley Jon FALCON, RN Outcome: Completed/Met 01/08/2024 1043 by Shirley Jon FALCON, RN Outcome: Adequate for Discharge   Problem: Education: Goal: Knowledge of [facility] Education information/materials will improve 01/08/2024 1044 by Shirley Jon FALCON, RN Outcome: Completed/Met 01/08/2024 1043 by Shirley Jon FALCON, RN Outcome: Adequate for Discharge Goal: Emotional status will improve 01/08/2024 1044 by Shirley Jon FALCON, RN Outcome: Completed/Met 01/08/2024 1043 by Shirley Jon FALCON, RN Outcome: Adequate for Discharge Goal: Mental status will improve 01/08/2024 1044 by Shirley Jon FALCON, RN Outcome: Completed/Met 01/08/2024 1043 by Shirley Jon FALCON, RN Outcome: Adequate for Discharge Goal: Verbalization of understanding the information provided will improve 01/08/2024 1044 by Shirley Jon FALCON, RN Outcome: Completed/Met 01/08/2024 1043 by Shirley Jon FALCON, RN Outcome: Adequate for Discharge   Problem: Safety: Goal: Periods of time without injury will increase 01/08/2024 1044 by Shirley Jon FALCON, RN Outcome: Completed/Met 01/08/2024 1043 by Shirley Jon FALCON, RN Outcome: Adequate for Discharge   Problem: Coping: Goal: Coping ability will improve 01/08/2024 1044 by Shirley Jon FALCON, RN Outcome: Completed/Met 01/08/2024 1043 by Shirley Jon FALCON, RN Outcome: Adequate for Discharge Goal: Will verbalize feelings 01/08/2024 1044 by Shirley Jon FALCON, RN Outcome:  Completed/Met 01/08/2024 1043 by Shirley Jon FALCON, RN Outcome: Adequate for Discharge   Problem: Self-Concept: Goal: Level of anxiety will decrease 01/08/2024 1044 by Shirley Jon FALCON, RN Outcome: Completed/Met 01/08/2024 1043 by Shirley Jon FALCON, RN Outcome: Adequate for Discharge  Problem: Education: Goal: Ability to make informed decisions regarding treatment will improve 01/08/2024 1044 by Shirley Jon FALCON, RN Outcome: Completed/Met 01/08/2024 1043 by Shirley Jon FALCON, RN Outcome: Adequate for Discharge   Problem: Coping: Goal: Coping ability will improve 01/08/2024 1044 by Shirley Jon FALCON, RN Outcome: Completed/Met 01/08/2024 1043 by Shirley Jon FALCON, RN Outcome: Adequate for Discharge   "

## 2024-01-08 NOTE — Group Note (Signed)
 Date:  01/08/2024 Time:  7:46 AM  Group Topic/Focus:  Wellness Toolbox:   The focus of this group is to discuss various aspects of wellness, balancing those aspects and exploring ways to increase the ability to experience wellness.  Patients will create a wellness toolbox for use upon discharge.    Participation Level:  Active  Participation Quality:  Appropriate  Affect:  Appropriate  Cognitive:  Appropriate  Insight: Appropriate  Engagement in Group:  Engaged  Modes of Intervention:  Discussion  Additional Comments:    Leigh VEAR Pais 01/08/2024, 7:46 AM

## 2024-01-08 NOTE — Progress Notes (Signed)
" °   01/08/24 0830  Psych Admission Type (Psych Patients Only)  Admission Status Voluntary  Psychosocial Assessment  Patient Complaints None  Eye Contact Fair  Facial Expression Flat  Affect Appropriate to circumstance  Speech Logical/coherent  Interaction Assertive  Motor Activity Slow  Appearance/Hygiene Unremarkable  Behavior Characteristics Appropriate to situation  Mood Pleasant  Aggressive Behavior  Effect No apparent injury  Thought Process  Coherency WDL  Content WDL  Delusions None reported or observed  Perception WDL  Hallucination None reported or observed  Judgment WDL  Confusion None  Danger to Self  Current suicidal ideation?  (denies)  Agreement Not to Harm Self Yes  Description of Agreement Verbal  Danger to Others  Danger to Others None reported or observed    "

## 2024-01-08 NOTE — Group Note (Signed)
 Date:  01/08/2024 Time:  7:54 AM  Group Topic/Focus:  Self Care:   The focus of this group is to help patients understand the importance of self-care in order to improve or restore emotional, physical, spiritual, interpersonal, and financial health. [facility] took patients outside to get some air and enjoy nature.    Participation Level:  Did Not Attend  Leigh VEAR Pais 01/08/2024, 7:54 AM

## 2024-01-08 NOTE — Plan of Care (Signed)
" °  Problem: Education: Goal: Knowledge of [facility] Education information/materials will improve Outcome: Adequate for Discharge Goal: Emotional status will improve Outcome: Adequate for Discharge Goal: Mental status will improve Outcome: Adequate for Discharge Goal: Verbalization of understanding the information provided will improve Outcome: Adequate for Discharge   Problem: Activity: Goal: Interest or engagement in activities will improve Outcome: Adequate for Discharge Goal: Sleeping patterns will improve Outcome: Adequate for Discharge   Problem: Coping: Goal: Ability to verbalize frustrations and anger appropriately will improve Outcome: Adequate for Discharge Goal: Ability to demonstrate self-control will improve Outcome: Adequate for Discharge   Problem: Health Behavior/Discharge Planning: Goal: Identification of resources available to assist in meeting health care needs will improve Outcome: Adequate for Discharge Goal: Compliance with treatment plan for underlying cause of condition will improve Outcome: Adequate for Discharge   Problem: Physical Regulation: Goal: Ability to maintain clinical measurements within normal limits will improve Outcome: Adequate for Discharge   Problem: Safety: Goal: Periods of time without injury will increase Outcome: Adequate for Discharge   Problem: Education: Goal: Knowledge of [facility] Education information/materials will improve Outcome: Adequate for Discharge Goal: Emotional status will improve Outcome: Adequate for Discharge Goal: Mental status will improve Outcome: Adequate for Discharge Goal: Verbalization of understanding the information provided will improve Outcome: Adequate for Discharge   Problem: Safety: Goal: Periods of time without injury will increase Outcome: Adequate for Discharge   Problem: Coping: Goal: Coping ability will improve Outcome: Adequate for Discharge Goal: Will verbalize  feelings Outcome: Adequate for Discharge   Problem: Self-Concept: Goal: Level of anxiety will decrease Outcome: Adequate for Discharge   Problem: Education: Goal: Ability to make informed decisions regarding treatment will improve Outcome: Adequate for Discharge   Problem: Coping: Goal: Coping ability will improve Outcome: Adequate for Discharge   "

## 2024-01-09 NOTE — Discharge Summary (Signed)
 " Physician Discharge Summary Note  Patient:  Charlene [name] is an 54 y.o., female MRN:  [id number] DOB:  April 19, 1970 Patient phone:  [phone] (home)  Patient address:   [address] [place] [address],   Total time spent: 40 min Date of Admission:  01/02/2024 Date of Discharge: 01/08/2024  Reason for Admission:  Charlene [name] is a 54 year old female with a history of bipolar 1 disorder, major depressive disorder (MDD), alcohol use disorder, and prior suicide attempt by overdose. She presented to the ED after ingesting approximately 10-15 tablets of hydroxyzine  25 mg (daughters prescription) and consuming alcohol (22 oz beer + several glasses of wine). She reports taking the medications to sleep and denies current suicidal intent, plan, or recent self-harm. She admits feeling hopeless and stressed due to work-related issues. Patient is admitted to  psych unit with Q15 min safety monitoring. Multidisciplinary team approach is offered. Medication management; group/milieu therapy is offered.   Principal Problem: <principal problem not specified> Discharge Diagnoses: Active Problems:   MDD (major depressive disorder), recurrent severe, without psychosis (HCC)   Past Psychiatric History: see h&p  Family Psychiatric  History: see h&p Social History:  Social History   Substance and Sexual Activity  Alcohol Use Yes   Comment: patient reports drinking 3-4 shots of vodka or 2-3 glasses of wine most days     Social History   Substance and Sexual Activity  Drug Use No    Social History   Socioeconomic History   Marital status: Divorced    Spouse name: Not on file   Number of children: Not on file   Years of education: Not on file   Highest education level: Not on file  Occupational History   Not on file  Tobacco Use   Smoking status: Never   Smokeless tobacco: Never  Vaping Use   Vaping status: Never Used  Substance and Sexual Activity   Alcohol use:  Yes    Comment: patient reports drinking 3-4 shots of vodka or 2-3 glasses of wine most days   Drug use: No   Sexual activity: Yes    Birth control/protection: I.U.D.  Other Topics Concern   Not on file  Social History Narrative   Not on file   Social Drivers of Health   Tobacco Use: Low Risk (01/02/2024)   Patient History    Smoking Tobacco Use: Never    Smokeless Tobacco Use: Never    Passive Exposure: Not on file  Financial Resource Strain: Not on file  Food Insecurity: No Food Insecurity (01/02/2024)   Epic    Worried About Programme Researcher, Broadcasting/film/video in the Last Year: Never true    Ran Out of Food in the Last Year: Never true  Transportation Needs: No Transportation Needs (01/02/2024)   Epic    Lack of Transportation (Medical): No    Lack of Transportation (Non-Medical): No  Physical Activity: Not on file  Stress: Not on file  Social Connections: Unknown (05/13/2021)   Received from [facility]   Social Network    Social Network: Not on file  Depression [phone]): Not on file  Alcohol Screen: Low Risk (01/02/2024)   Alcohol Screen    Last Alcohol Screening Score (AUDIT): 5  Recent Concern: Alcohol Screen - High Risk (10/24/2023)   Alcohol Screen    Last Alcohol Screening Score (AUDIT): 20  Housing: Low Risk (01/02/2024)   Epic    Unable to Pay for Housing in the Last Year: No  Number of Times Moved in the Last Year: 0    Homeless in the Last Year: No  Utilities: Not At Risk (01/02/2024)   Epic    Threatened with loss of utilities: No  Health Literacy: Not on file   Past Medical History:  Past Medical History:  Diagnosis Date   Anemia    Hypertension     Past Surgical History:  Procedure Laterality Date   GASTRIC BYPASS  2000   HERNIA REPAIR  2004   ROBOTIC ASSISTED LAPAROSCOPIC HYSTERECTOMY AND SALPINGECTOMY Bilateral 11/17/2019   Procedure: XI ROBOTIC ASSISTED LAPAROSCOPIC HYSTERECTOMY AND BILATERAL SALPINGECTOMY;  Surgeon: Kandyce Sor, MD;  Location:  [facility];  Service: Gynecology;  Laterality: Bilateral;   VENTRAL HERNIA REPAIR  2004   Family History:  Family History  Problem Relation Age of Onset   Hypertension Mother    Heart disease Father    Hypertension Father     Hospital Course:  Charlene [name] is a 54 year old female with a history of bipolar 1 disorder, major depressive disorder (MDD), alcohol use disorder, and prior suicide attempt by overdose. She presented to the ED after ingesting approximately 10-15 tablets of hydroxyzine  25 mg (daughters prescription) and consuming alcohol (22 oz beer + several glasses of wine). She reports taking the medications to sleep and denies current suicidal intent, plan, or recent self-harm. She admits feeling hopeless and stressed due to work-related issues. Patient is admitted to  psych unit with Q15 min safety monitoring. Multidisciplinary team approach is offered. Medication management; group/milieu therapy is offered.   On admission, patient was maintained on Wellbutrin  XL 150 and Prozac  10 mg to help with depression and anxiety/energy/motivation.  Patient tolerated medications very well with no reported side effects.  Patient displayed improvement in her mood.  She participated in groups.  Discharge planning and treatment plan was discussed between patient and her family.  Detailed risk assessment is complete based on clinical exam and individual risk factors and acute suicide risk is low and acute violence risk is low.    On the day of discharge, patient denies SI/HI/plan and denies hallucinations.  Patient remains future oriented and is willing to participate in outpatient mental health services.  Currently, all modifiable risk of harm to self/harm to others have been addressed and patient is no longer appropriate for the acute inpatient setting and is able to continue treatment for mental health needs in the community with the supports as indicated below.  Patient is  educated and verbalized understanding of discharge plan of care including medications, follow-up appointments, mental health resources and further crisis services in the community.  He is instructed to call 911 or present to the nearest emergency room should he experience any decompensation in mood, disturbance of bowel or return of suicidal/homicidal ideations.  Patient verbalizes understanding of this education and agrees to this plan of care  Physical Findings: AIMS:  , ,  ,  ,    CIWA:  CIWA-Ar Total: 0 COWS:      Psychiatric Specialty Exam:  Presentation  General Appearance:  Appropriate for Environment  Eye Contact: Fair  Speech: Clear and Coherent  Speech Volume: Normal    Mood and Affect  Mood: Anxious  Affect: Appropriate   Thought Process  Thought Processes: Coherent  Descriptions of Associations:Intact  Orientation:Full (Time, Place and Person)  Thought Content:Logical  Hallucinations:No data recorded Ideas of Reference:None  Suicidal Thoughts:No data recorded Homicidal Thoughts:No data recorded  Sensorium  Memory: Immediate Fair; Remote Fair  Judgment: Fair  Insight: Fair   Chartered Certified Accountant: Fair  Attention Span: Fair  Recall: Charlene [name] of Knowledge: Fair  Language: Fair   Psychomotor Activity  Psychomotor Activity:No data recorded Musculoskeletal: Strength & Muscle Tone: within normal limits Gait & Station: normal Assets  Assets: Manufacturing Systems Engineer; Resilience   Sleep  Sleep:No data recorded   Physical Exam: Physical Exam ROS Blood pressure (!) 142/88, pulse 79, temperature 98.2 F (36.8 C), temperature source Oral, resp. rate 16, height 5' 10 (1.778 m), weight 126.6 kg, last menstrual period 09/17/2019, SpO2 100%. Body mass index is 40.03 kg/m.   Tobacco Use History[1] Tobacco Cessation:  N/A, patient does not currently use tobacco products   Blood Alcohol level:  Lab Results   Component Value Date   ETH 276 (H) 01/02/2024   ETH 332 (HH) 10/22/2023    Metabolic Disorder Labs:  Lab Results  Component Value Date   HGBA1C 4.6 (L) 10/28/2023   MPG 85.32 10/28/2023   No results found for: PROLACTIN Lab Results  Component Value Date   CHOL 135 10/28/2023   TRIG 53 10/28/2023   HDL 52 10/28/2023   CHOLHDL 2.6 10/28/2023   VLDL 11 10/28/2023   LDLCALC 72 10/28/2023    See Psychiatric Specialty Exam and Suicide Risk Assessment completed by Attending Physician prior to discharge.  Discharge destination:  Home  Is patient on multiple antipsychotic therapies at discharge:  No   Has Patient had three or more failed trials of antipsychotic monotherapy by history:  No  Recommended Plan for Multiple Antipsychotic Therapies: NA   Allergies as of 01/08/2024       Reactions   Penicillins Hives, Shortness Of Breath   Heart rate increases   Shellfish Allergy Anaphylaxis   Swells up face Swells up face        Medication List     STOP taking these medications    hydrocortisone  cream 0.5 %   vitamin A & D ointment       TAKE these medications      Indication  amLODipine  5 MG tablet Commonly known as: NORVASC  Take 1 tablet (5 mg total) by mouth daily.  Indication: High Blood Pressure   buPROPion  150 MG 24 hr tablet Commonly known as: WELLBUTRIN  XL Take 1 tablet (150 mg total) by mouth daily.  Indication: Major Depressive Disorder   FLUoxetine  10 MG capsule Commonly known as: PROZAC  Take 1 capsule (10 mg total) by mouth daily.  Indication: Depression   hydrOXYzine  25 MG tablet Commonly known as: ATARAX  Take 1 tablet (25 mg total) by mouth every 6 (six) hours as needed for anxiety.  Indication: Feeling Anxious   levothyroxine  75 MCG tablet Commonly known as: SYNTHROID  Take 1 tablet (75 mcg total) by mouth daily at 6 (six) AM.  Indication: Underactive Thyroid   thiamine  100 MG tablet Commonly known as: Vitamin B-1 Take 1 tablet  (100 mg total) by mouth daily.  Indication: Deficiency of Vitamin B1        Follow-up Information     [facility] Follow up on 01/08/2024.   Why: Your appointment is scheduled for psychiatry for 01/09/2024 at 1:00 PM. Contact information: [address] [place] [address] [phone]         [facility] Follow up on 01/09/2024.   Why: Your appointment is scheduled for therapy on 01/09/2024 at 2:40 PM Contact information: [address] [place]  [address] [place] [address] [phone]                 Follow-up recommendations:  Activity:  As tolerated    Signed: Nailah Luepke, MD 01/09/2024, 9:31 PM          [1]  Social History Tobacco Use  Smoking Status Never  Smokeless Tobacco Never   "

## 2024-03-07 ENCOUNTER — Ambulatory Visit: Payer: Self-pay | Admitting: Family

## 2024-05-03 IMAGING — US US ABDOMEN LIMITED
1 series · 14 of 25 positions shown · non-contrast
Comparison: None Available.

CLINICAL DATA: Cirrhosis

EXAM:
ULTRASOUND ABDOMEN LIMITED RIGHT UPPER QUADRANT

[Series 1: us abdomen limited · 0.17mm/px · 14 of 46 slices shown]
[im 1/46]
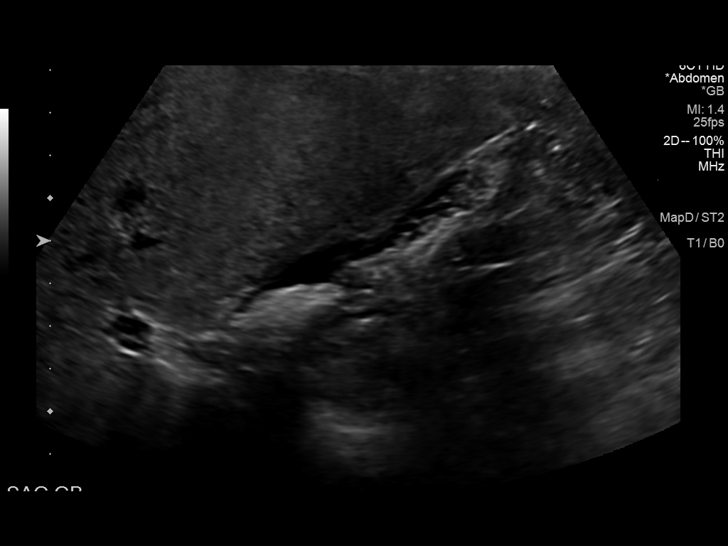
[im 4/46]
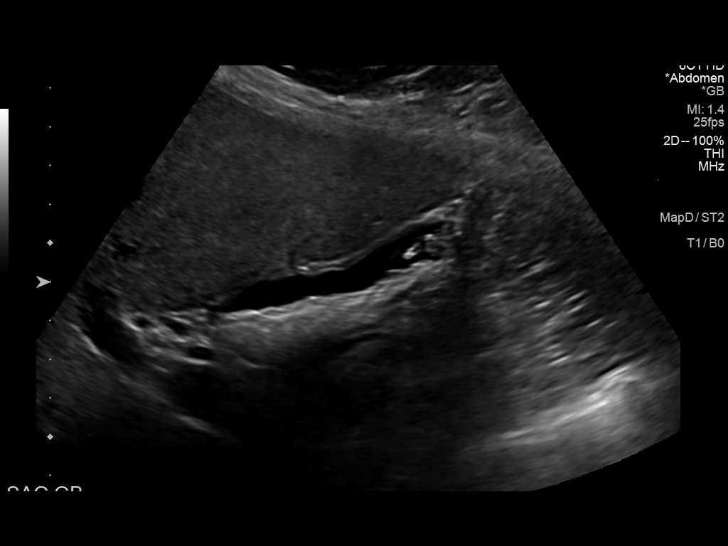
[im 8/46]
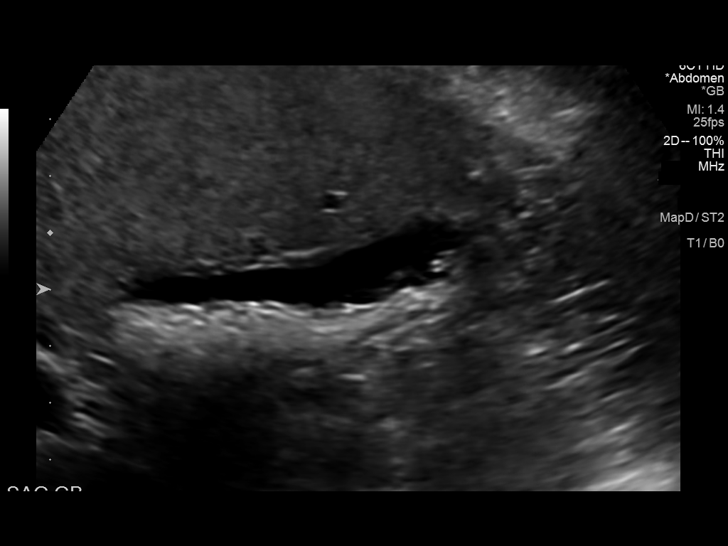
[im 12/46]
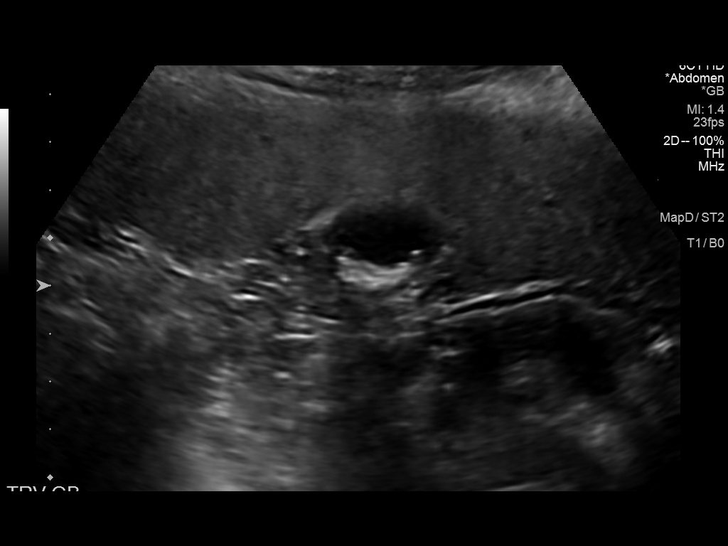
[im 16/46]
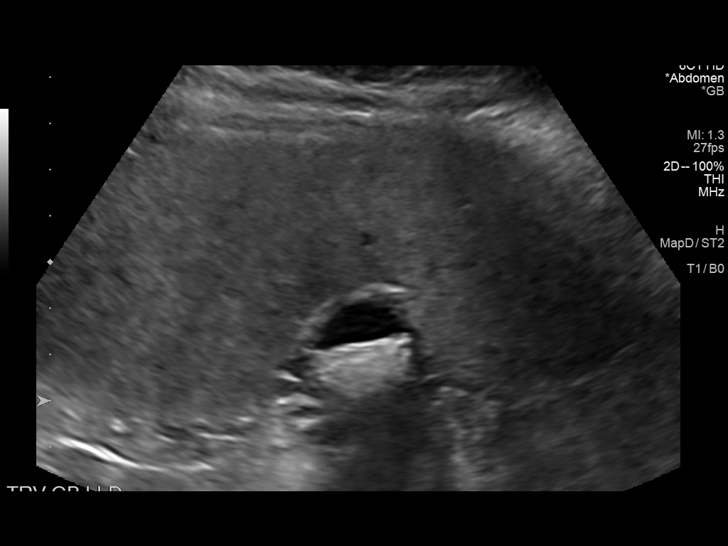
[im 17/46]
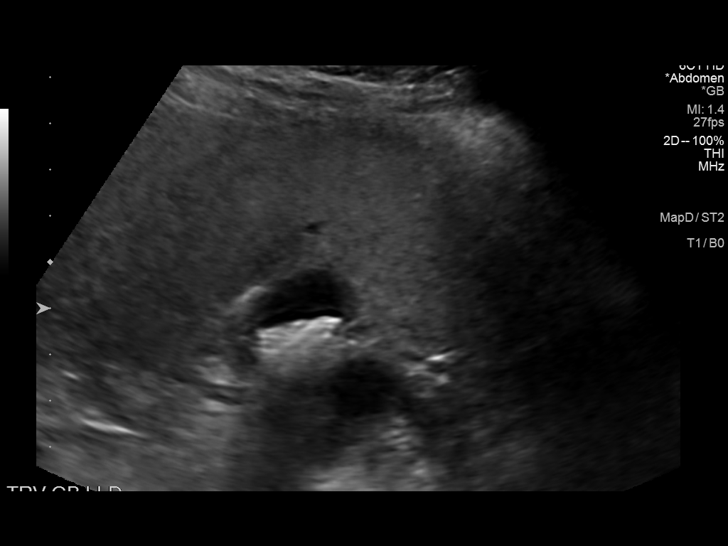
[im 21/46]
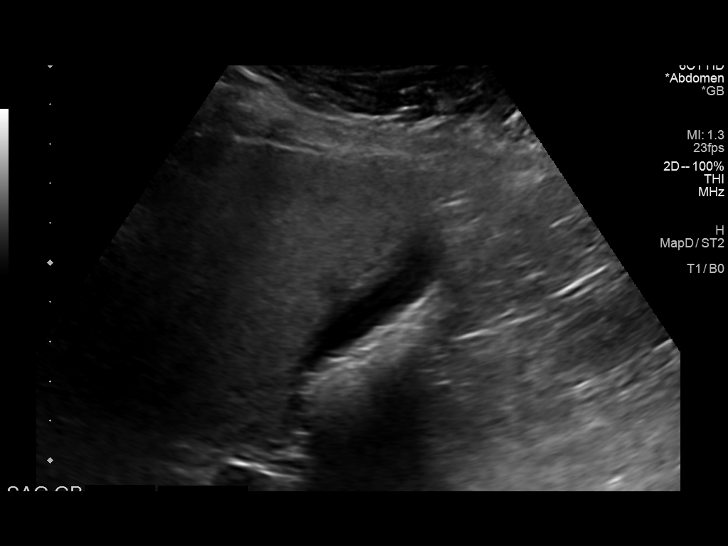
[im 25/46]
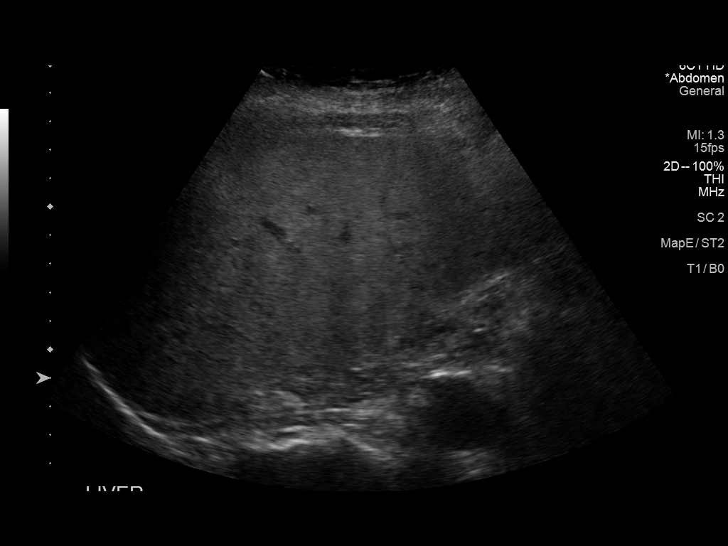
[im 29/46]
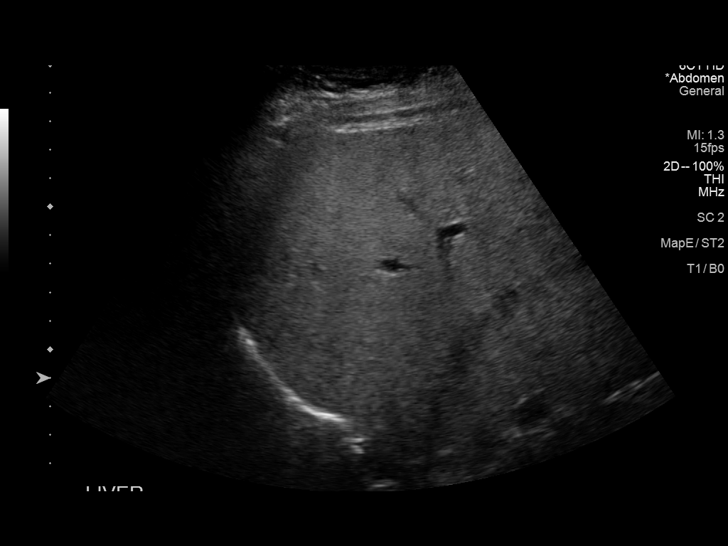
[im 31/46]
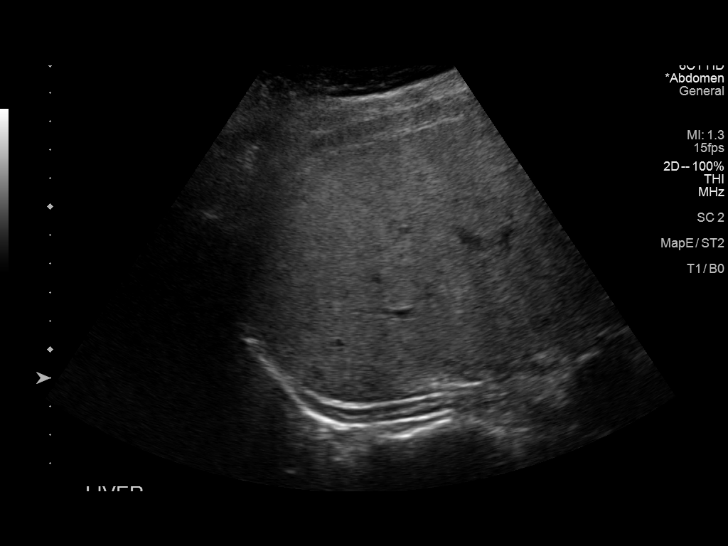
[im 34/46]
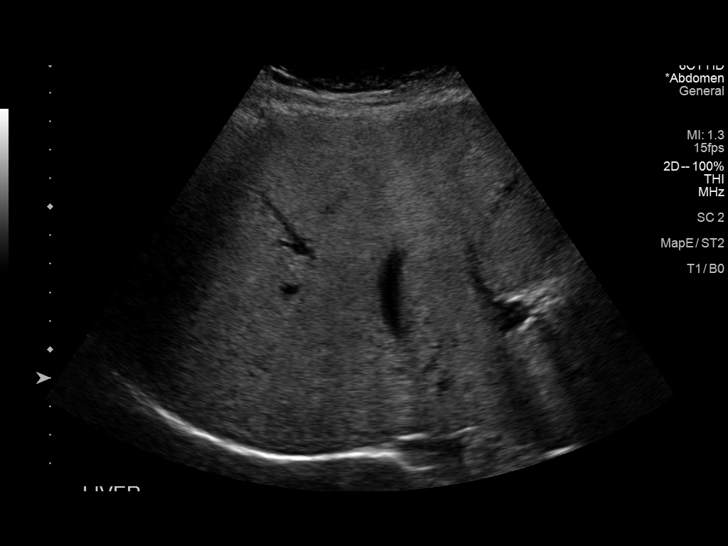
[im 38/46]
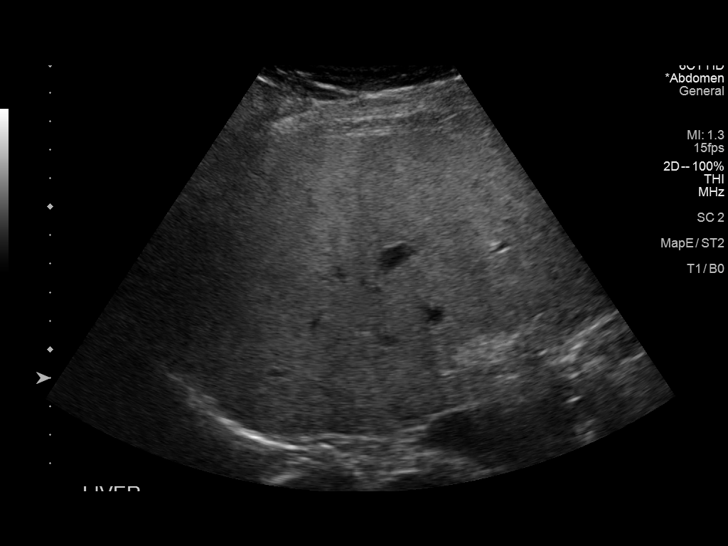
[im 42/46]
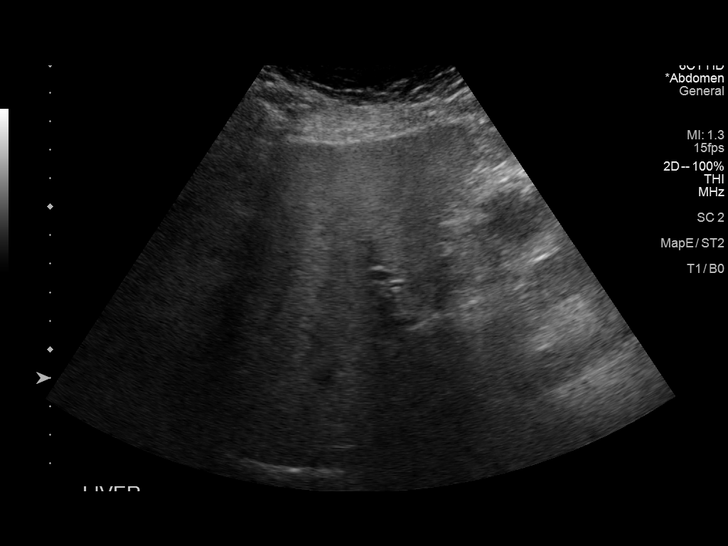
[im 46/46]
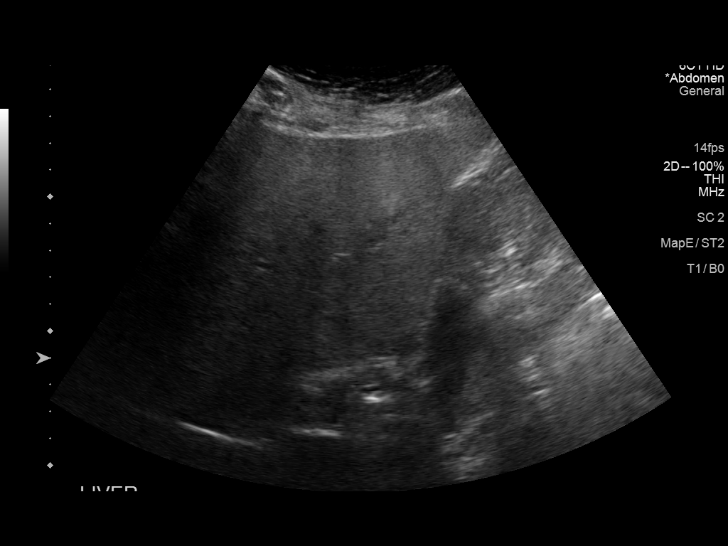

[14 of 25 positions shown; findings below may reference images not displayed]

FINDINGS: Gallbladder:

Contains dependent echogenic shadowing stones and sludge. No
abnormal wall thickening or pericholecystic fluid. Negative
sonographic Murphy's sign.

Common bile duct:

Diameter: 3 mm

Liver:

Inhomogeneous, increased echogenicity of the parenchyma with no
focal mass identified. Portal vein is patent on color Doppler
imaging with normal direction of blood flow towards the liver.

Other: None.
IMPRESSION: 1. Abnormal appearance of the liver parenchyma which may represent
hepatic steatosis and/or other hepatocellular disease. No focal mass
identified.
2. Cholelithiasis and sludge.
